# Patient Record
Sex: Male | Born: 1960 | Race: White | Hispanic: No | Marital: Married | Smoking: Never smoker
Health system: Southern US, Community
[De-identification: ages and names within clinical notes are randomized; demographics above are authoritative.]

## PROBLEM LIST (undated history)

## (undated) DIAGNOSIS — I2510 Atherosclerotic heart disease of native coronary artery without angina pectoris: Secondary | ICD-10-CM

## (undated) DIAGNOSIS — Z789 Other specified health status: Secondary | ICD-10-CM

## (undated) DIAGNOSIS — I5032 Chronic diastolic (congestive) heart failure: Secondary | ICD-10-CM

## (undated) DIAGNOSIS — I358 Other nonrheumatic aortic valve disorders: Secondary | ICD-10-CM

## (undated) DIAGNOSIS — I4819 Other persistent atrial fibrillation: Secondary | ICD-10-CM

## (undated) DIAGNOSIS — I429 Cardiomyopathy, unspecified: Secondary | ICD-10-CM

## (undated) DIAGNOSIS — E785 Hyperlipidemia, unspecified: Secondary | ICD-10-CM

## (undated) DIAGNOSIS — I340 Nonrheumatic mitral (valve) insufficiency: Secondary | ICD-10-CM

## (undated) DIAGNOSIS — I10 Essential (primary) hypertension: Secondary | ICD-10-CM

## (undated) DIAGNOSIS — F1010 Alcohol abuse, uncomplicated: Secondary | ICD-10-CM

## (undated) DIAGNOSIS — I77810 Thoracic aortic ectasia: Secondary | ICD-10-CM

## (undated) DIAGNOSIS — I4892 Unspecified atrial flutter: Secondary | ICD-10-CM

## (undated) HISTORY — DX: Atherosclerotic heart disease of native coronary artery without angina pectoris: I25.10

## (undated) HISTORY — DX: Essential (primary) hypertension: I10

## (undated) HISTORY — DX: Nonrheumatic mitral (valve) insufficiency: I34.0

## (undated) HISTORY — DX: Cardiomyopathy, unspecified: I42.9

## (undated) HISTORY — DX: Other specified health status: Z78.9

## (undated) HISTORY — DX: Chronic diastolic (congestive) heart failure: I50.32

## (undated) HISTORY — DX: Other nonrheumatic aortic valve disorders: I35.8

## (undated) HISTORY — DX: Unspecified atrial flutter: I48.92

## (undated) HISTORY — DX: Hyperlipidemia, unspecified: E78.5

## (undated) HISTORY — DX: Alcohol abuse, uncomplicated: F10.10

## (undated) HISTORY — DX: Thoracic aortic ectasia: I77.810

## (undated) HISTORY — DX: Other persistent atrial fibrillation: I48.19

## (undated) HISTORY — PX: TONSILLECTOMY AND ADENOIDECTOMY: SHX28

---

## 2009-12-24 DIAGNOSIS — I10 Essential (primary) hypertension: Secondary | ICD-10-CM | POA: Insufficient documentation

## 2013-08-05 ENCOUNTER — Ambulatory Visit: Payer: Self-pay | Admitting: Family Medicine

## 2013-10-31 ENCOUNTER — Ambulatory Visit: Payer: Self-pay | Admitting: Unknown Physician Specialty

## 2013-10-31 DIAGNOSIS — Z86010 Personal history of colonic polyps: Secondary | ICD-10-CM | POA: Insufficient documentation

## 2013-10-31 HISTORY — PX: COLONOSCOPY: SHX174

## 2013-10-31 LAB — HM COLONOSCOPY

## 2013-11-02 LAB — PATHOLOGY REPORT

## 2014-02-28 IMAGING — CR DG LUMBAR SPINE 2-3V
1 series · 3 of 3 positions shown · non-contrast
Comparison: None.

CLINICAL DATA: Low back pain, worse after several hours of working

EXAM:
LUMBAR SPINE - 2-3 VIEW

[Series 1: ap · 0.17mm/px · 3 of 3 slices shown]
[im 1/3]
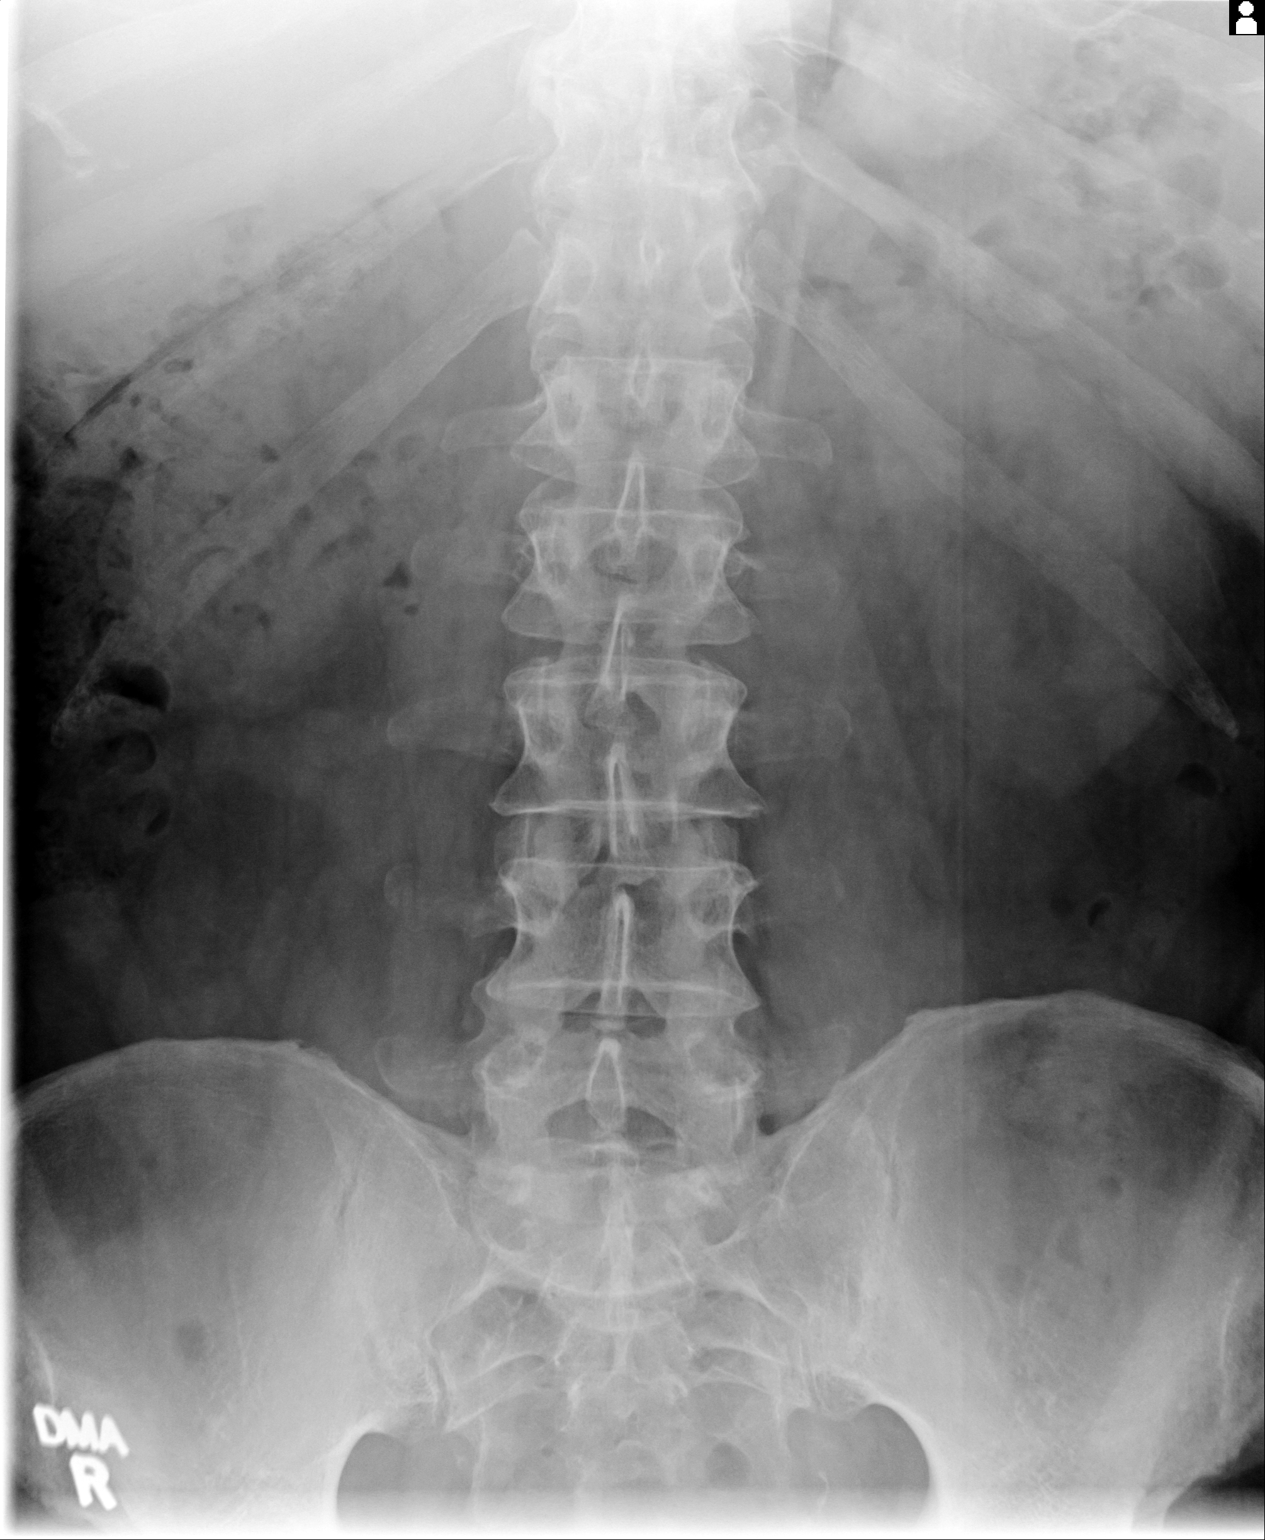
[im 2/3]
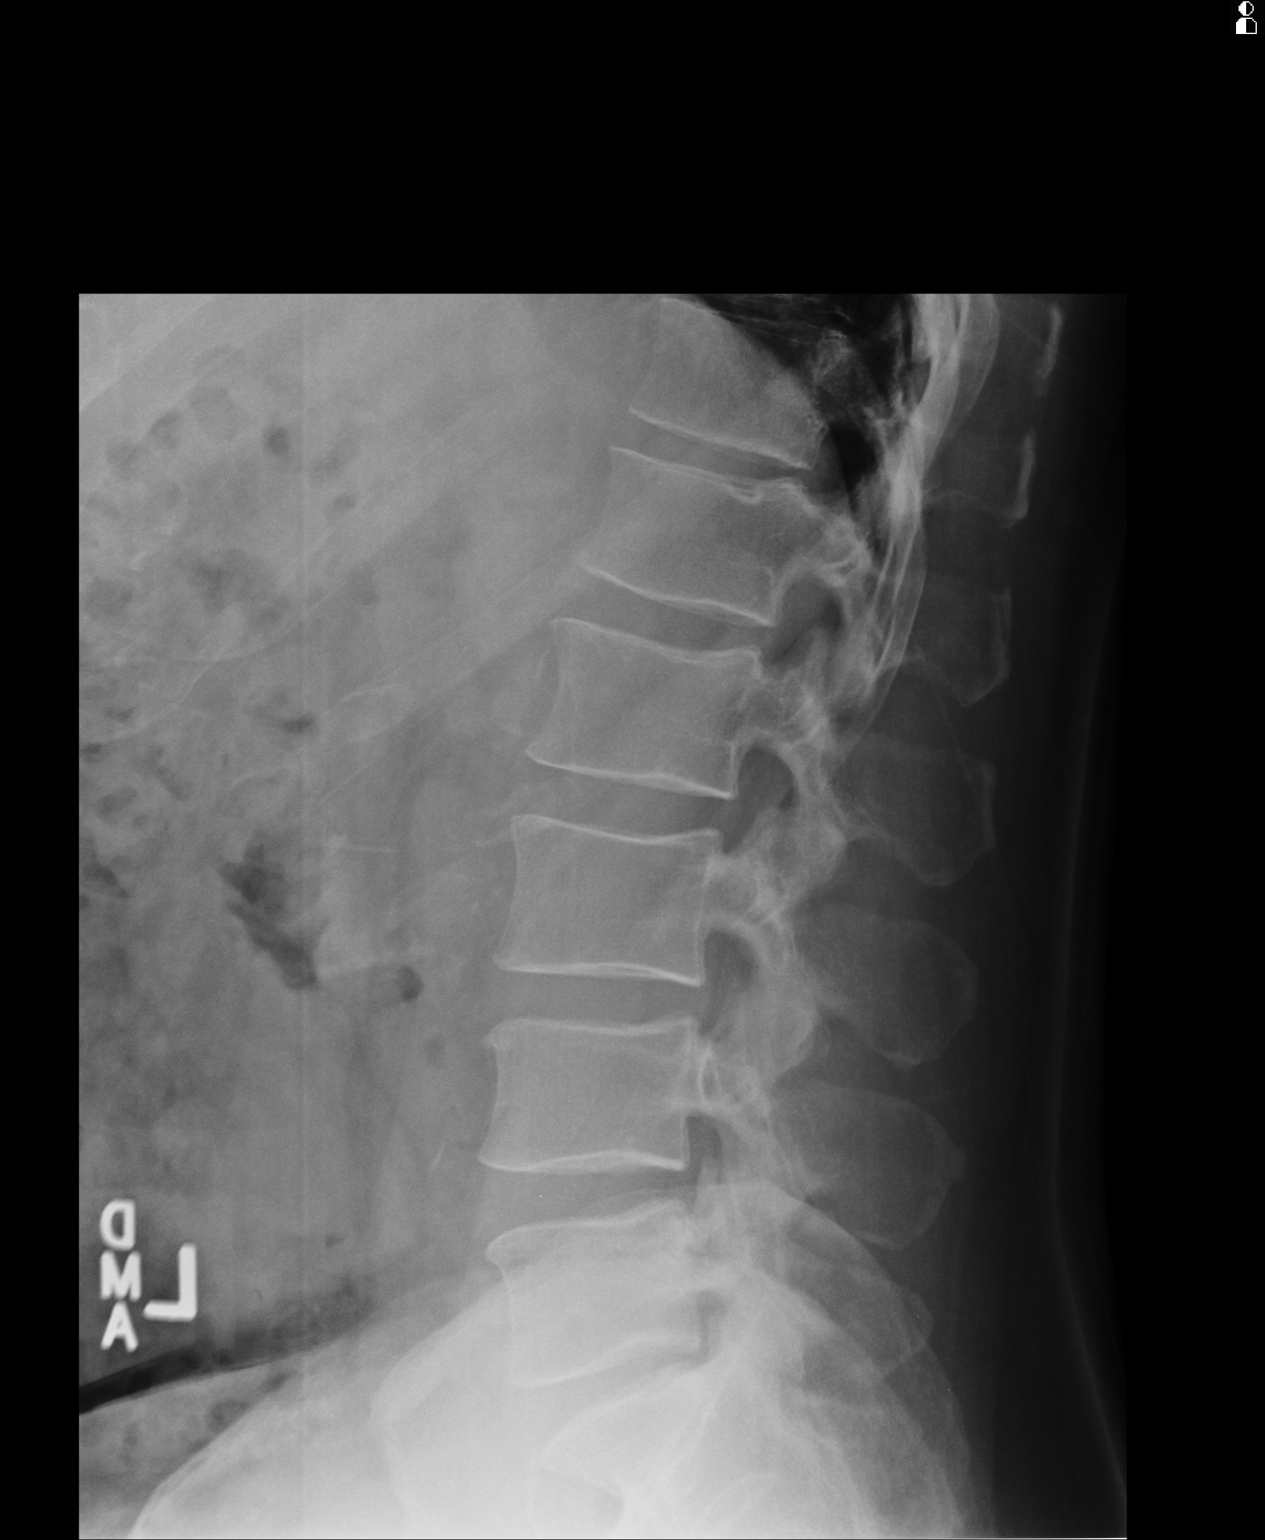
[im 3/3]
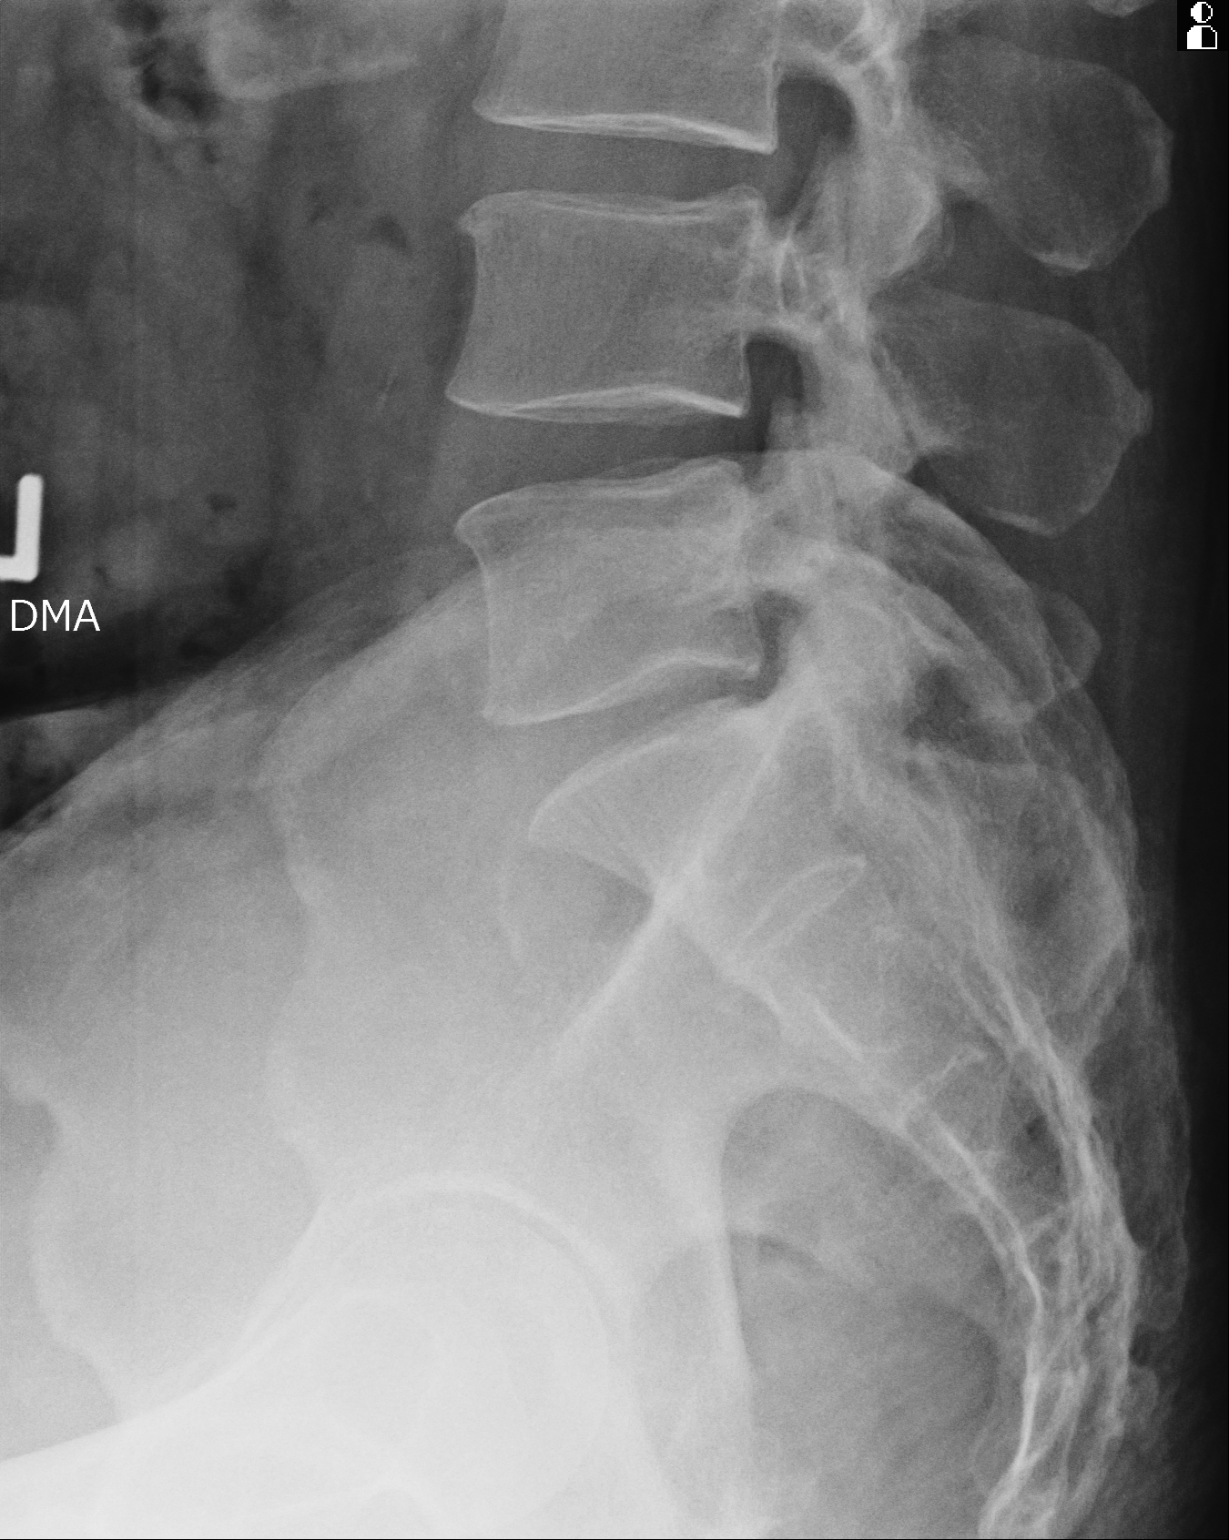

[3 of 3 positions shown; findings below may reference images not displayed]

FINDINGS: There are 5 non rib-bearing lumbar type vertebral bodies.

There is minimal straightening of the expected lumbar lordosis. No
anterolisthesis or retrolisthesis.

Lumbar vertebral body heights are preserved.

There is mild multilevel lumbar spine DDD, worse at L3-L4 with disc
space height loss, endplate irregularity and internally directed
osteophytosis.

Limited visualization of the bilateral SI joints is normal.

Moderate colonic stool burden without evidence of obstruction.
Vascular calcifications. Regional soft tissues are otherwise normal.
IMPRESSION: 1. Mild straightening of expected lumbar lordosis, nonspecific
though could be seen in the setting of muscle spasm.
2. Mild multilevel lumbar spine DDD, worse at L3-L4.

## 2015-02-13 ENCOUNTER — Other Ambulatory Visit: Payer: Self-pay | Admitting: Family Medicine

## 2015-02-13 NOTE — Telephone Encounter (Signed)
Office visit to extract his chart and to f/u hypertension.

## 2015-04-03 NOTE — Telephone Encounter (Signed)
Patient is requesting refills on HCTZ and Bisoprolol-HCTZ. Patient will schedule a follow up visit in November.

## 2015-10-17 ENCOUNTER — Other Ambulatory Visit: Payer: Self-pay | Admitting: Family Medicine

## 2015-10-17 DIAGNOSIS — I10 Essential (primary) hypertension: Secondary | ICD-10-CM

## 2015-10-17 MED ORDER — HYDROCHLOROTHIAZIDE 12.5 MG PO CAPS
12.5000 mg | ORAL_CAPSULE | Freq: Every day | ORAL | Status: DC
Start: 2015-10-17 — End: 2016-02-14

## 2015-10-17 MED ORDER — BISOPROLOL-HYDROCHLOROTHIAZIDE 10-6.25 MG PO TABS: 1.0000 | ORAL_TABLET | Freq: Every day | ORAL | Status: DC

## 2016-02-13 ENCOUNTER — Encounter: Payer: Self-pay | Admitting: Family Medicine

## 2016-02-13 DIAGNOSIS — G8929 Other chronic pain: Secondary | ICD-10-CM

## 2016-02-13 DIAGNOSIS — M545 Low back pain, unspecified: Secondary | ICD-10-CM | POA: Insufficient documentation

## 2016-02-13 DIAGNOSIS — D2239 Melanocytic nevi of other parts of face: Secondary | ICD-10-CM | POA: Insufficient documentation

## 2016-02-13 DIAGNOSIS — L918 Other hypertrophic disorders of the skin: Secondary | ICD-10-CM

## 2016-02-13 DIAGNOSIS — M5440 Lumbago with sciatica, unspecified side: Principal | ICD-10-CM

## 2016-02-13 DIAGNOSIS — M542 Cervicalgia: Secondary | ICD-10-CM | POA: Insufficient documentation

## 2016-02-14 ENCOUNTER — Ambulatory Visit (INDEPENDENT_AMBULATORY_CARE_PROVIDER_SITE_OTHER): Payer: Self-pay | Admitting: Family Medicine

## 2016-02-14 ENCOUNTER — Encounter: Payer: Self-pay | Admitting: Family Medicine

## 2016-02-14 VITALS — BP 172/98 | HR 64 | Temp 97.8°F | Resp 16 | Ht 74.2 in | Wt 250.0 lb

## 2016-02-14 DIAGNOSIS — E785 Hyperlipidemia, unspecified: Secondary | ICD-10-CM

## 2016-02-14 DIAGNOSIS — Z658 Other specified problems related to psychosocial circumstances: Secondary | ICD-10-CM

## 2016-02-14 DIAGNOSIS — I10 Essential (primary) hypertension: Secondary | ICD-10-CM

## 2016-02-14 DIAGNOSIS — F439 Reaction to severe stress, unspecified: Secondary | ICD-10-CM

## 2016-02-14 MED ORDER — AMLODIPINE BESYLATE 10 MG PO TABS: 10.0000 mg | ORAL_TABLET | Freq: Every day | ORAL | Status: DC

## 2016-02-14 MED ORDER — BISOPROLOL-HYDROCHLOROTHIAZIDE 10-6.25 MG PO TABS: 1.0000 | ORAL_TABLET | Freq: Every day | ORAL | Status: DC

## 2016-02-14 MED ORDER — CLONAZEPAM 0.5 MG PO TABS: ORAL_TABLET | ORAL | Status: DC

## 2016-02-14 NOTE — Patient Instructions (Signed)
We will call you with the lab results. Please return for office visit before you run out of the new bp medication.
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
## 2016-03-06 ENCOUNTER — Ambulatory Visit (INDEPENDENT_AMBULATORY_CARE_PROVIDER_SITE_OTHER): Payer: Self-pay | Admitting: Family Medicine

## 2016-03-06 ENCOUNTER — Encounter: Payer: Self-pay | Admitting: Family Medicine

## 2016-03-06 VITALS — BP 128/84 | HR 70 | Temp 98.2°F | Resp 16 | Ht 74.0 in | Wt 245.0 lb

## 2016-03-06 DIAGNOSIS — I10 Essential (primary) hypertension: Secondary | ICD-10-CM

## 2016-03-06 DIAGNOSIS — Z658 Other specified problems related to psychosocial circumstances: Secondary | ICD-10-CM

## 2016-03-06 DIAGNOSIS — F439 Reaction to severe stress, unspecified: Secondary | ICD-10-CM

## 2016-03-06 MED ORDER — AMLODIPINE BESYLATE 10 MG PO TABS: 10.0000 mg | ORAL_TABLET | Freq: Every day | ORAL | Status: DC

## 2016-03-06 NOTE — Patient Instructions (Signed)
Consider Cymbalta for anxiety and chronic pain. Try 1/2 pill of clonazepam for day time use as needed for stress. Call me when you need refill of clonazepam.

## 2016-03-06 NOTE — Progress Notes (Signed)
Subjective:     Patient ID: Stephen [name], male   DOB: 1960-12-07, 55 y.o.   MRN: [id number]  HPI  Chief Complaint  Patient presents with  . Hypertension  States he is tolerating addition of amlodipine well without leg swelling. Also here in f/u of situational stress. States he has used the clonazepam at night and has slept better. Has not tried during the day out of concerns for sleepiness.   Review of Systems     Objective:   Physical Exam  Constitutional: He appears well-developed and well-nourished. No distress.  Cardiovascular: Normal rate and regular rhythm.   Pulmonary/Chest: Breath sounds normal.  Musculoskeletal: He exhibits no edema (of lower extremities).  Psychiatric: He has a normal mood and affect. His behavior is normal.       Assessment:    1. Essential (primary) hypertension: continue bisoprolol - amLODipine (NORVASC) 10 MG tablet; Take 1 tablet (10 mg total) by mouth daily.  Dispense: 30 tablet; Refill: 5  2. Situational stress: Continue clonazepam; to try 1/2 pill during the day as needed.    Plan:    Consider Cymbalta for anxiety and chronic neck/back pain. He wishes to research it and will let me know if he wishes to try.

## 2016-03-07 ENCOUNTER — Other Ambulatory Visit: Payer: Self-pay | Admitting: Family Medicine

## 2016-03-07 DIAGNOSIS — F439 Reaction to severe stress, unspecified: Secondary | ICD-10-CM

## 2016-03-07 MED ORDER — CLONAZEPAM 0.5 MG PO TABS
ORAL_TABLET | ORAL | Status: DC
Start: 2016-03-07 — End: 2016-11-04

## 2016-03-07 NOTE — Telephone Encounter (Signed)
Pt contacted office for refill request on the following medications: clonazePAM (KLONOPIN) 0.5 MG tablet to [facility] on [address] Last RX: 02/14/16 Last OV: 03/06/16 Pt stated that he came in today to advise that he did well taking the medication during the day and wanted to go ahead and request a refill. Please advise. Thanks TNP

## 2016-03-07 NOTE — Telephone Encounter (Signed)
Please call in clonazepam as I have updated in the EMR

## 2016-03-07 NOTE — Telephone Encounter (Signed)
Called into pharmacy and advised pt. Stephen [name], CMA

## 2016-03-08 LAB — COMPREHENSIVE METABOLIC PANEL
ALT: 21 IU/L (ref 0–44)
AST: 17 IU/L (ref 0–40)
Albumin/Globulin Ratio: 1.5 (ref 1.2–2.2)
Albumin: 4.2 g/dL (ref 3.5–5.5)
Alkaline Phosphatase: 59 IU/L (ref 39–117)
BUN/Creatinine Ratio: 18 (ref 9–20)
BUN: 16 mg/dL (ref 6–24)
Bilirubin Total: 0.4 mg/dL (ref 0.0–1.2)
CALCIUM: 9.2 mg/dL (ref 8.7–10.2)
CO2: 24 mmol/L (ref 18–29)
CREATININE: 0.87 mg/dL (ref 0.76–1.27)
Chloride: 100 mmol/L (ref 96–106)
GFR calc Af Amer: 114 mL/min/{1.73_m2} (ref >59)
GFR, EST NON AFRICAN AMERICAN: 99 mL/min/{1.73_m2} (ref >59)
Globulin, Total: 2.8 g/dL (ref 1.5–4.5)
Glucose: 93 mg/dL (ref 65–99)
Potassium: 4.5 mmol/L (ref 3.5–5.2)
Sodium: 140 mmol/L (ref 134–144)
TOTAL PROTEIN: 7 g/dL (ref 6.0–8.5)

## 2016-03-08 LAB — LIPID PANEL
CHOL/HDL RATIO: 4.7 ratio (ref 0.0–5.0)
Cholesterol, Total: 228 mg/dL — ABNORMAL HIGH (ref 100–199)
HDL: 49 mg/dL (ref >39)
LDL CALC: 129 mg/dL — AB (ref 0–99)
TRIGLYCERIDES: 249 mg/dL — AB (ref 0–149)
VLDL CHOLESTEROL CAL: 50 mg/dL — AB (ref 5–40)

## 2016-08-12 ENCOUNTER — Other Ambulatory Visit: Payer: Self-pay | Admitting: Family Medicine

## 2016-08-12 DIAGNOSIS — I10 Essential (primary) hypertension: Secondary | ICD-10-CM

## 2016-08-24 ENCOUNTER — Other Ambulatory Visit: Payer: Self-pay | Admitting: Family Medicine

## 2016-08-24 DIAGNOSIS — I10 Essential (primary) hypertension: Secondary | ICD-10-CM

## 2016-11-04 ENCOUNTER — Other Ambulatory Visit: Payer: Self-pay | Admitting: Family Medicine

## 2016-11-04 DIAGNOSIS — F439 Reaction to severe stress, unspecified: Secondary | ICD-10-CM

## 2016-11-05 NOTE — Telephone Encounter (Signed)
RX called in at [facility] pharmacy  

## 2017-02-21 ENCOUNTER — Other Ambulatory Visit: Payer: Self-pay | Admitting: Family Medicine

## 2017-02-21 DIAGNOSIS — I10 Essential (primary) hypertension: Secondary | ICD-10-CM

## 2017-04-22 ENCOUNTER — Other Ambulatory Visit: Payer: Self-pay | Admitting: Family Medicine

## 2017-04-22 DIAGNOSIS — I10 Essential (primary) hypertension: Secondary | ICD-10-CM

## 2017-05-11 ENCOUNTER — Other Ambulatory Visit: Payer: Self-pay | Admitting: Family Medicine

## 2017-05-11 DIAGNOSIS — F439 Reaction to severe stress, unspecified: Secondary | ICD-10-CM

## 2017-06-22 ENCOUNTER — Encounter: Payer: Self-pay | Admitting: Family Medicine

## 2017-06-22 ENCOUNTER — Ambulatory Visit (INDEPENDENT_AMBULATORY_CARE_PROVIDER_SITE_OTHER): Payer: 59 | Admitting: Family Medicine

## 2017-06-22 VITALS — BP 160/104 | HR 77 | Temp 98.6°F | Resp 16 | Wt 247.8 lb

## 2017-06-22 DIAGNOSIS — Z125 Encounter for screening for malignant neoplasm of prostate: Secondary | ICD-10-CM | POA: Diagnosis not present

## 2017-06-22 DIAGNOSIS — M7062 Trochanteric bursitis, left hip: Secondary | ICD-10-CM

## 2017-06-22 DIAGNOSIS — G8929 Other chronic pain: Secondary | ICD-10-CM | POA: Diagnosis not present

## 2017-06-22 DIAGNOSIS — E782 Mixed hyperlipidemia: Secondary | ICD-10-CM | POA: Diagnosis not present

## 2017-06-22 DIAGNOSIS — M545 Low back pain, unspecified: Secondary | ICD-10-CM | POA: Insufficient documentation

## 2017-06-22 DIAGNOSIS — Z23 Encounter for immunization: Secondary | ICD-10-CM

## 2017-06-22 DIAGNOSIS — I10 Essential (primary) hypertension: Secondary | ICD-10-CM | POA: Diagnosis not present

## 2017-06-22 LAB — LIPID PANEL
Cholesterol: 200 mg/dL — ABNORMAL HIGH (ref <200)
HDL: 48 mg/dL (ref >40)
LDL CHOLESTEROL (CALC): 118 mg/dL — AB
NON-HDL CHOLESTEROL (CALC): 152 mg/dL — AB (ref <130)
TRIGLYCERIDES: 224 mg/dL — AB (ref <150)
Total CHOL/HDL Ratio: 4.2 (calc) (ref <5.0)

## 2017-06-22 LAB — COMPLETE METABOLIC PANEL WITH GFR
AG Ratio: 1.4 (calc) (ref 1.0–2.5)
ALT: 23 U/L (ref 9–46)
AST: 15 U/L (ref 10–35)
Albumin: 4 g/dL (ref 3.6–5.1)
Alkaline phosphatase (APISO): 55 U/L (ref 40–115)
BUN: 12 mg/dL (ref 7–25)
CALCIUM: 9 mg/dL (ref 8.6–10.3)
CO2: 31 mmol/L (ref 20–32)
CREATININE: 0.79 mg/dL (ref 0.70–1.33)
Chloride: 103 mmol/L (ref 98–110)
GFR, EST AFRICAN AMERICAN: 117 mL/min/{1.73_m2} (ref >=60)
GFR, EST NON AFRICAN AMERICAN: 101 mL/min/{1.73_m2} (ref >=60)
GLUCOSE: 97 mg/dL (ref 65–99)
Globulin: 2.9 g/dL (calc) (ref 1.9–3.7)
Potassium: 4 mmol/L (ref 3.5–5.3)
Sodium: 138 mmol/L (ref 135–146)
TOTAL PROTEIN: 6.9 g/dL (ref 6.1–8.1)
Total Bilirubin: 0.6 mg/dL (ref 0.2–1.2)

## 2017-06-22 LAB — PSA: PSA: 0.5 ng/mL (ref <=4.0)

## 2017-06-22 MED ORDER — DULOXETINE HCL 30 MG PO CPEP: 30.0000 mg | ORAL_CAPSULE | Freq: Every day | ORAL | 1 refills | Status: DC

## 2017-06-22 MED ORDER — BISOPROLOL-HYDROCHLOROTHIAZIDE 10-6.25 MG PO TABS: ORAL_TABLET | ORAL | 3 refills | Status: DC

## 2017-06-22 NOTE — Patient Instructions (Signed)
We will call you with the lab results. 

## 2017-06-22 NOTE — Progress Notes (Signed)
Subjective:     Patient ID: Stephen [name], male   DOB: Feb 03, 1961, 56 y.o.   MRN: [id number]  HPI  Chief Complaint  Patient presents with  . Hypertension    Patient returns to [facility] today for follow up visit, patient was last seen on 03/06/16 b/p at visit was 128/84. Patient report systolic readings outside  has been between 295-284 and diastolic readins in the 13K. He reports good compliance on mediation but would like changing medication to help his blood pressure improve.   . Stress    Pateint returns for follow up he was last seen 03/06/16, patient reports good compliance on Clonazepam and states that he takes medication at night but it causes him to be drowsy. Patient admits that he is under a lot of stress from work and would like to discuss medication he can take during the daytime.  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    Review of Systems  Respiratory: Negative for shortness of breath.   Cardiovascular: Negative for chest pain and palpitations.  Musculoskeletal: Positive for back pain (chronic midline low back pain. Uses clonazepam at night to help sleep due to pain in neck and back. Prior x-rays of neck and back c/w DDD.).       Also reports chronic right sided hip pain; "I can't sleep on my left side." Will take an occasional ibuprofen for his chronic pains.  Neurological: Positive for headaches (chronic mild headaches; rare migraines).       Objective:   Physical Exam  Constitutional: He appears well-developed and well-nourished. No distress.  Cardiovascular: Normal rate and regular rhythm.   Pulmonary/Chest: Breath sounds normal.  Musculoskeletal: He exhibits no edema.  Tender over his left trochanteric area       Assessment:    1. Need for influenza vaccination - Flu Vaccine QUAD 36+ mos IM  2. Mixed hyperlipidemia - Lipid panel  3. Chronic midline low back pain without sciatica - DULoxetine (CYMBALTA) 30 MG capsule; Take 1 capsule (30 mg  total) by mouth daily.  Dispense: 30 capsule; Refill: 1  4. Trochanteric bursitis of left hip - Ambulatory referral to Orthopedic Surgery  5. Essential hypertension - bisoprolol-hydrochlorothiazide (ZIAC) 10-6.25 MG tablet; Two tablets daily  Dispense: 180 tablet; Refill: 3 - COMPLETE METABOLIC PANEL WITH GFR             6. Screening for prostate cancer - PSA    Plan:    Further f/u pending lab work and in 3 weeks.

## 2017-06-24 ENCOUNTER — Telehealth: Payer: Self-pay

## 2017-06-24 NOTE — Telephone Encounter (Signed)
-----   Message from Carmon Ginsberg, [place] sent at 06/23/2017  7:34 AM EDT ----- Labs are ok-mildly elevated cholesterol. Your cardiovascular risk will go down when we get control of your blood pressure.

## 2017-06-24 NOTE — Telephone Encounter (Signed)
[id number] 

## 2017-06-30 NOTE — Telephone Encounter (Signed)
[id number] 

## 2017-07-02 NOTE — Telephone Encounter (Signed)
[id number] 

## 2017-07-06 NOTE — Telephone Encounter (Signed)
[id number] 

## 2017-07-08 NOTE — Telephone Encounter (Signed)
Letter mailed to patients home. KW 

## 2017-07-13 ENCOUNTER — Encounter: Payer: Self-pay | Admitting: Family Medicine

## 2017-07-13 ENCOUNTER — Ambulatory Visit (INDEPENDENT_AMBULATORY_CARE_PROVIDER_SITE_OTHER): Payer: 59 | Admitting: Family Medicine

## 2017-07-13 VITALS — BP 134/86 | HR 59 | Temp 98.0°F | Resp 16 | Wt 244.0 lb

## 2017-07-13 DIAGNOSIS — I10 Essential (primary) hypertension: Secondary | ICD-10-CM | POA: Diagnosis not present

## 2017-07-13 MED ORDER — AMLODIPINE BESYLATE 10 MG PO TABS: 10.0000 mg | ORAL_TABLET | Freq: Every day | ORAL | 3 refills | Status: DC

## 2017-07-13 NOTE — Progress Notes (Signed)
Subjective:     Patient ID: Stephen [name], male   DOB: March 14, 1961, 56 y.o.   MRN: [id number]  HPI  Chief Complaint  Patient presents with  . Hypertension    Patient returns to office today for 3 week follow up, last office visit was 06/22/17 blood pressure in house was 160/104. Patient was encouraged to continue Bisoprolol-HCTZ 10-6.25mg , patient reports good compliance and tolerance on medication.   Wishes one year supply of medication. 10 year c.v.risk recalculated as 7.3%.   Review of Systems     Objective:   Physical Exam  Constitutional: He appears well-developed and well-nourished. No distress.  Cardiovascular: Normal rate and regular rhythm.  Pulmonary/Chest: Breath sounds normal.  Musculoskeletal: He exhibits no edema (of lower extremities).       Assessment:    1. Essential (primary) hypertension: continue increased dose of bisoprolol - amLODipine (NORVASC) 10 MG tablet; Take 1 tablet (10 mg total) daily by mouth.  Dispense: 90 tablet; Refill: 3    Plan:    follow up in one year or as needed.

## 2017-08-18 ENCOUNTER — Other Ambulatory Visit: Payer: Self-pay | Admitting: Family Medicine

## 2017-08-18 DIAGNOSIS — M545 Low back pain, unspecified: Secondary | ICD-10-CM

## 2017-08-18 DIAGNOSIS — G8929 Other chronic pain: Secondary | ICD-10-CM

## 2017-09-07 ENCOUNTER — Other Ambulatory Visit: Payer: Self-pay | Admitting: Family Medicine

## 2017-09-07 DIAGNOSIS — F439 Reaction to severe stress, unspecified: Secondary | ICD-10-CM

## 2017-09-13 ENCOUNTER — Other Ambulatory Visit: Payer: Self-pay | Admitting: Family Medicine

## 2017-09-13 DIAGNOSIS — F439 Reaction to severe stress, unspecified: Secondary | ICD-10-CM

## 2017-09-14 NOTE — Telephone Encounter (Signed)
Please check on this, it should have been called in on 1/7.

## 2017-09-14 NOTE — Telephone Encounter (Signed)
Called pharmacy, and verbally gave order on phone for prescription. KW

## 2017-10-17 ENCOUNTER — Other Ambulatory Visit: Payer: Self-pay | Admitting: Family Medicine

## 2017-10-17 DIAGNOSIS — F439 Reaction to severe stress, unspecified: Secondary | ICD-10-CM

## 2017-10-17 DIAGNOSIS — I10 Essential (primary) hypertension: Secondary | ICD-10-CM

## 2017-10-18 ENCOUNTER — Other Ambulatory Visit: Payer: Self-pay | Admitting: Family Medicine

## 2017-10-18 DIAGNOSIS — F439 Reaction to severe stress, unspecified: Secondary | ICD-10-CM

## 2017-10-18 MED ORDER — CLONAZEPAM 0.5 MG PO TABS: ORAL_TABLET | ORAL | 0 refills | Status: DC

## 2017-10-18 NOTE — Telephone Encounter (Signed)
This medication refilled with a year's refills on 06/22/17. Please confirm with the pharmacy.

## 2017-10-19 NOTE — Telephone Encounter (Signed)
Disregard refill request I contact [facility] they state that prescription was denied at there location because it is filled at the other [place] on [address] that use to be [address] [facility]. I called patient and left voicemail to notify him that there are now two [place] on [address], and gave him phone number to correct pharmacy where prescription is waiting for pick up. KW

## 2018-01-20 ENCOUNTER — Telehealth: Payer: Self-pay | Admitting: Family Medicine

## 2018-01-20 NOTE — Telephone Encounter (Signed)
Pt is requesting refill on DULoxetine (CYMBALTA) 30 MG capsule and clonazePAM (KLONOPIN) 0.5 MG tablet he would like this sent ot [facility]

## 2018-01-20 NOTE — Telephone Encounter (Signed)
Last OV 07/13/2017 Next OV 05/04/18 Last RF: Duloxetine-08/18/17 and Clonazepam- 10/18/17  Please refer to note on 10/17/17 regarding patient medications

## 2018-01-21 ENCOUNTER — Other Ambulatory Visit: Payer: Self-pay | Admitting: Family Medicine

## 2018-01-21 DIAGNOSIS — M545 Low back pain: Principal | ICD-10-CM

## 2018-01-21 DIAGNOSIS — F439 Reaction to severe stress, unspecified: Secondary | ICD-10-CM

## 2018-01-21 DIAGNOSIS — G8929 Other chronic pain: Secondary | ICD-10-CM

## 2018-01-21 MED ORDER — CLONAZEPAM 0.5 MG PO TABS: ORAL_TABLET | ORAL | 0 refills | Status: DC

## 2018-01-21 MED ORDER — DULOXETINE HCL 30 MG PO CPEP: 30.0000 mg | ORAL_CAPSULE | Freq: Every day | ORAL | 1 refills | Status: DC

## 2018-01-21 NOTE — Telephone Encounter (Signed)
done

## 2018-02-23 ENCOUNTER — Other Ambulatory Visit: Payer: Self-pay | Admitting: Family Medicine

## 2018-02-23 DIAGNOSIS — F439 Reaction to severe stress, unspecified: Secondary | ICD-10-CM

## 2018-02-24 ENCOUNTER — Other Ambulatory Visit: Payer: Self-pay | Admitting: Family Medicine

## 2018-02-24 DIAGNOSIS — F439 Reaction to severe stress, unspecified: Secondary | ICD-10-CM

## 2018-02-24 NOTE — Telephone Encounter (Signed)
Pt needs refill Klonopin  0.5 mg  [facility]  [address]

## 2018-05-04 ENCOUNTER — Ambulatory Visit
Admission: RE | Admit: 2018-05-04 | Discharge: 2018-05-04 | Disposition: A | Discharge status: Home / Self Care | Payer: 59 | Source: Ambulatory Visit | Attending: Family Medicine | Admitting: Family Medicine

## 2018-05-04 ENCOUNTER — Encounter: Payer: Self-pay | Admitting: Family Medicine

## 2018-05-04 ENCOUNTER — Other Ambulatory Visit: Payer: Self-pay | Admitting: Family Medicine

## 2018-05-04 ENCOUNTER — Ambulatory Visit: Payer: 59 | Admitting: Family Medicine

## 2018-05-04 VITALS — BP 130/86 | HR 85 | Temp 98.4°F | Resp 16 | Wt 226.2 lb

## 2018-05-04 DIAGNOSIS — M542 Cervicalgia: Secondary | ICD-10-CM

## 2018-05-04 DIAGNOSIS — F439 Reaction to severe stress, unspecified: Secondary | ICD-10-CM | POA: Diagnosis not present

## 2018-05-04 DIAGNOSIS — M1288 Other specific arthropathies, not elsewhere classified, other specified site: Secondary | ICD-10-CM | POA: Insufficient documentation

## 2018-05-04 DIAGNOSIS — M545 Low back pain: Secondary | ICD-10-CM

## 2018-05-04 DIAGNOSIS — G8929 Other chronic pain: Secondary | ICD-10-CM | POA: Diagnosis not present

## 2018-05-04 DIAGNOSIS — M50322 Other cervical disc degeneration at C5-C6 level: Secondary | ICD-10-CM | POA: Insufficient documentation

## 2018-05-04 DIAGNOSIS — M4802 Spinal stenosis, cervical region: Secondary | ICD-10-CM | POA: Diagnosis not present

## 2018-05-04 MED ORDER — PREDNISONE 20 MG PO TABS: ORAL_TABLET | ORAL | 0 refills | Status: DC

## 2018-05-04 MED ORDER — DULOXETINE HCL 30 MG PO CPEP: ORAL_CAPSULE | ORAL | 0 refills | Status: DC

## 2018-05-04 NOTE — Patient Instructions (Addendum)
We will call you with the x-ray results. Don't take ibuprofen or Aleve while on Prednisone. May take Tylenol up to 3000 mg/day.

## 2018-05-04 NOTE — Progress Notes (Signed)
  Subjective:     Patient ID: Stephen [name], male   DOB: 01-31-1961, 57 y.o.   MRN: [id number] Chief Complaint  Patient presents with  . Hypertension    Patient returns to office today for follow up from 07/13/17, patients blood pressure at last visit was 134/86. Patient reports that he does not feel like his medication has been helping control his blood pressure. Patient reports increased headaches and back pain.  . Stress    Patient returns to office for follow up from 03/06/16 he reports good compliance on Cymbalta and Clonazepam but states that he feels that his body has adjusted to medication and it is not helping with symptom control. Patient reports emotional outburst and crying episodes due to increased stress from back pain causing patient to change jobs.    HPI Reports neck pain radiating down his back to his shoulder blade and numbness and tingling at times in his left arm affecting all of his fingers. 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 Reports compliance with duloxetine. Accompanied by his wife today  Review of Systems     Objective:   Physical Exam  Constitutional: He appears well-developed and well-nourished. No distress.  Musculoskeletal:  Muscle strength in lower extremities 5/5. Cervical ROM limited in extension and lateral movement with increased pain in the left posterior cervical area. Grip strength 5/5.       Assessment:    1. Situational stress: increased Cymbalta to 60 mg/day  2. Neck pain: add prednisone - DG Cervical Spine Complete; Future     Plan:    Further f/u pending x-ray results with probable orthopedic referral. Stop nsaid's while on prednisone. May use Tylenol up to 3000 mg/day.

## 2018-06-01 ENCOUNTER — Other Ambulatory Visit: Payer: Self-pay | Admitting: Family Medicine

## 2018-06-01 ENCOUNTER — Telehealth: Payer: Self-pay | Admitting: Family Medicine

## 2018-06-01 DIAGNOSIS — I10 Essential (primary) hypertension: Secondary | ICD-10-CM

## 2018-06-01 DIAGNOSIS — F439 Reaction to severe stress, unspecified: Secondary | ICD-10-CM

## 2018-06-01 MED ORDER — GABAPENTIN 300 MG PO CAPS: 300.0000 mg | ORAL_CAPSULE | Freq: Three times a day (TID) | ORAL | 0 refills | Status: DC

## 2018-06-01 NOTE — Telephone Encounter (Signed)
Have sent in gabapentin. Awaiting call back on efficacy of prednisone rx.

## 2018-06-01 NOTE — Telephone Encounter (Signed)
Pt calling stating he was seen for neck pain the first of September and was referred to a [facility] and they can't see him until October 30 and would like to know if something can be called in for his pain until he can get to [facility] to be seen. Pt states he has been out of work for 2 days because of his pain. Please advise pt. Thanks CC

## 2018-06-01 NOTE — Telephone Encounter (Signed)
Please advise. KW 

## 2018-06-01 NOTE — Telephone Encounter (Signed)
Pt is checking back on the status of some pain medication he is needing to go back to work. Pt has been out now for 2 days.  Pt is afraid of loosing job due to pain and not being able to be seen by referred doctor until the end of the month.  Please call pt back to let him know asap.  Thanks, [employer]

## 2018-06-01 NOTE — Telephone Encounter (Signed)
[id number] 

## 2018-06-02 NOTE — Telephone Encounter (Signed)
See below

## 2018-06-02 NOTE — Telephone Encounter (Signed)
Patient advised he states that he took the prednisone but it did not help, patient advised to start Gabapentin. KW

## 2018-06-02 NOTE — Telephone Encounter (Signed)
[id number] 

## 2018-06-29 ENCOUNTER — Other Ambulatory Visit: Payer: Self-pay | Admitting: Family Medicine

## 2018-06-29 DIAGNOSIS — I10 Essential (primary) hypertension: Secondary | ICD-10-CM

## 2018-07-02 ENCOUNTER — Other Ambulatory Visit: Payer: Self-pay | Admitting: Family Medicine

## 2018-08-02 ENCOUNTER — Other Ambulatory Visit: Payer: Self-pay | Admitting: Family Medicine

## 2018-08-02 DIAGNOSIS — M545 Low back pain, unspecified: Secondary | ICD-10-CM

## 2018-08-02 DIAGNOSIS — G8929 Other chronic pain: Secondary | ICD-10-CM

## 2018-11-01 ENCOUNTER — Other Ambulatory Visit: Payer: Self-pay | Admitting: Family Medicine

## 2018-11-01 DIAGNOSIS — F439 Reaction to severe stress, unspecified: Secondary | ICD-10-CM

## 2018-11-01 NOTE — Telephone Encounter (Signed)
[facility] faxed refill request for the following medications:  clonazePAM (KLONOPIN) 0.5 MG tablet   Date written: 06/01/2018  Last dispensed: 10/04/2018  Please advise.

## 2018-11-02 MED ORDER — CLONAZEPAM 0.5 MG PO TABS: 0.2500 mg | ORAL_TABLET | Freq: Two times a day (BID) | ORAL | 5 refills | Status: DC | PRN

## 2018-11-27 IMAGING — CR DG CERVICAL SPINE COMPLETE 4+V
1 series · 5 of 5 positions shown · non-contrast
Comparison: 08/05/2013 cervical spine radiograph

CLINICAL DATA: Radicular left neck pain radiating to the left
shoulder for 1 year. No reported recent injury.

EXAM:
CERVICAL SPINE - COMPLETE 4+ VIEW

[Series 1: dg cervical spine complete · 0.14mm/px · 5 of 5 slices shown]
[im 1/5]
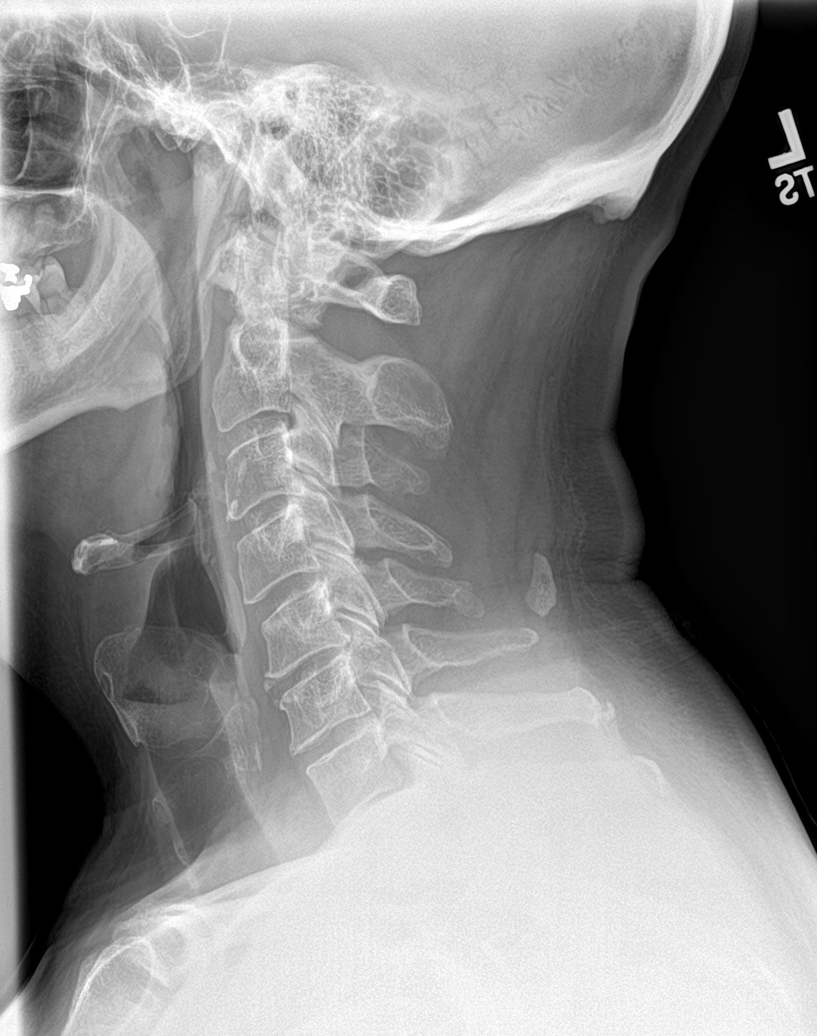
[im 2/5]
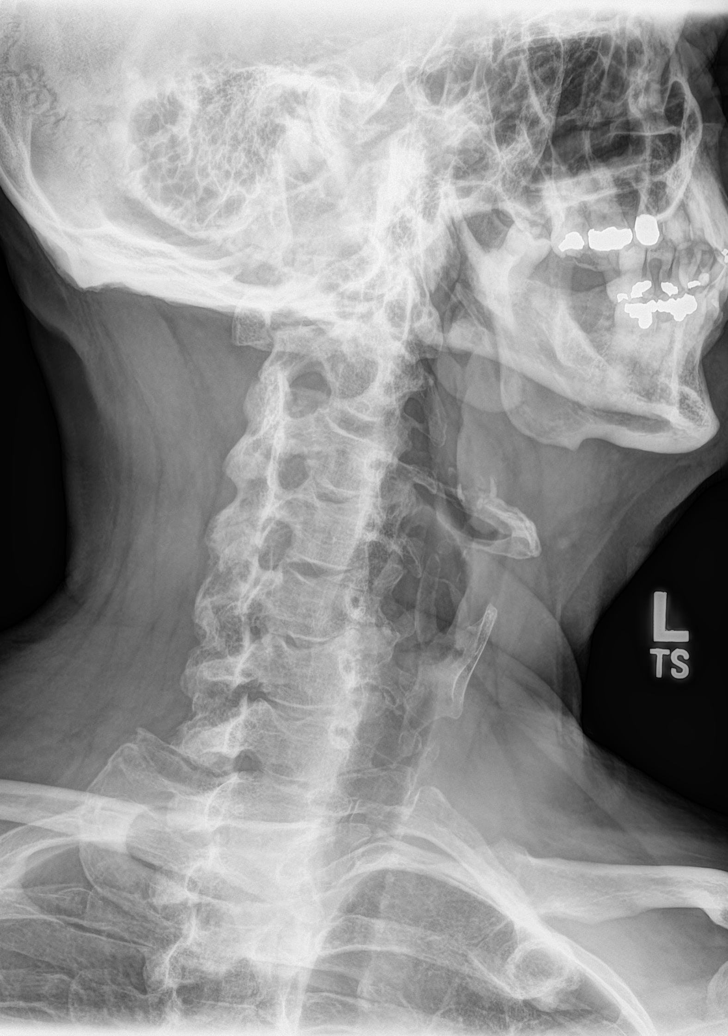
[im 3/5]
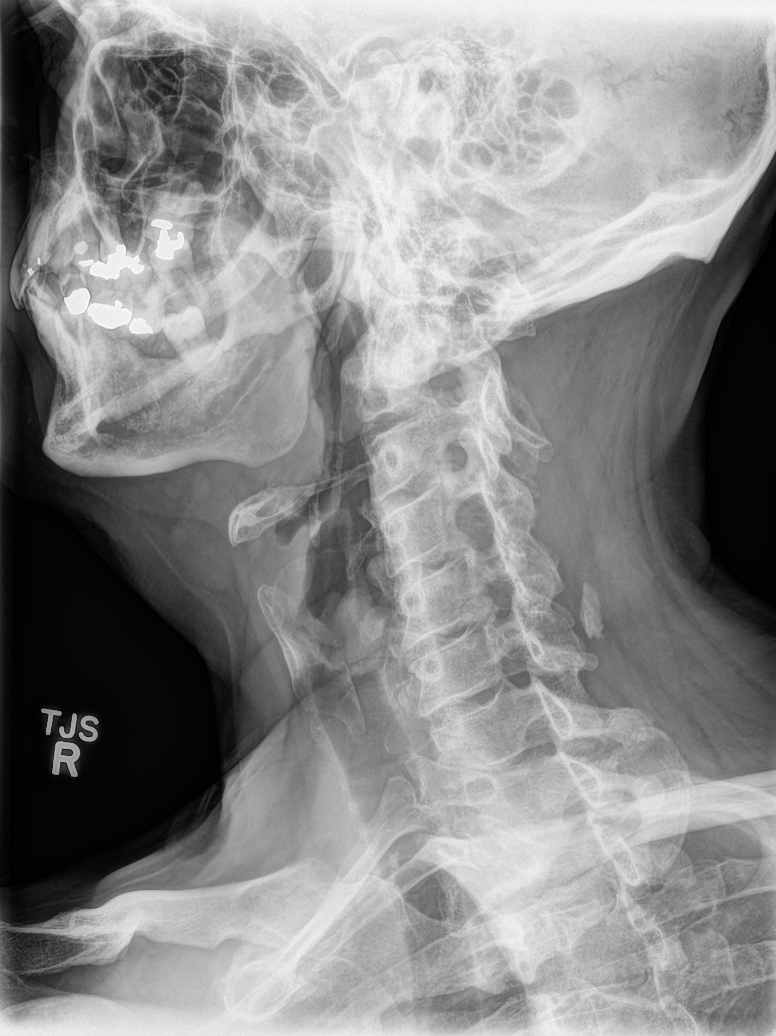
[im 4/5]
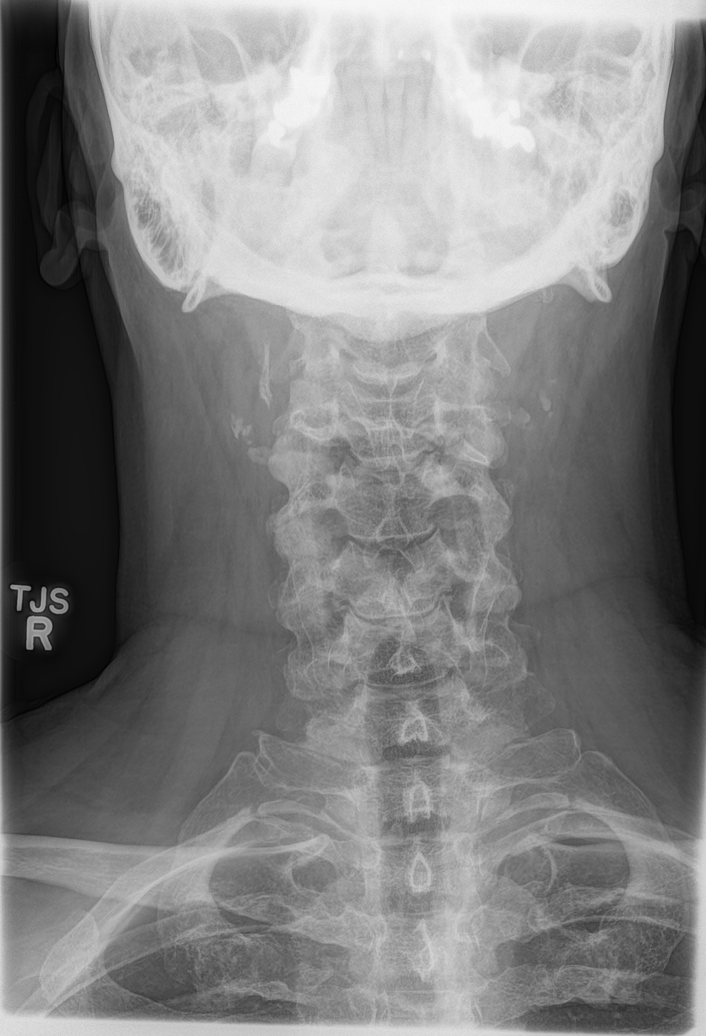
[im 5/5]
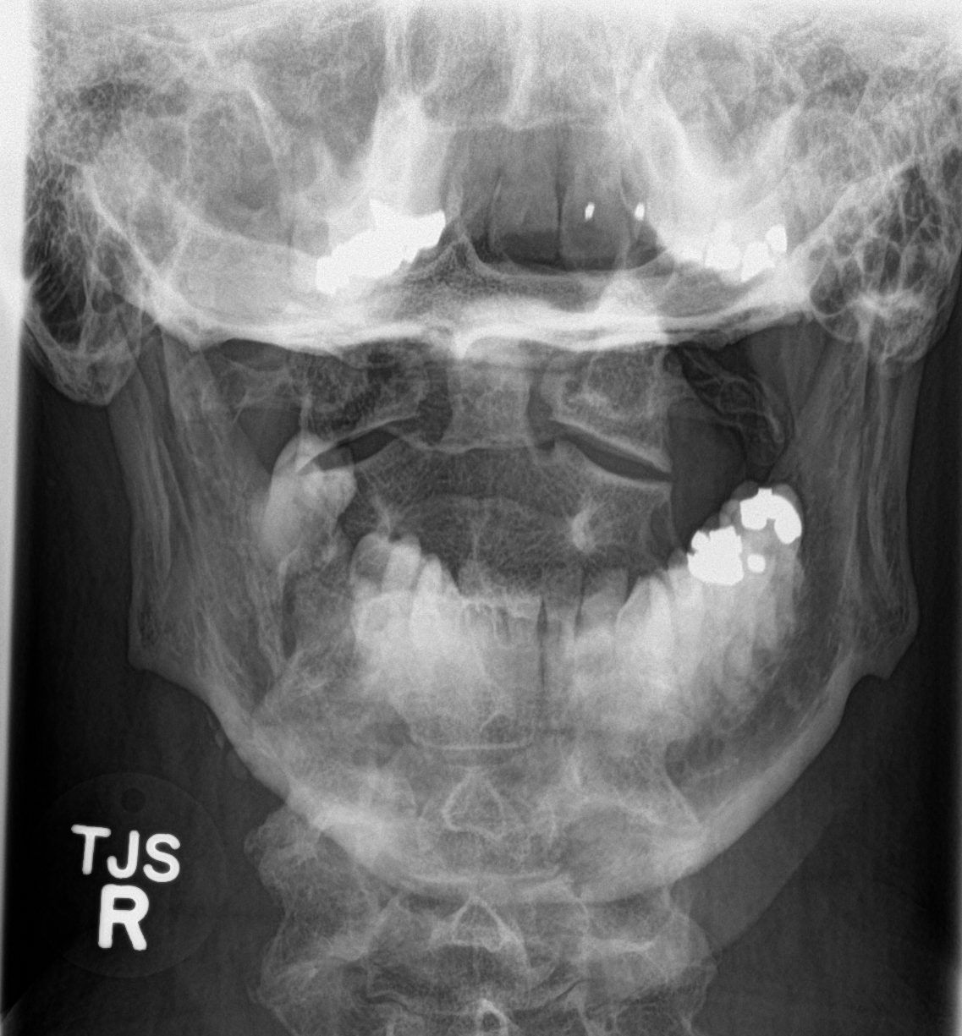

[5 of 5 positions shown; findings below may reference images not displayed]

FINDINGS: On the lateral view the cervical spine is visualized to the level of
lower C7 endplate, with nonvisualization of the C7-T1 level. Mild
straightening of the cervical spine. Pre-vertebral soft tissues are
within normal limits. No fracture is detected in the cervical spine.
Dens is well positioned between the lateral masses of C1. Mild
degenerative disc disease at C5-6 and C6-7. Stable minimal 2 mm
retrolisthesis at C5-6. No acute subluxation. Mild bilateral facet
arthropathy. Mild degenerative foraminal stenosis on the right at
C6-7. No significant degenerate foraminal stenosis on the left. No
aggressive-appearing focal osseous lesions. Focal ossification of
the inferior nuchal ligament.
IMPRESSION: 1. Mild degenerative disc disease in the lower cervical spine.
2. Stable minimal 2 mm retrolisthesis at C5-6.
3. Mild bilateral facet arthropathy with mild degenerative foraminal
stenosis on the right at C6-7.

## 2019-03-02 ENCOUNTER — Other Ambulatory Visit: Payer: Self-pay | Admitting: Family Medicine

## 2019-03-02 DIAGNOSIS — G8929 Other chronic pain: Secondary | ICD-10-CM

## 2019-03-02 DIAGNOSIS — M545 Low back pain, unspecified: Secondary | ICD-10-CM

## 2019-03-02 MED ORDER — DULOXETINE HCL 30 MG PO CPEP: ORAL_CAPSULE | ORAL | 0 refills | Status: DC

## 2019-03-02 NOTE — Telephone Encounter (Signed)
One month supply sent to pharmacy.  Recommend he sets up f/u appt with Dr Caryn Section

## 2019-03-02 NOTE — Telephone Encounter (Signed)
Please review. Dr. Maralyn Sago patient now, previously Bob's patient. LOV was on 05/04/2018

## 2019-03-02 NOTE — Telephone Encounter (Signed)
[facility] faxed refill request for the following medications:  DULoxetine (CYMBALTA) 30 MG capsule   Please advise.

## 2019-03-28 ENCOUNTER — Other Ambulatory Visit: Payer: Self-pay | Admitting: Family Medicine

## 2019-03-28 DIAGNOSIS — G8929 Other chronic pain: Secondary | ICD-10-CM

## 2019-04-26 ENCOUNTER — Other Ambulatory Visit: Payer: Self-pay | Admitting: Family Medicine

## 2019-04-26 DIAGNOSIS — G8929 Other chronic pain: Secondary | ICD-10-CM

## 2019-05-26 ENCOUNTER — Other Ambulatory Visit: Payer: Self-pay | Admitting: Family Medicine

## 2019-05-26 DIAGNOSIS — I10 Essential (primary) hypertension: Secondary | ICD-10-CM

## 2019-05-26 DIAGNOSIS — F439 Reaction to severe stress, unspecified: Secondary | ICD-10-CM

## 2019-05-26 MED ORDER — AMLODIPINE BESYLATE 10 MG PO TABS: 10.0000 mg | ORAL_TABLET | Freq: Every day | ORAL | 3 refills | Status: DC

## 2019-05-26 NOTE — Telephone Encounter (Signed)
[facility] faxed refill request for the following medications:  amLODipine (NORVASC) 10 MG tablet   Please advise.

## 2019-08-01 ENCOUNTER — Other Ambulatory Visit: Payer: Self-pay | Admitting: Family Medicine

## 2019-08-01 DIAGNOSIS — I10 Essential (primary) hypertension: Secondary | ICD-10-CM

## 2019-08-01 MED ORDER — BISOPROLOL-HYDROCHLOROTHIAZIDE 10-6.25 MG PO TABS: ORAL_TABLET | ORAL | 0 refills | Status: DC

## 2019-08-01 NOTE — Telephone Encounter (Signed)
Contacted patient to schedule a follow up appointment. Patient states he is unemployed. He does not have any income or insurance to come in for a OV. Please advise.

## 2019-08-01 NOTE — Telephone Encounter (Signed)
[facility] faxed refill request for the following medications:  bisoprolol-hydrochlorothiazide [facility]) 10-6.25 MG tablet  Last Rx: 06/30/2018 LOV: 05/04/2018 with Mikki Santee Please advise. Thanks TNP

## 2019-08-24 ENCOUNTER — Telehealth: Payer: Self-pay | Admitting: Family Medicine

## 2019-08-24 ENCOUNTER — Other Ambulatory Visit: Payer: Self-pay | Admitting: Family Medicine

## 2019-08-24 DIAGNOSIS — F439 Reaction to severe stress, unspecified: Secondary | ICD-10-CM

## 2019-08-24 DIAGNOSIS — G8929 Other chronic pain: Secondary | ICD-10-CM

## 2019-08-24 NOTE — Telephone Encounter (Signed)
[facility] faxed refill request for the following medications:  gabapentin (NEURONTIN) 300 MG capsule   Please advise.  Thanks, [employer]

## 2019-08-25 MED ORDER — GABAPENTIN 300 MG PO CAPS: 300.0000 mg | ORAL_CAPSULE | Freq: Three times a day (TID) | ORAL | 1 refills | Status: DC

## 2019-09-23 ENCOUNTER — Other Ambulatory Visit: Payer: Self-pay | Admitting: Family Medicine

## 2019-09-23 DIAGNOSIS — M545 Low back pain, unspecified: Secondary | ICD-10-CM

## 2019-09-23 DIAGNOSIS — G8929 Other chronic pain: Secondary | ICD-10-CM

## 2019-09-23 DIAGNOSIS — I10 Essential (primary) hypertension: Secondary | ICD-10-CM

## 2019-09-23 DIAGNOSIS — F439 Reaction to severe stress, unspecified: Secondary | ICD-10-CM

## 2019-10-22 ENCOUNTER — Other Ambulatory Visit: Payer: Self-pay | Admitting: Family Medicine

## 2019-10-22 DIAGNOSIS — F439 Reaction to severe stress, unspecified: Secondary | ICD-10-CM

## 2019-10-22 DIAGNOSIS — G8929 Other chronic pain: Secondary | ICD-10-CM

## 2019-10-22 DIAGNOSIS — M545 Low back pain, unspecified: Secondary | ICD-10-CM

## 2019-10-22 NOTE — Telephone Encounter (Signed)
Requested medication (s) are due for refill today: gabapentin, yes  Requested medication (s) are on the active medication list: yes  Last refill:  09/23/19  Future visit scheduled: no valid encounter within last 6 months  Notes to clinic:  no valid encounter within last 6 months  Requested medication (s) are due for refill today: duloxetine, yes  Requested medication (s) are on the active medication list: yes  Last refill:  08/24/2019  Future visit scheduled: no valid encounter within last 6 months  Notes to clinic:  no valid encounter within last 6 months  Requested medication (s) are due for refill today: clonazepam, yes  Requested medication (s) are on the active medication list: yes  Last refill:    Future visit scheduled: no  Notes to clinic:  not delegated; no valid encounter within last 6 months  Requested Prescriptions  Pending Prescriptions Disp Refills   gabapentin (NEURONTIN) 300 MG capsule [Pharmacy Med Name: GABAPENTIN 300 MG CAP] 90 capsule 1    Sig: TAKE 1 CAPSULE BY MOUTH 3 TIMES A DAY      Neurology: Anticonvulsants - gabapentin Failed - 10/22/2019  9:15 AM      Failed - Valid encounter within last 12 months    Recent Outpatient Visits           1 year ago [place], [place], [place]   2 years ago Essential (primary) hypertension   [place], [place], [place]   2 years ago Need for influenza vaccination   [place], [place], [place]   3 years ago Essential (primary) hypertension   [place], [place], [place]   3 years ago Essential (primary) hypertension   [place], [place]                DULoxetine (CYMBALTA) 30 MG capsule [Pharmacy Med Name: DULOXETINE HCL 30 MG CAP] 60 capsule 2    Sig: TAKE 2 CAPSULES BY MOUTH ONCE DAILY  - PER MD PLEASE SCHEDULE OFFICE VISIT FOR FOLLOW UP      Psychiatry: Antidepressants - SNRI  Failed - 10/22/2019  9:15 AM      Failed - Valid encounter within last 6 months    Recent Outpatient Visits           1 year ago [place], [place], [place]   2 years ago Essential (primary) hypertension   [place], [place], [place]   2 years ago Need for influenza vaccination   [place], [place], [place]   3 years ago Essential (primary) hypertension   [place], [place], [place]   3 years ago Essential (primary) hypertension   [place], [place]              Passed - Last BP in normal range    BP Readings from Last 1 Encounters:  05/04/18 130/86            clonazePAM (KLONOPIN) 0.5 MG tablet [Pharmacy Med Name: CLONAZEPAM 0.5 MG TAB] 60 tablet     Sig: TAKE 1/2 TO 1 TABLET (0.25-0.5MG  TOTAL) BY MOUTH TWICE DAILY AS NEEDED FOR ANXIETY      Not Delegated - Psychiatry:  Anxiolytics/Hypnotics Failed - 10/22/2019  9:15 AM      Failed - This refill cannot be delegated      Failed - Urine Drug Screen completed in last  360 days.      Failed - Valid encounter within last 6 months    Recent Outpatient Visits           1 year ago [place], [place]   2 years ago Essential (primary) hypertension   [place], [place]   2 years ago Need for influenza vaccination   [place], [place]   3 years ago Essential (primary) hypertension   [place], [place], [place]   3 years ago Essential (primary) hypertension   [place], [place], [place]

## 2019-11-22 ENCOUNTER — Other Ambulatory Visit: Payer: Self-pay | Admitting: Family Medicine

## 2019-11-22 DIAGNOSIS — F439 Reaction to severe stress, unspecified: Secondary | ICD-10-CM

## 2019-11-22 DIAGNOSIS — G8929 Other chronic pain: Secondary | ICD-10-CM

## 2019-11-22 DIAGNOSIS — M545 Low back pain, unspecified: Secondary | ICD-10-CM

## 2019-12-22 ENCOUNTER — Other Ambulatory Visit: Payer: Self-pay | Admitting: Family Medicine

## 2019-12-22 DIAGNOSIS — F439 Reaction to severe stress, unspecified: Secondary | ICD-10-CM

## 2019-12-22 DIAGNOSIS — I10 Essential (primary) hypertension: Secondary | ICD-10-CM

## 2019-12-22 DIAGNOSIS — G8929 Other chronic pain: Secondary | ICD-10-CM

## 2019-12-22 NOTE — Telephone Encounter (Signed)
Requested medication (s) are due for refill today: yes  Requested medication (s) are on the active medication list: yes  Last refill:  11/23/2019  Future visit scheduled: no  Notes to clinic:  patient has not followed up for appointment    Requested Prescriptions  Pending Prescriptions Disp Refills   gabapentin (NEURONTIN) 300 MG capsule [Pharmacy Med Name: GABAPENTIN 300 MG CAP] 90 capsule 1    Sig: TAKE 1 CAPSULE BY MOUTH 3 TIMES A DAY      Neurology: Anticonvulsants - gabapentin Failed - 12/22/2019  1:30 PM      Failed - Valid encounter within last 12 months    Recent Outpatient Visits           1 year ago [place], [place]   2 years ago Essential (primary) hypertension   [place], [place]   2 years ago Need for influenza vaccination   [place], [place]   3 years ago Essential (primary) hypertension   [place], [place], [place]   3 years ago Essential (primary) hypertension   [facility], [place], [place]                bisoprolol-hydrochlorothiazide (ZIAC) 10-6.25 MG tablet [Pharmacy Med Name: BISOPROLOL-HCTZ 10-6.25 MG TAB] 180 tablet 0    Sig: TAKE 2 TABLETS BY MOUTH ONCE A DAY      Cardiovascular: Beta Blocker + Diuretic Combos Failed - 12/22/2019  1:30 PM      Failed - K in normal range and within 180 days    Potassium  Date Value Ref Range Status  06/22/2017 4.0 3.5 - 5.3 mmol/L Final          Failed - Na in normal range and within 180 days    Sodium  Date Value Ref Range Status  06/22/2017 138 135 - 146 mmol/L Final  03/07/2016 140 134 - 144 mmol/L Final          Failed - Cr in normal range and within 180 days    Creat  Date Value Ref Range Status  06/22/2017 0.79 0.70 - 1.33 mg/dL Final    Comment:    For patients >13 years of age, the reference limit for Creatinine is approximately 13% higher for  people identified as African-American. .           Failed - Ca in normal range and within 180 days    Calcium  Date Value Ref Range Status  06/22/2017 9.0 8.6 - 10.3 mg/dL Final          Failed - Valid encounter within last 6 months    Recent Outpatient Visits           1 year ago [place], [place], [place]   2 years ago Essential (primary) hypertension   [place], [place], [place]   2 years ago Need for influenza vaccination   [facility] [place], [place], [place]   3 years ago Essential (primary) hypertension   [place], [place], [place]   3 years ago Essential (primary) hypertension   [place], [place], [place] - Patient is not pregnant      Passed - Last BP in normal range    BP Readings from Last 1 Encounters:  05/04/18 130/86  Passed - Last Heart Rate in normal range    Pulse Readings from Last 1 Encounters:  05/04/18 85            clonazePAM (KLONOPIN) 0.5 MG tablet [Pharmacy Med Name: CLONAZEPAM 0.5 MG TAB] 30 tablet     Sig: TAKE 1/2 TO 1 TABLET (0.25-0.5MG  TOTAL) BY MOUTH TWICE DAILY AS NEEDED FOR ANXIETY      Not Delegated - Psychiatry:  Anxiolytics/Hypnotics Failed - 12/22/2019  1:30 PM      Failed - This refill cannot be delegated      Failed - Urine Drug Screen completed in last 360 days.      Failed - Valid encounter within last 6 months    Recent Outpatient Visits           1 year ago [place], [place]   2 years ago Essential (primary) hypertension   [place], [place]   2 years ago Need for influenza vaccination   [place], [place]   3 years ago Essential (primary) hypertension   [place], [place], [place]   3 years ago Essential (primary) hypertension   [place], [place], [place]

## 2019-12-22 NOTE — Telephone Encounter (Signed)
Requested medication (s) are due for refill today: yes  Requested medication (s) are on the active medication list: yes  Last refill:  11/23/2019  Future visit scheduled: no  Notes to clinic:  Patient has not schedule appointment as advised  Review for refill   Requested Prescriptions  Pending Prescriptions Disp Refills   DULoxetine (CYMBALTA) 30 MG capsule [Pharmacy Med Name: DULOXETINE HCL 30 MG CAP] 60 capsule 1    Sig: TAKE 2 CAPSULES BY MOUTH ONCE DAILY  - PER MD [place]      Psychiatry: Antidepressants - SNRI Failed - 12/22/2019  1:27 PM      Failed - Valid encounter within last 6 months    Recent Outpatient Visits           1 year ago [facility] [place], [place], [place]   2 years ago Essential (primary) hypertension   [facility] [place], [place], [place]   2 years ago Need for influenza vaccination   [facility] [place], [place], [place]   3 years ago Essential (primary) hypertension   [place], [place], [place]   3 years ago Essential (primary) hypertension   [place], [place]              Passed - Last BP in normal range    BP Readings from Last 1 Encounters:  05/04/18 130/86

## 2019-12-27 ENCOUNTER — Other Ambulatory Visit: Payer: Self-pay | Admitting: Family Medicine

## 2019-12-27 DIAGNOSIS — I10 Essential (primary) hypertension: Secondary | ICD-10-CM

## 2019-12-27 DIAGNOSIS — F439 Reaction to severe stress, unspecified: Secondary | ICD-10-CM

## 2019-12-27 NOTE — Telephone Encounter (Signed)
Requested medication (s) are due for refill today: yes  Requested medication (s) are on the active medication list: yes  Last refill:  10/31/2019  Future visit scheduled: no   Notes to clinic:  Attempted to contact patient no answer and vm is full Patient is due for follow up   Requested Prescriptions  Pending Prescriptions Disp Refills   bisoprolol-hydrochlorothiazide (ZIAC) 10-6.25 MG tablet [Pharmacy Med Name: BISOPROLOL-HCTZ 10-6.25 MG TAB] 180 tablet 0    Sig: TAKE 2 TABLETS BY MOUTH ONCE A DAY      Cardiovascular: Beta Blocker + Diuretic Combos Failed - 12/27/2019  9:00 AM      Failed - K in normal range and within 180 days    Potassium  Date Value Ref Range Status  06/22/2017 4.0 3.5 - 5.3 mmol/L Final          Failed - Na in normal range and within 180 days    Sodium  Date Value Ref Range Status  06/22/2017 138 135 - 146 mmol/L Final  03/07/2016 140 134 - 144 mmol/L Final          Failed - Cr in normal range and within 180 days    Creat  Date Value Ref Range Status  06/22/2017 0.79 0.70 - 1.33 mg/dL Final    Comment:    For patients >36 years of age, the reference limit for Creatinine is approximately 13% higher for people identified as African-American. .           Failed - Ca in normal range and within 180 days    Calcium  Date Value Ref Range Status  06/22/2017 9.0 8.6 - 10.3 mg/dL Final          Failed - Valid encounter within last 6 months    Recent Outpatient Visits           1 year ago [place], [place]   2 years ago Essential (primary) hypertension   [place], [place], [place]   2 years ago Need for influenza vaccination   [place], [place]   3 years ago Essential (primary) hypertension   [place], [place], [place]   3 years ago Essential (primary) hypertension   [place], [place], [place] - Patient is not pregnant      Passed - Last BP in normal range    BP Readings from Last 1 Encounters:  05/04/18 130/86          Passed - Last Heart Rate in normal range    Pulse Readings from Last 1 Encounters:  05/04/18 85            clonazePAM (KLONOPIN) 0.5 MG tablet [Pharmacy Med Name: CLONAZEPAM 0.5 MG TAB] 30 tablet     Sig: TAKE 1/2 TO 1 TABLET (0.25-0.5MG  TOTAL) BY MOUTH TWICE DAILY AS NEEDED FOR ANXIETY      Not Delegated - Psychiatry:  Anxiolytics/Hypnotics Failed - 12/27/2019  9:00 AM      Failed - This refill cannot be delegated      Failed - Urine Drug Screen completed in last 360 days.      Failed - Valid encounter within last 6 months    Recent Outpatient Visits           1 year ago [place]   2 years ago Essential (primary) hypertension   [place], [place]   2 years ago Need for influenza vaccination   [place], [place]   3 years ago Essential (primary) hypertension   [place], [place], [place]   3 years ago Essential (primary) hypertension   [place], [place], [place]

## 2020-01-16 NOTE — Progress Notes (Signed)
Established patient visit   Patient: Stephen [name]   DOB: 08-21-1961   59 y.o. Male  MRN: [id number] Visit Date: 01/17/2020  Today's healthcare provider: Lelon Huh, MD   Chief Complaint  Patient presents with  . Hypertension  . Hyperlipidemia  . Situational stress   Mertie Moores as a scribe for Lelon Huh, MD.,have documented all relevant documentation on the behalf of Lelon Huh, MD,as directed by  Lelon Huh, MD while in the presence of Lelon Huh, MD.  Subjective    HPI Hypertension, follow-up  BP Readings from Last 3 Encounters:  01/17/20 117/67  05/04/18 130/86  07/13/17 134/86   Wt Readings from Last 3 Encounters:  01/17/20 218 lb 9.6 oz (99.2 kg)  05/04/18 226 lb 3.4 oz (102.6 kg)  07/13/17 244 lb (110.7 kg)     He was last seen for hypertension 07/13/2017  BP at that visit was 134/86. Management since that visit includes increased Amlodipine to 10 mg.  He reports good compliance with treatment. He is not having side effects.  He is following a Regular diet. He is exercising. He does not smoke.  Use of agents associated with hypertension: NSAIDS.   Outside blood pressures are being checked at home. Symptoms: No chest pain No chest pressure  No palpitations No syncope  No dyspnea No orthopnea  No paroxysmal nocturnal dyspnea No lower extremity edema   Pertinent labs: Lab Results  Component Value Date   CHOL 200 (H) 06/22/2017   HDL 48 06/22/2017   LDLCALC 118 (H) 06/22/2017   TRIG 224 (H) 06/22/2017   CHOLHDL 4.2 06/22/2017   Lab Results  Component Value Date   NA 138 06/22/2017   K 4.0 06/22/2017   CREATININE 0.79 06/22/2017   GFRNONAA 101 06/22/2017   GFRAA 117 06/22/2017   GLUCOSE 97 06/22/2017     The 10-year ASCVD risk score Mikey Bussing DC Jr., et al., 2013) is: 6.9%   --------------------------------------------------------------------------------------------------- Lipid/Cholesterol, Follow-up  Last  lipid panel Other pertinent labs  Lab Results  Component Value Date   CHOL 200 (H) 06/22/2017   HDL 48 06/22/2017   LDLCALC 118 (H) 06/22/2017   TRIG 224 (H) 06/22/2017   CHOLHDL 4.2 06/22/2017   Lab Results  Component Value Date   ALT 23 06/22/2017   AST 15 06/22/2017     He was last seen for this 07/13/2017  Management since that visit includes no change.  He reports good compliance with treatment. He is not having side effects.   Symptoms: No chest pain No chest pressure/discomfort  No dyspnea No lower extremity edema  No numbness or tingling of extremity No orthopnea  No palpitations No paroxysmal nocturnal dyspnea  No speech difficulty No syncope   Current diet: in general, a "healthy" diet   Current exercise: walking  The 10-year ASCVD risk score Mikey Bussing DC Jr., et al., 2013) is: 6.9%  --------------------------------------------------------------------------------------------------- Follow up for Situational Stress:   The patient was last seen for this 05/04/2018  Changes made at last visit include increased Cymbalta to 60 mg.  He reports good compliance with treatment. He feels that condition is stable. He is not having side effects.   He also reports long history of long back pain since accident in childhood, but has been much worse the last 4 months after [place]. He has tried several OTC pain relievers and every tried muscle relaxers, percocets and vicodin off the street and nothing has  helps. Pain is in lower spine and left of the spine. Is severe at night and making it difficult to sleep.  He has been drinking much more alcohol to control the pain, usually several beers and a few shots of liqueur every night. He also has spasm type pain in his neck that wakes him at night.  -----------------------------------------------------------------------------------------    Medications: Outpatient Medications Prior to Visit  Medication Sig    . amLODipine (NORVASC) 10 MG tablet Take 1 tablet (10 mg total) by mouth daily.  Marland Kitchen aspirin 81 MG tablet Take 1 tablet by mouth daily.  . bisoprolol-hydrochlorothiazide (ZIAC) 10-6.25 MG tablet TAKE 2 TABLETS BY MOUTH ONCE A DAY  . clonazePAM (KLONOPIN) 0.5 MG tablet TAKE 1/2 TO 1 TABLET (0.25-0.5MG  TOTAL) BY MOUTH TWICE DAILY AS NEEDED FOR ANXIETY  . DULoxetine (CYMBALTA) 30 MG capsule TAKE 2 CAPSULES BY MOUTH ONCE DAILY  - PER MD PLEASE SCHEDULE OFFICE VISIT FOR FOLLOW UP  . gabapentin (NEURONTIN) 300 MG capsule TAKE 1 CAPSULE BY MOUTH 3 TIMES A DAY  . niacin 500 MG tablet Take 1 tablet by mouth daily.  . OMEGA-3 FATTY ACIDS PO Take 1 capsule by mouth daily.   No facility-administered medications prior to visit.    Review of Systems  Constitutional: Negative.   Respiratory: Negative.   Cardiovascular: Negative.   Musculoskeletal: Positive for back pain.  Psychiatric/Behavioral: Negative.       Objective    BP 117/67 (BP Location: Right Arm, Patient Position: Sitting, Cuff Size: Normal)   Pulse (!) 57   Temp (!) 96.8 F (36 C) (Temporal)   Ht 6\' 2"  (1.88 m)   Wt 218 lb 9.6 oz (99.2 kg)   BMI 28.07 kg/m    Physical Exam   General: Appearance:    Well developed, well nourished male in no acute distress  Eyes:    PERRL, conjunctiva/corneas clear, EOM's intact       Lungs:     Clear to auscultation bilaterally, respirations unlabored  Heart:    Bradycardic. Normal rhythm. No murmurs, rubs, or gallops.   MS:   All extremities are intact. Moderate tenderness of lumbar spine and left para-spinous muscles.   Neurologic:   Awake, alert, oriented x 3. No apparent focal neurological           defect.       No results found for any visits on 01/17/20.  Assessment & Plan     1. Chronic bilateral low back pain without sciatica Worse since fall about 4 months ago.  - DG Lumbar Spine Complete; Future -start  predniSONE (DELTASONE) 10 MG tablet; 6 tablets for 2 days, then 5 for 2  days, then 4 for 2 days, then 3 for 2 days, then 2 for 2 days, then 1 for 2 days.  Dispense: 42 tablet; Refill: 0 Start - methocarbamol (ROBAXIN) 500 MG tablet; Take one tablet up to three times a day and two at bedtime  Dispense: 60 tablet; Refill: 0 -double  gabapentin (NEURONTIN) to 600 MG tablet; Take 1 tablet (600 mg total) by mouth 3 (three) times daily.  Dispense: 90 tablet; Refill: 1  2. Benign essential HTN Well controlled.  Continue current medications.   - CBC with Differential - Comprehensive Metabolic Panel (CMET) - TSH  3. Mixed hyperlipidemia On niacin. Statin may be safer for his stomach considering excessive alcohol ingestion, although may be some concerns for liver toxicity. See how labs look before making any changes.  - Lipid  Profile  4. Situational stress Stable on clonazepam and duloxetine.   5. Encounter for screening for HIV  - HIV Antibody (routine testing w rflx)  6. Need for hepatitis C screening test  - Hepatitis C Antibody   No follow-ups on file.      The entirety of the information documented in the History of Present Illness, Review of Systems and Physical Exam were personally obtained by me. Portions of this information were initially documented by the CMA and reviewed by me for thoroughness and accuracy.      Lelon Huh, MD  [facility] [phone] (phone) [phone] (fax)  [place]

## 2020-01-17 ENCOUNTER — Encounter: Payer: Self-pay | Admitting: Family Medicine

## 2020-01-17 ENCOUNTER — Ambulatory Visit (INDEPENDENT_AMBULATORY_CARE_PROVIDER_SITE_OTHER): Payer: Self-pay | Admitting: Family Medicine

## 2020-01-17 ENCOUNTER — Other Ambulatory Visit: Payer: Self-pay

## 2020-01-17 VITALS — BP 117/67 | HR 57 | Temp 96.8°F | Ht 74.0 in | Wt 218.6 lb

## 2020-01-17 DIAGNOSIS — Z1159 Encounter for screening for other viral diseases: Secondary | ICD-10-CM

## 2020-01-17 DIAGNOSIS — F439 Reaction to severe stress, unspecified: Secondary | ICD-10-CM

## 2020-01-17 DIAGNOSIS — M545 Low back pain: Secondary | ICD-10-CM

## 2020-01-17 DIAGNOSIS — Z114 Encounter for screening for human immunodeficiency virus [HIV]: Secondary | ICD-10-CM

## 2020-01-17 DIAGNOSIS — I10 Essential (primary) hypertension: Secondary | ICD-10-CM

## 2020-01-17 DIAGNOSIS — E782 Mixed hyperlipidemia: Secondary | ICD-10-CM

## 2020-01-17 DIAGNOSIS — G8929 Other chronic pain: Secondary | ICD-10-CM

## 2020-01-17 MED ORDER — PREDNISONE 10 MG PO TABS: ORAL_TABLET | ORAL | 0 refills | Status: AC

## 2020-01-17 MED ORDER — GABAPENTIN 600 MG PO TABS: 600.0000 mg | ORAL_TABLET | Freq: Three times a day (TID) | ORAL | 1 refills | Status: DC

## 2020-01-17 MED ORDER — METHOCARBAMOL 500 MG PO TABS: ORAL_TABLET | ORAL | 0 refills | Status: DC

## 2020-01-17 NOTE — Patient Instructions (Addendum)
.   Please review the attached list of medications and notify my office if there are any errors.   . Please bring all of your medications to every appointment so we can make sure that our medication list is the same as yours.   Stephen [name] to the [facility] on [facility] for lumbar spine Xray  . Please go to the lab draw station in [address] on the second floor of [facility]  when you are fasting for 8 hours. Normal hours are 8:00am to 12:30pm and 1:30pm to 4:00pm Monday through Friday

## 2020-01-18 ENCOUNTER — Other Ambulatory Visit: Payer: Self-pay

## 2020-01-18 ENCOUNTER — Ambulatory Visit
Admission: RE | Admit: 2020-01-18 | Discharge: 2020-01-18 | Disposition: A | Discharge status: Home / Self Care | Payer: BC Managed Care – PPO | Attending: Family Medicine | Admitting: Family Medicine

## 2020-01-18 ENCOUNTER — Ambulatory Visit
Admission: RE | Admit: 2020-01-18 | Discharge: 2020-01-18 | Disposition: A | Discharge status: Home / Self Care | Payer: BC Managed Care – PPO | Source: Ambulatory Visit | Attending: Family Medicine | Admitting: Family Medicine

## 2020-01-18 DIAGNOSIS — G8929 Other chronic pain: Secondary | ICD-10-CM | POA: Diagnosis present

## 2020-01-18 DIAGNOSIS — M545 Low back pain, unspecified: Secondary | ICD-10-CM

## 2020-01-19 LAB — COMPREHENSIVE METABOLIC PANEL
ALT: 15 IU/L (ref 0–44)
AST: 20 IU/L (ref 0–40)
Albumin/Globulin Ratio: 1.3 (ref 1.2–2.2)
Albumin: 3.7 g/dL — ABNORMAL LOW (ref 3.8–4.9)
Alkaline Phosphatase: 57 IU/L (ref 48–121)
BUN/Creatinine Ratio: 15 (ref 9–20)
BUN: 11 mg/dL (ref 6–24)
Bilirubin Total: 0.3 mg/dL (ref 0.0–1.2)
CO2: 24 mmol/L (ref 20–29)
Calcium: 9.2 mg/dL (ref 8.7–10.2)
Chloride: 100 mmol/L (ref 96–106)
Creatinine, Ser: 0.74 mg/dL — ABNORMAL LOW (ref 0.76–1.27)
GFR calc Af Amer: 118 mL/min/{1.73_m2} (ref >59)
GFR calc non Af Amer: 102 mL/min/{1.73_m2} (ref >59)
Globulin, Total: 2.8 g/dL (ref 1.5–4.5)
Glucose: 91 mg/dL (ref 65–99)
Potassium: 4 mmol/L (ref 3.5–5.2)
Sodium: 139 mmol/L (ref 134–144)
Total Protein: 6.5 g/dL (ref 6.0–8.5)

## 2020-01-19 LAB — CBC WITH DIFFERENTIAL/PLATELET
Basophils Absolute: 0 10*3/uL (ref 0.0–0.2)
Basos: 1 %
EOS (ABSOLUTE): 0.1 10*3/uL (ref 0.0–0.4)
Eos: 2 %
Hematocrit: 42.5 % (ref 37.5–51.0)
Hemoglobin: 14.8 g/dL (ref 13.0–17.7)
Immature Grans (Abs): 0 10*3/uL (ref 0.0–0.1)
Immature Granulocytes: 0 %
Lymphocytes Absolute: 1.8 10*3/uL (ref 0.7–3.1)
Lymphs: 35 %
MCH: 29.8 pg (ref 26.6–33.0)
MCHC: 34.8 g/dL (ref 31.5–35.7)
MCV: 86 fL (ref 79–97)
Monocytes Absolute: 0.5 10*3/uL (ref 0.1–0.9)
Monocytes: 9 %
Neutrophils Absolute: 2.8 10*3/uL (ref 1.4–7.0)
Neutrophils: 53 %
Platelets: 197 10*3/uL (ref 150–450)
RBC: 4.96 x10E6/uL (ref 4.14–5.80)
RDW: 12.3 % (ref 11.6–15.4)
WBC: 5.2 10*3/uL (ref 3.4–10.8)

## 2020-01-19 LAB — HEPATITIS C ANTIBODY: Hep C Virus Ab: <0.1 s/co ratio (ref 0.0–0.9)

## 2020-01-19 LAB — LIPID PANEL
Chol/HDL Ratio: 2.8 ratio (ref 0.0–5.0)
Cholesterol, Total: 187 mg/dL (ref 100–199)
HDL: 66 mg/dL (ref >39)
LDL Chol Calc (NIH): 99 mg/dL (ref 0–99)
Triglycerides: 125 mg/dL (ref 0–149)
VLDL Cholesterol Cal: 22 mg/dL (ref 5–40)

## 2020-01-19 LAB — HIV ANTIBODY (ROUTINE TESTING W REFLEX): HIV Screen 4th Generation wRfx: NONREACTIVE

## 2020-01-19 LAB — TSH: TSH: 1.36 u[IU]/mL (ref 0.450–4.500)

## 2020-01-21 ENCOUNTER — Other Ambulatory Visit: Payer: Self-pay | Admitting: Family Medicine

## 2020-01-21 DIAGNOSIS — G8929 Other chronic pain: Secondary | ICD-10-CM

## 2020-01-21 DIAGNOSIS — F439 Reaction to severe stress, unspecified: Secondary | ICD-10-CM

## 2020-01-21 NOTE — Telephone Encounter (Signed)
Requested medication (s) are due for refill today: yes  Requested medication (s) are on the active medication list: yes  Last refill:  12/27/19  Future visit scheduled: yes  Notes to clinic:  med not delegated to NT to RF   Requested Prescriptions  Pending Prescriptions Disp Refills   clonazePAM (KLONOPIN) 0.5 MG tablet [Pharmacy Med Name: CLONAZEPAM 0.5 MG TAB] 30 tablet     Sig: TAKE 1/2 TO 1 TABLET (0.25-0.5MG  TOTAL) BY MOUTH TWICE DAILY AS NEEDED FOR ANXIETY      Not Delegated - Psychiatry:  Anxiolytics/Hypnotics Failed - 01/21/2020 11:27 AM      Failed - This refill cannot be delegated      Failed - Urine Drug Screen completed in last 360 days.      Passed - Valid encounter within last 6 months    Recent Outpatient Visits           4 days ago Chronic bilateral low back pain without sciatica   [facility] Birdie Sons, [place]   1 year ago [place], [place]   2 years ago Essential (primary) hypertension   [place], [place]   2 years ago Need for influenza vaccination   [place], [place]   3 years ago Essential (primary) hypertension   [place], [place], [place]       Future Appointments             In 1 month Fisher, Kirstie Peri, MD [facility], [facility]             Signed Prescriptions Disp Refills   DULoxetine (CYMBALTA) 30 MG capsule 180 capsule 0    Sig: TAKE 2 CAPSULES BY MOUTH ONCE DAILY  - PER MD PLEASE SCHEDULE OFFICE VISIT FOR FOLLOW UP      Psychiatry: Antidepressants - SNRI Passed - 01/21/2020 11:27 AM      Passed - Last BP in normal range    BP Readings from Last 1 Encounters:  01/17/20 117/67          Passed - Valid encounter within last 6 months    Recent Outpatient Visits           4 days ago Chronic bilateral low back pain without sciatica   [facility] Birdie Sons, [place]    1 year ago [place], [place]   2 years ago Essential (primary) hypertension   [place], [place], [place]   2 years ago Need for influenza vaccination   [facility] [place], [place], [place]   3 years ago Essential (primary) hypertension   [place], [place], [place]       Future Appointments             In 1 month Fisher, Kirstie Peri, MD [facility]             Refused Prescriptions Disp Refills   gabapentin (NEURONTIN) 300 MG capsule [Pharmacy Med Name: GABAPENTIN 300 MG CAP] 90 capsule     Sig: TAKE 1 CAPSULE BY MOUTH 3 TIMES A DAY      Neurology: Anticonvulsants - gabapentin Passed - 01/21/2020 11:27 AM      Passed - Valid encounter within last 12 months    Recent Outpatient Visits           4 days ago Chronic  bilateral low back pain without sciatica   [facility] Birdie Sons, [place]   1 year ago [place], [place]   2 years ago Essential (primary) hypertension   [place], [place]   2 years ago Need for influenza vaccination   [place], [place]   3 years ago Essential (primary) hypertension   [place], [place]       Future Appointments             In 1 month Fisher, Kirstie Peri, MD [facility], [place]

## 2020-01-21 NOTE — Telephone Encounter (Signed)
Requested Prescriptions  Pending Prescriptions Disp Refills  . DULoxetine (CYMBALTA) 30 MG capsule [Pharmacy Med Name: DULOXETINE HCL 30 MG CAP] 180 capsule 0    Sig: TAKE 2 CAPSULES BY MOUTH ONCE DAILY  - PER MD PLEASE SCHEDULE OFFICE VISIT FOR FOLLOW UP     Psychiatry: Antidepressants - SNRI Passed - 01/21/2020 11:27 AM      Passed - Last BP in normal range    BP Readings from Last 1 Encounters:  01/17/20 117/67         Passed - Valid encounter within last 6 months    Recent Outpatient Visits          4 days ago Chronic bilateral low back pain without sciatica   [facility] Birdie Sons, [place]   1 year ago [place], [place]   2 years ago Essential (primary) hypertension   [place], [place], [place]   2 years ago Need for influenza vaccination   [place], [place]   3 years ago Essential (primary) hypertension   [place], [place]      Future Appointments            In 1 month Fisher, Kirstie Peri, MD [facility], [place]           . gabapentin (NEURONTIN) 300 MG capsule [Pharmacy Med Name: GABAPENTIN 300 MG CAP] 90 capsule     Sig: TAKE 1 CAPSULE BY MOUTH 3 TIMES A DAY     Neurology: Anticonvulsants - gabapentin Passed - 01/21/2020 11:27 AM      Passed - Valid encounter within last 12 months    Recent Outpatient Visits          4 days ago Chronic bilateral low back pain without sciatica   [facility] Birdie Sons, MD   1 year ago [place], [place]   2 years ago Essential (primary) hypertension   [place], [place], [place]   2 years ago Need for influenza vaccination   [facility] [place], [place], [place]   3 years ago Essential (primary) hypertension   [facility] [place], [place], [place]      Future  Appointments            In 1 month Fisher, Kirstie Peri, MD [facility], [place]           . clonazePAM (KLONOPIN) 0.5 MG tablet [Pharmacy Med Name: CLONAZEPAM 0.5 MG TAB] 30 tablet     Sig: TAKE 1/2 TO 1 TABLET (0.25-0.5MG  TOTAL) BY MOUTH TWICE DAILY AS NEEDED FOR ANXIETY     Not Delegated - Psychiatry:  Anxiolytics/Hypnotics Failed - 01/21/2020 11:27 AM      Failed - This refill cannot be delegated      Failed - Urine Drug Screen completed in last 360 days.      Passed - Valid encounter within last 6 months    Recent Outpatient Visits          4 days ago Chronic bilateral low back pain without sciatica   [facility] Birdie Sons, [place]   1 year ago [place], [place]   2 years ago Essential (primary) hypertension   [place], [place]   2 years ago Need for influenza vaccination   [place],  [place], [place]   3 years ago Essential (primary) hypertension   [place], [place], [place]      Future Appointments            In 1 month Fisher, Kirstie Peri, MD [facility], [place]

## 2020-01-21 NOTE — Telephone Encounter (Signed)
Filled 01/17/20 for 2 month fill

## 2020-01-27 ENCOUNTER — Telehealth: Payer: Self-pay | Admitting: Family Medicine

## 2020-01-27 DIAGNOSIS — G8929 Other chronic pain: Secondary | ICD-10-CM

## 2020-01-27 MED ORDER — TRAMADOL HCL 50 MG PO TABS: 50.0000 mg | ORAL_TABLET | Freq: Three times a day (TID) | ORAL | 1 refills | Status: DC | PRN

## 2020-01-27 NOTE — Telephone Encounter (Signed)
Copied from [place] [id number]. Topic: General - Other >> Jan 27, 2020  3:14 PM Keene Breath wrote: Reason for CRM: Called to inform the doctor that the muscle relaxant and pain meds. He prescribed are not working.  He stated that he is in more pain than before.  Please advise and let patient know what he recommends.  CB# [phone]

## 2020-01-27 NOTE — Telephone Encounter (Signed)
He needs referral to orthopedist. If he likes I can send in prescription for a different pain medication to take until seen by ortho.

## 2020-01-27 NOTE — Telephone Encounter (Signed)
Patient advised and agrees to referral. Patient would like a different pain medication called in for him to take until he see's Orthopedics.   Pharmacy: [facility]

## 2020-02-18 ENCOUNTER — Encounter: Payer: Self-pay | Admitting: Family Medicine

## 2020-02-27 NOTE — Progress Notes (Signed)
Established patient visit   Patient: Stephen [name]   DOB: 05-29-1961   59 y.o. Male  MRN: [id number] Visit Date: 02/28/2020  Today's healthcare provider: Lelon Huh, MD   Chief Complaint  Patient presents with  . Back Pain   Subjective    HPI Follow up for Chronic bilateral low back pain  The patient was last seen for this 1 months ago. Changes made at last visit include started Prednisone taper, Robaxin 500 mg, advised to increase Gabapentin to 600 mg TID and ordered X-Ray. Patient requested Ortho referral on 5/28 due to medication not helping with pain. Prescription for Tramadol sent into pharmacy for patient to take until seen by Ortho. Patient was seen at [facility] by Dr. Sharyne Richters on 02/09/2020. Patient states Dr. Joselyn Arrow found additional problems and wanted to do an MRI.  Patient recently lost his insurance and is unable to do the MRI at this time.   He reports fair compliance with treatment. He feels that condition is Worse. He is not having side effects.   -----------------------------------------------------------------------------------------    Medications: Outpatient Medications Prior to Visit  Medication Sig  . aspirin 81 MG tablet Take 1 tablet by mouth daily.  . bisoprolol-hydrochlorothiazide (ZIAC) 10-6.25 MG tablet TAKE 2 TABLETS BY MOUTH ONCE A DAY  . gabapentin (NEURONTIN) 600 MG tablet Take 1 tablet (600 mg total) by mouth 3 (three) times daily.  . niacin 500 MG tablet Take 1 tablet by mouth daily.  . OMEGA-3 FATTY ACIDS PO Take 1 capsule by mouth daily.  Marland Kitchen amLODipine (NORVASC) 10 MG tablet Take 1 tablet (10 mg total) by mouth daily. (Patient not taking: Reported on 02/28/2020)  . clonazePAM (KLONOPIN) 0.5 MG tablet TAKE 1/2 TO 1 TABLET (0.25-0.5MG  TOTAL) BY MOUTH TWICE DAILY AS NEEDED FOR ANXIETY (Patient not taking: Reported on 02/28/2020)  . DULoxetine (CYMBALTA) 30 MG capsule TAKE 2 CAPSULES BY MOUTH ONCE DAILY  - PER MD PLEASE SCHEDULE OFFICE  VISIT FOR FOLLOW UP (Patient not taking: Reported on 02/28/2020)  . methocarbamol (ROBAXIN) 500 MG tablet Take one tablet up to three times a day and two at bedtime (Patient not taking: Reported on 02/28/2020)   No facility-administered medications prior to visit.    Review of Systems  Constitutional: Negative.  Negative for appetite change, chills and fever.  Respiratory: Negative.  Negative for chest tightness, shortness of breath and wheezing.   Cardiovascular: Negative.  Negative for chest pain and palpitations.  Gastrointestinal: Negative for abdominal pain, nausea and vomiting.  Musculoskeletal: Positive for back pain.     Objective    BP 110/70 (BP Location: Left Arm, Patient Position: Sitting, Cuff Size: Large)   Pulse 62   Temp (!) 97.1 F (36.2 C) (Temporal)   Resp 16   Wt 211 lb (95.7 kg)   SpO2 98% Comment: room air  BMI 27.09 kg/m   Physical Exam  General appearance:  Well developed, well nourished male, cooperative and in no acute distress Head: Normocephalic, without obvious abnormality, atraumatic Respiratory: Respirations even and unlabored, normal respiratory rate   No results found for any visits on 02/28/20.  Assessment & Plan     1. Chronic midline low back pain without sciatica Unable to proceed with MRI recommended by Dr. Sharyne Richters due to loss of insurance. Will start scheduled daily NSAID - meloxicam (MOBIC) 15 MG tablet; Take 1 tablet (15 mg total) by mouth daily.  Dispense: 30 tablet; Refill: 0 He has some improvement with tramadol and  will refill- traMADol (ULTRAM) 50 MG tablet; Take 1 tablet (50 mg total) by mouth every 8 (eight) hours as needed for up to 5 days.  Dispense: 30 tablet; Refill: 3  2. Essential (primary) hypertension refill- amLODipine (NORVASC) 10 MG tablet; Take 1 tablet (10 mg total) by mouth daily.  Dispense: 90 tablet; Refill: 3  3. Situational stress was previously doing well on - DULoxetine (CYMBALTA) 60 MG capsule; Take 1  capsule (60 mg total) by mouth daily.  Dispense: 90 capsule; Refill: 3 and - clonazePAM (KLONOPIN) 0.5 MG tablet; TAKE 1/2 TO 1 TABLET (0.25-0.5MG  TOTAL) BY MOUTH TWICE DAILY AS NEEDED FOR ANXIETY  Dispense: 30 tablet; Refill: 3        The entirety of the information documented in the History of Present Illness, Review of Systems and Physical Exam were personally obtained by me. Portions of this information were initially documented by the CMA and reviewed by me for thoroughness and accuracy.      Lelon Huh, MD  [facility] [phone] (phone) [phone] (fax)  [place]

## 2020-02-28 ENCOUNTER — Other Ambulatory Visit: Payer: Self-pay

## 2020-02-28 ENCOUNTER — Ambulatory Visit (INDEPENDENT_AMBULATORY_CARE_PROVIDER_SITE_OTHER): Payer: Self-pay | Admitting: Family Medicine

## 2020-02-28 ENCOUNTER — Encounter: Payer: Self-pay | Admitting: Family Medicine

## 2020-02-28 DIAGNOSIS — M545 Low back pain: Secondary | ICD-10-CM

## 2020-02-28 DIAGNOSIS — I10 Essential (primary) hypertension: Secondary | ICD-10-CM

## 2020-02-28 DIAGNOSIS — F439 Reaction to severe stress, unspecified: Secondary | ICD-10-CM

## 2020-02-28 DIAGNOSIS — G8929 Other chronic pain: Secondary | ICD-10-CM

## 2020-02-28 MED ORDER — DULOXETINE HCL 60 MG PO CPEP: 60.0000 mg | ORAL_CAPSULE | Freq: Every day | ORAL | 3 refills | Status: DC

## 2020-02-28 MED ORDER — CLONAZEPAM 0.5 MG PO TABS: ORAL_TABLET | ORAL | 3 refills | Status: DC

## 2020-02-28 MED ORDER — MELOXICAM 15 MG PO TABS: 15.0000 mg | ORAL_TABLET | Freq: Every day | ORAL | 0 refills | Status: DC

## 2020-02-28 MED ORDER — AMLODIPINE BESYLATE 10 MG PO TABS: 10.0000 mg | ORAL_TABLET | Freq: Every day | ORAL | 3 refills | Status: DC

## 2020-02-28 MED ORDER — TRAMADOL HCL 50 MG PO TABS: 50.0000 mg | ORAL_TABLET | Freq: Three times a day (TID) | ORAL | 3 refills | Status: AC | PRN

## 2020-03-21 ENCOUNTER — Other Ambulatory Visit: Payer: Self-pay | Admitting: Family Medicine

## 2020-03-21 DIAGNOSIS — G8929 Other chronic pain: Secondary | ICD-10-CM

## 2020-03-21 DIAGNOSIS — I10 Essential (primary) hypertension: Secondary | ICD-10-CM

## 2020-04-18 ENCOUNTER — Other Ambulatory Visit: Payer: Self-pay | Admitting: Family Medicine

## 2020-08-12 IMAGING — CR DG LUMBAR SPINE COMPLETE 4+V
1 series · 5 of 5 positions shown · non-contrast
Comparison: 08/05/2013

CLINICAL DATA: Low back pain, no known injury, initial encounter

EXAM:
LUMBAR SPINE - COMPLETE 4+ VIEW

[Series 1: dg lumbar spine complete 4 +v · 0.14mm/px · 5 of 5 slices shown]
[im 1/5]
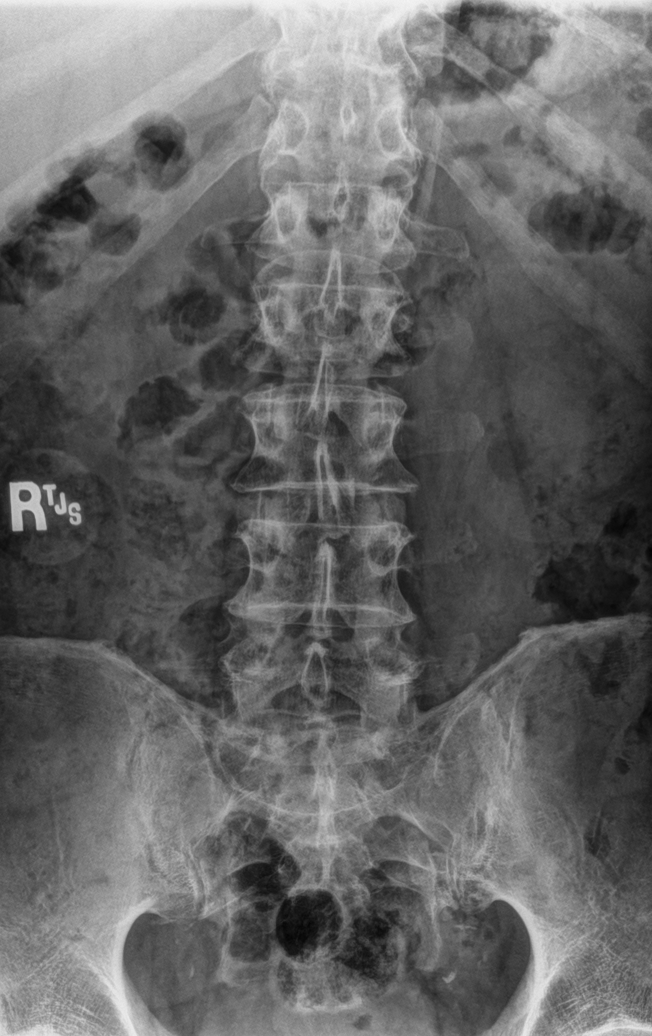
[im 2/5]
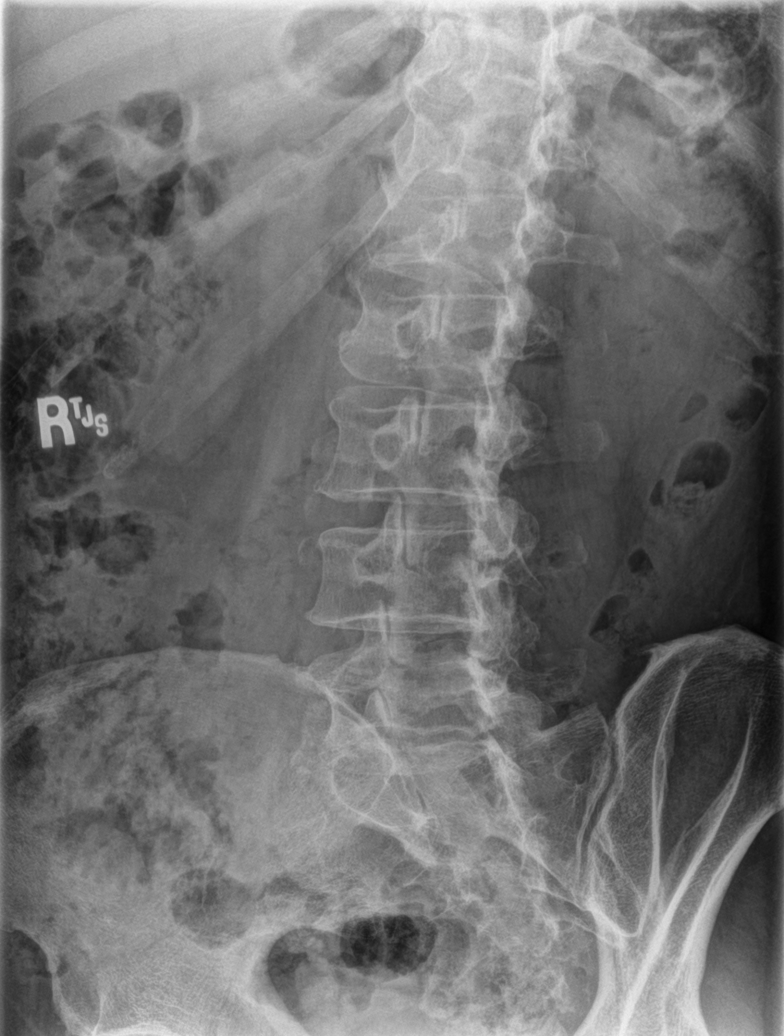
[im 3/5]
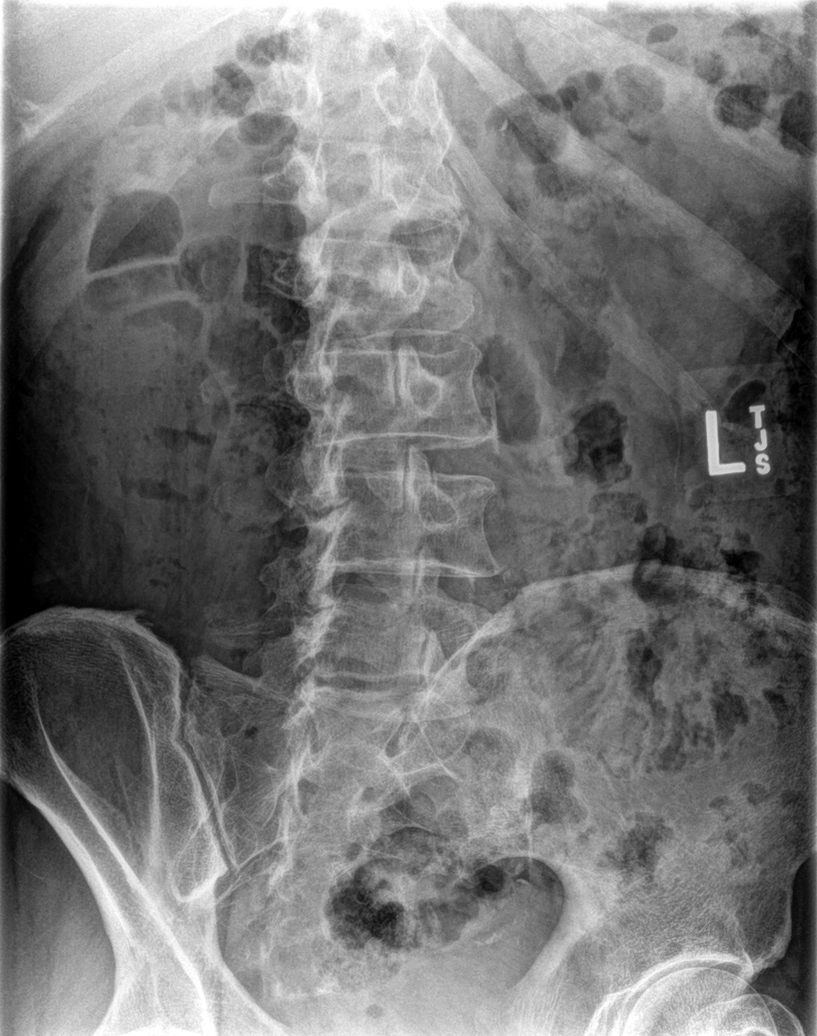
[im 4/5]
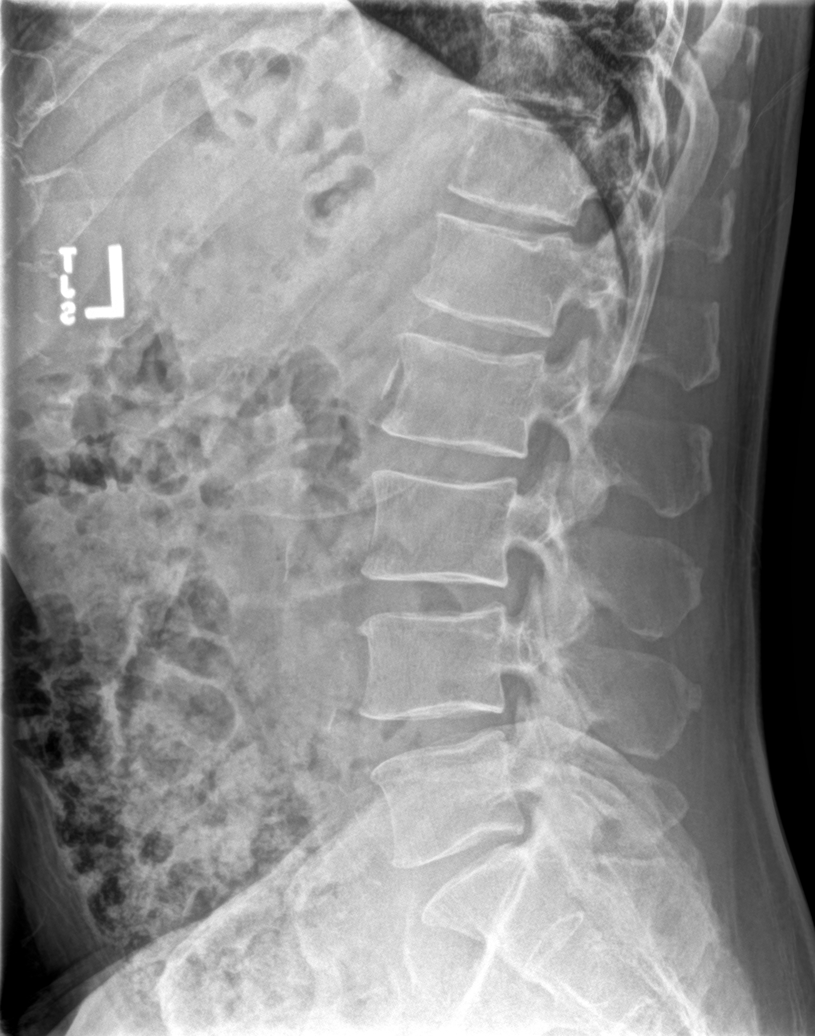
[im 5/5]
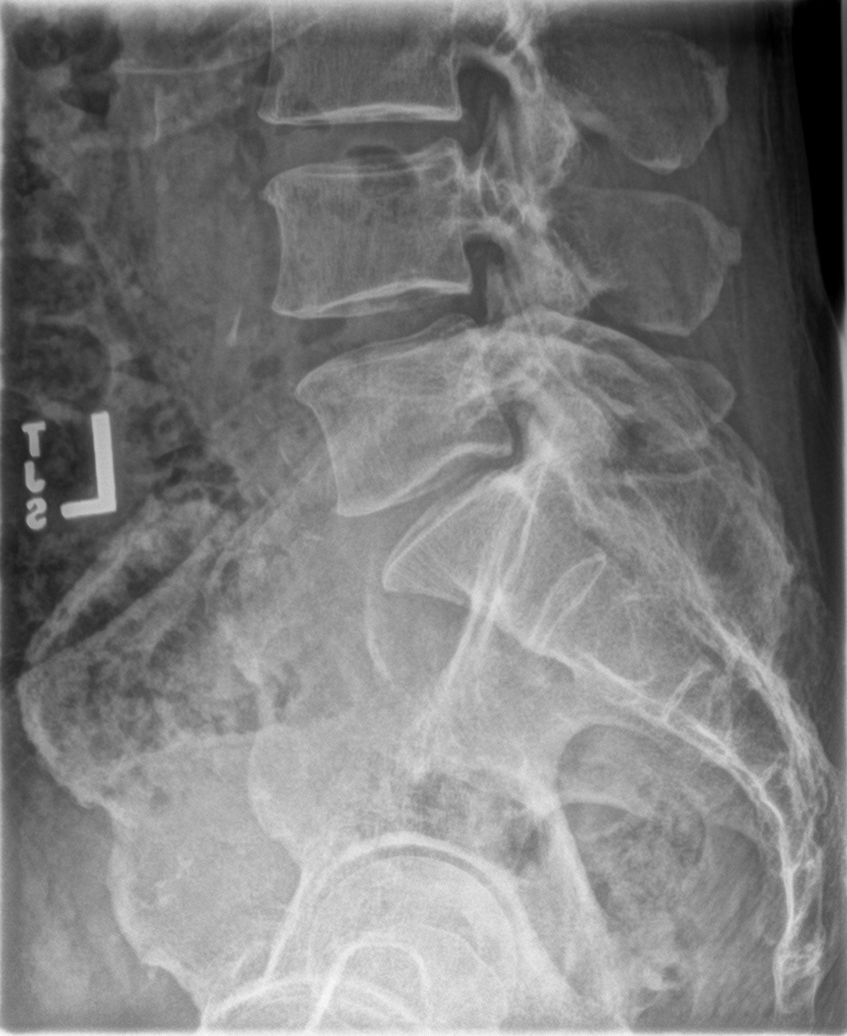

[5 of 5 positions shown; findings below may reference images not displayed]

FINDINGS: Five lumbar type vertebral bodies are well visualized. Mild
osteophytic changes are noted. No pars defects are seen. No
anterolisthesis is noted. No soft tissue abnormality is seen.
IMPRESSION: Mild degenerative change without acute abnormality.

## 2020-08-15 ENCOUNTER — Other Ambulatory Visit: Payer: Self-pay | Admitting: Family Medicine

## 2020-08-15 NOTE — Telephone Encounter (Signed)
Last ordered 04/18/20 with note from physician to schedule a follow-up Left VM to return call for appointment. 30 day courtesy supply approved.

## 2020-09-11 ENCOUNTER — Other Ambulatory Visit: Payer: Self-pay | Admitting: Family Medicine

## 2020-09-11 DIAGNOSIS — F439 Reaction to severe stress, unspecified: Secondary | ICD-10-CM

## 2020-09-11 NOTE — Telephone Encounter (Signed)
Requested medication (s) are due for refill today: no  Requested medication (s) are on the active medication list:  yes  Last refill:  07/12/2020  Future visit scheduled: no  Notes to clinic:  this refill cannot be delegated   Requested Prescriptions  Pending Prescriptions Disp Refills   clonazePAM (KLONOPIN) 0.5 MG tablet [Pharmacy Med Name: CLONAZEPAM 0.5 MG TAB] 30 tablet     Sig: TAKE 1/2 TO 1 TABLET (0.25 TO 0.5 MG) BYMOUTH TWICE DAILY AS NEEDED FOR ANXIETY      Not Delegated - Psychiatry:  Anxiolytics/Hypnotics Failed - 09/11/2020 12:18 PM      Failed - This refill cannot be delegated      Failed - Urine Drug Screen completed in last 360 days      Failed - Valid encounter within last 6 months    Recent Outpatient Visits           6 months ago Chronic midline low back pain without sciatica   [place], [place]   7 months ago Chronic bilateral low back pain without sciatica   [facility] Birdie Sons, MD   2 years ago [place], [place], [place]   3 years ago Essential (primary) hypertension   [place], [place], [place]   3 years ago Need for influenza vaccination   [facility] [place], [place]

## 2020-11-08 ENCOUNTER — Other Ambulatory Visit: Payer: Self-pay | Admitting: Family Medicine

## 2020-11-08 DIAGNOSIS — M545 Low back pain, unspecified: Secondary | ICD-10-CM

## 2020-11-08 DIAGNOSIS — G8929 Other chronic pain: Secondary | ICD-10-CM

## 2020-11-19 ENCOUNTER — Other Ambulatory Visit: Payer: Self-pay | Admitting: Family Medicine

## 2020-11-19 NOTE — Telephone Encounter (Signed)
Requested medication (s) are due for refill today: Yes  Requested medication (s) are on the active medication list: Yes  Last refill:  09/11/20  Future visit scheduled: No  Notes to clinic:  Unable to refill per protocol, appointment needed, courtesy refill already given     Requested Prescriptions  Pending Prescriptions Disp Refills   DULoxetine (CYMBALTA) 30 MG capsule [Pharmacy Med Name: DULOXETINE HCL 30 MG CAP] 60 capsule 0    Sig: TAKE 2 CAPSULES BY MOUTH ONCE DAILY  - PER MD PLEASE SCHEDULE OFFICE VISIT FOR FOLLOW UP      Psychiatry: Antidepressants - SNRI Failed - 11/19/2020  5:15 PM      Failed - Valid encounter within last 6 months    Recent Outpatient Visits           8 months ago Chronic midline low back pain without sciatica   [facility] Birdie Sons, MD   10 months ago Chronic bilateral low back pain without sciatica   [facility] Birdie Sons, MD   2 years ago [place], [place], [place]   3 years ago Essential (primary) hypertension   [place], [place], [place]   3 years ago Need for influenza vaccination   [place], [place]                Passed - Last BP in normal range    BP Readings from Last 1 Encounters:  02/28/20 110/70

## 2020-11-19 NOTE — Telephone Encounter (Signed)
Patient called, left VM to return the call to the office to schedule an OV for follow up. Courtesy refill already given, routing to the office for provider approval.

## 2020-11-20 NOTE — Telephone Encounter (Signed)
Attempted to contact patient again no answer. VM left

## 2020-11-20 NOTE — Telephone Encounter (Signed)
Please clarify dose. Is he taking 2 30mg  capsules every day, if so then we can change to 1 60mg  capsule.  Also, he is due for follow up and needs to schedule in the next 1-2 months.

## 2020-12-07 ENCOUNTER — Other Ambulatory Visit: Payer: Self-pay | Admitting: Family Medicine

## 2020-12-07 DIAGNOSIS — I10 Essential (primary) hypertension: Secondary | ICD-10-CM

## 2020-12-07 NOTE — Telephone Encounter (Signed)
Requested medication (s) are due for refill today: yes  Requested medication (s) are on the active medication list:  yes  Last refill:  09/11/2020  Future visit scheduled: no  Notes to clinic:  overdue for follow up appointment Several attempts have been made to contact patient    Requested Prescriptions  Pending Prescriptions Disp Refills   bisoprolol-hydrochlorothiazide (ZIAC) 10-6.25 MG tablet [Pharmacy Med Name: BISOPROLOL-HCTZ 10-6.25 MG TAB] 180 tablet 1    Sig: TAKE 2 TABLETS BY MOUTH ONCE A DAY      Cardiovascular: Beta Blocker + Diuretic Combos Failed - 12/07/2020 11:25 AM      Failed - K in normal range and within 180 days    Potassium  Date Value Ref Range Status  01/18/2020 4.0 3.5 - 5.2 mmol/L Final          Failed - Na in normal range and within 180 days    Sodium  Date Value Ref Range Status  01/18/2020 139 134 - 144 mmol/L Final          Failed - Cr in normal range and within 180 days    Creat  Date Value Ref Range Status  06/22/2017 0.79 0.70 - 1.33 mg/dL Final    Comment:    For patients >24 years of age, the reference limit for Creatinine is approximately 13% higher for people identified as African-American. .    Creatinine, Ser  Date Value Ref Range Status  01/18/2020 0.74 (L) 0.76 - 1.27 mg/dL Final          Failed - Ca in normal range and within 180 days    Calcium  Date Value Ref Range Status  01/18/2020 9.2 8.7 - 10.2 mg/dL Final          Failed - Valid encounter within last 6 months    Recent Outpatient Visits           9 months ago Chronic midline low back pain without sciatica   [facility] Birdie Sons, MD   10 months ago Chronic bilateral low back pain without sciatica   [facility] Birdie Sons, MD   2 years ago [place], [place], [place]   3 years ago Essential (primary) hypertension   [place], [place], [place]   3 years  ago Need for influenza vaccination   [facility] [place], [place] - Patient is not pregnant      Passed - Last BP in normal range    BP Readings from Last 1 Encounters:  02/28/20 110/70          Passed - Last Heart Rate in normal range    Pulse Readings from Last 1 Encounters:  02/28/20 62

## 2020-12-18 NOTE — Telephone Encounter (Signed)
NA, left detailed voicemail for patient to call back.

## 2020-12-20 NOTE — Telephone Encounter (Signed)
Multiple attempts made to contact patient.  Rx was never sent in and after 1 month, patient has not returned any calls.  Letter sent to patient.  Medication was refused on this day in order to close out message

## 2021-01-08 ENCOUNTER — Other Ambulatory Visit: Payer: Self-pay | Admitting: Family Medicine

## 2021-01-08 NOTE — Telephone Encounter (Signed)
Requested medication (s) are due for refill today: no  Requested medication (s) are on the active medication list: yes  Last refill: 09/11/2020  Future visit scheduled: no  Notes to clinic:  patient has not contact to schedule appt  Overdue for follow up   Requested Prescriptions  Pending Prescriptions Disp Refills   DULoxetine (CYMBALTA) 30 MG capsule [Pharmacy Med Name: DULOXETINE HCL 30 MG CAP] 60 capsule 0    Sig: TAKE 2 CAPSULES BY MOUTH ONCE DAILY  - PER MD PLEASE SCHEDULE OFFICE VISIT FOR FOLLOW UP      Psychiatry: Antidepressants - SNRI Failed - 01/08/2021 12:49 PM      Failed - Valid encounter within last 6 months    Recent Outpatient Visits           10 months ago Chronic midline low back pain without sciatica   [facility] Birdie Sons, MD   11 months ago Chronic bilateral low back pain without sciatica   [facility] Birdie Sons, MD   2 years ago [place], [place], [place]   3 years ago Essential (primary) hypertension   [place], [place], [place]   3 years ago Need for influenza vaccination   [place], [place]                Passed - Last BP in normal range    BP Readings from Last 1 Encounters:  02/28/20 110/70

## 2021-02-07 ENCOUNTER — Other Ambulatory Visit: Payer: Self-pay | Admitting: Family Medicine

## 2021-02-07 NOTE — Telephone Encounter (Signed)
  Notes to clinic: courtesy refill given but no appt scheduled    Requested Prescriptions  Pending Prescriptions Disp Refills   DULoxetine (CYMBALTA) 30 MG capsule [Pharmacy Med Name: DULOXETINE HCL 30 MG CAP] 60 capsule 0    Sig: TAKE 2 CAPSULES BY MOUTH DAILY. PATIENT NEEDS TO SCHEDULE OFFICE VISIT FOR FOLLOW UP      Psychiatry: Antidepressants - SNRI Failed - 02/07/2021  9:42 AM      Failed - Valid encounter within last 6 months    Recent Outpatient Visits           11 months ago Chronic midline low back pain without sciatica   [place], [place]   1 year ago Chronic bilateral low back pain without sciatica   [facility] Birdie Sons, MD   2 years ago [place], [place], [place]   3 years ago Essential (primary) hypertension   [place], [place], [place]   3 years ago Need for influenza vaccination   [place], [place]                Passed - Last BP in normal range    BP Readings from Last 1 Encounters:  02/28/20 110/70

## 2021-03-08 ENCOUNTER — Other Ambulatory Visit: Payer: Self-pay | Admitting: Family Medicine

## 2021-03-08 DIAGNOSIS — I10 Essential (primary) hypertension: Secondary | ICD-10-CM

## 2021-03-08 DIAGNOSIS — F439 Reaction to severe stress, unspecified: Secondary | ICD-10-CM

## 2021-03-08 NOTE — Telephone Encounter (Signed)
Attempted to call patient to schedule appointment- left message to call office. Courtesy RF given #30

## 2021-03-08 NOTE — Telephone Encounter (Signed)
Requested medication (s) are due for refill today - yes  Requested medication (s) are on the active medication list -yes  Future visit scheduled -no  Last refill: 09/11/20 #30 5RF  Notes to clinic: Attempted to call patient to schedule appointment- left message to call office. Request RF- non delegated Rx  Requested Prescriptions  Pending Prescriptions Disp Refills   clonazePAM (KLONOPIN) 0.5 MG tablet [Pharmacy Med Name: CLONAZEPAM 0.5 MG TAB] 30 tablet     Sig: TAKE 1/2 TO 1 TABLET (0.25 TO 0.5 MG) BYMOUTH TWICE DAILY AS NEEDED FOR ANXIETY      Not Delegated - Psychiatry:  Anxiolytics/Hypnotics Failed - 03/08/2021  4:14 PM      Failed - This refill cannot be delegated      Failed - Urine Drug Screen completed in last 360 days      Failed - Valid encounter within last 6 months    Recent Outpatient Visits           1 year ago Chronic midline low back pain without sciatica   [facility] Birdie Sons, MD   1 year ago Chronic bilateral low back pain without sciatica   [facility] Birdie Sons, MD   2 years ago [place], [place], [place]   3 years ago Essential (primary) hypertension   [place], [place], [place]   3 years ago Need for influenza vaccination   [facility] [place], [place], [place]                 Signed Prescriptions Disp Refills   amLODipine (NORVASC) 10 MG tablet 30 tablet 0    Sig: TAKE 1 TABLET BY MOUTH ONCE A DAY      Cardiovascular:  Calcium Channel Blockers Failed - 03/08/2021  4:14 PM      Failed - Valid encounter within last 6 months    Recent Outpatient Visits           1 year ago Chronic midline low back pain without sciatica   [facility] Birdie Sons, MD   1 year ago Chronic bilateral low back pain without sciatica   [facility] Birdie Sons, MD   2 years ago [place], [place], [place]   3 years ago Essential (primary) hypertension   [place], [place], [place]   3 years ago Need for influenza vaccination   [place], [place]                Passed - Last BP in normal range    BP Readings from Last 1 Encounters:  02/28/20 110/70              Requested Prescriptions  Pending Prescriptions Disp Refills   clonazePAM (KLONOPIN) 0.5 MG tablet [Pharmacy Med Name: CLONAZEPAM 0.5 MG TAB] 30 tablet     Sig: TAKE 1/2 TO 1 TABLET (0.25 TO 0.5 MG) BYMOUTH TWICE DAILY AS NEEDED FOR ANXIETY      Not Delegated - Psychiatry:  Anxiolytics/Hypnotics Failed - 03/08/2021  4:14 PM      Failed - This refill cannot be delegated      Failed - Urine Drug Screen completed in last 360 days      Failed - Valid encounter within last 6 months    Recent Outpatient Visits           1  year ago Chronic midline low back pain without sciatica   [facility] Birdie Sons, MD   1 year ago Chronic bilateral low back pain without sciatica   [facility] Birdie Sons, MD   2 years ago [place], [place], [place]   3 years ago Essential (primary) hypertension   [place], [place], [place]   3 years ago Need for influenza vaccination   [place], [place]                 Signed Prescriptions Disp Refills   amLODipine (NORVASC) 10 MG tablet 30 tablet 0    Sig: TAKE 1 TABLET BY MOUTH ONCE A DAY      Cardiovascular:  Calcium Channel Blockers Failed - 03/08/2021  4:14 PM      Failed - Valid encounter within last 6 months    Recent Outpatient Visits           1 year ago Chronic midline low back pain without sciatica   [facility] Birdie Sons, MD   1 year ago Chronic bilateral low back pain without sciatica   [facility] Birdie Sons, MD   2  years ago [place], [place], [place]   3 years ago Essential (primary) hypertension   [place], [place], [place]   3 years ago Need for influenza vaccination   [place], [place]                Passed - Last BP in normal range    BP Readings from Last 1 Encounters:  02/28/20 110/70

## 2021-05-08 ENCOUNTER — Other Ambulatory Visit: Payer: Self-pay | Admitting: Family Medicine

## 2021-05-08 DIAGNOSIS — F439 Reaction to severe stress, unspecified: Secondary | ICD-10-CM

## 2021-09-05 ENCOUNTER — Other Ambulatory Visit: Payer: Self-pay | Admitting: Family Medicine

## 2021-09-05 DIAGNOSIS — M545 Low back pain, unspecified: Secondary | ICD-10-CM

## 2021-09-05 DIAGNOSIS — I10 Essential (primary) hypertension: Secondary | ICD-10-CM

## 2021-09-05 NOTE — Telephone Encounter (Signed)
Left message advising pt.  PEC please schedule pt when he calls back.   Thanks,   -Mickel Baas

## 2021-09-05 NOTE — Telephone Encounter (Signed)
Not seen in a year and  a half, needs to schedule appt before rf can be approved.

## 2021-10-03 ENCOUNTER — Other Ambulatory Visit: Payer: Self-pay | Admitting: Family Medicine

## 2021-10-03 DIAGNOSIS — I10 Essential (primary) hypertension: Secondary | ICD-10-CM

## 2021-10-03 NOTE — Telephone Encounter (Signed)
Requested medications are due for refill today.  yes  Requested medications are on the active medications list.  yes  Last refill. 03/08/2021 #30  Future visit scheduled.   no  Notes to clinic.  Pt last seen 02/28/2020. Pt is more than 3 months overdue for an OV.    Requested Prescriptions  Pending Prescriptions Disp Refills   amLODipine (NORVASC) 10 MG tablet [Pharmacy Med Name: AMLODIPINE BESYLATE 10 MG TAB] 30 tablet 0    Sig: TAKE 1 TABLET BY MOUTH ONCE A DAY     Cardiovascular: Calcium Channel Blockers 2 Failed - 10/03/2021  2:53 PM      Failed - Valid encounter within last 6 months    Recent Outpatient Visits           1 year ago Chronic midline low back pain without sciatica   [facility] Birdie Sons, MD   1 year ago Chronic bilateral low back pain without sciatica   [facility] Birdie Sons, MD   3 years ago [place], [place], [place]   4 years ago Essential (primary) hypertension   [place], [place], [place]   4 years ago Need for influenza vaccination   [place], [place]              Passed - Last BP in normal range    BP Readings from Last 1 Encounters:  02/28/20 110/70          Passed - Last Heart Rate in normal range    Pulse Readings from Last 1 Encounters:  02/28/20 62

## 2021-12-04 ENCOUNTER — Other Ambulatory Visit: Payer: Self-pay | Admitting: Family Medicine

## 2021-12-04 DIAGNOSIS — I10 Essential (primary) hypertension: Secondary | ICD-10-CM

## 2021-12-04 DIAGNOSIS — M545 Low back pain, unspecified: Secondary | ICD-10-CM

## 2021-12-04 DIAGNOSIS — F439 Reaction to severe stress, unspecified: Secondary | ICD-10-CM

## 2022-01-02 ENCOUNTER — Other Ambulatory Visit: Payer: Self-pay | Admitting: Family Medicine

## 2022-01-02 DIAGNOSIS — I10 Essential (primary) hypertension: Secondary | ICD-10-CM

## 2022-01-02 NOTE — Telephone Encounter (Signed)
Requested medication (s) are due for refill today -expired Rx, due RF ? ?Requested medication (s) are on the active medication list -yes ? ?Future visit scheduled -yes ? ?Last refill: [place] 12/07/20 #180 1RF ?                Cymbalta 05/08/21 #60 ?                 Amlodipine 03/08/21 #30  ? ?Notes to clinic: call to patient- patient scheduled for annual physical- first available, fails lab protocol-2021, fails visit protocol- sent for provider review.  Patient has also changed [place] ? ?Requested Prescriptions  ?Pending Prescriptions Disp Refills  ? bisoprolol-hydrochlorothiazide (ZIAC) 10-6.25 MG tablet [Pharmacy Med Name: BISOPROLOL-HCTZ 10-6.25 MG TAB] 180 tablet 1  ?  Sig: TAKE 2 TABLETS BY MOUTH ONCE A DAY  ?  ? Cardiovascular: Beta Blocker + Diuretic Combos Failed - 01/02/2022  1:51 PM  ?  ?  Failed - K in normal range and within 180 days  ?  Potassium  ?Date Value Ref Range Status  ?01/18/2020 4.0 3.5 - 5.2 mmol/L Final  ?  ?  ?  ?  Failed - Na in normal range and within 180 days  ?  Sodium  ?Date Value Ref Range Status  ?01/18/2020 139 134 - 144 mmol/L Final  ?  ?  ?  ?  Failed - Cr in normal range and within 180 days  ?  Creat  ?Date Value Ref Range Status  ?06/22/2017 0.79 0.70 - 1.33 mg/dL Final  ?  Comment:  ?  For patients >58 years of age, the reference limit ?for Creatinine is approximately 13% higher for people ?identified as African-American. ?. ?  ? ?Creatinine, Ser  ?Date Value Ref Range Status  ?01/18/2020 0.74 (L) 0.76 - 1.27 mg/dL Final  ?  ?  ?  ?  Failed - eGFR in normal range and within 180 days  ?  GFR, Est African American  ?Date Value Ref Range Status  ?06/22/2017 117 > OR = 60 mL/min/1.79m Final  ? ?GFR calc Af Amer  ?Date Value Ref Range Status  ?01/18/2020 118 >59 mL/min/1.73 Final  ?  Comment:  ?  **[facility] currently reports eGFR in compliance with the current** ?  recommendations of the [employer] [facility] will ?  update reporting as new  guidelines are published from the NKF-ASN ?  Task force. ?  ? ?GFR, Est Non African American  ?Date Value Ref Range Status  ?06/22/2017 101 > OR = 60 mL/min/1.771mFinal  ? ?GFR calc non Af Amer  ?Date Value Ref Range Status  ?01/18/2020 102 >59 mL/min/1.73 Final  ?  ?  ?  ?  Failed - Valid encounter within last 6 months  ?  Recent Outpatient Visits   ? ?      ? 1 year ago Chronic midline low back pain without sciatica  ? [facility]  ? 1 year ago Chronic bilateral low back pain without sciatica  ? [facility]  ? 3 years ago Situational stress  ? [place] 4 years ago Essential (primary) hypertension  ? [place] 4 years ago Need for influenza vaccination  ? [place]? ?  ?  ?Future Appointments   ? ?        ?  In 3 months [place], Kirstie Peri, MD [facility], [facility]  ? ?  ? ? ?  ?  ?  Passed - Last BP in normal range  ?  BP Readings from Last 1 Encounters:  ?02/28/20 110/70  ?  ?  ?  ?  Passed - Last Heart Rate in normal range  ?  Pulse Readings from Last 1 Encounters:  ?02/28/20 62  ?  ?  ?  ?  ? DULoxetine (CYMBALTA) 30 MG capsule [Pharmacy Med Name: DULOXETINE HCL 30 MG CAP] 60 capsule 0  ?  Sig: TAKE 2 CAPSULES BY MOUTH DAILY. PATIENT NEEDS TO SCHEDULE OFFICE VISIT FOR FOLLOW UP  ?  ? Psychiatry: Antidepressants - SNRI - duloxetine Failed - 01/02/2022  1:51 PM  ?  ?  Failed - Cr in normal range and within 360 days  ?  Creat  ?Date Value Ref Range Status  ?06/22/2017 0.79 0.70 - 1.33 mg/dL Final  ?  Comment:  ?  For patients >55 years of age, the reference limit ?for Creatinine is approximately 13% higher for people ?identified as African-American. ?. ?  ? ?Creatinine, Ser  ?Date Value Ref Range Status  ?01/18/2020 0.74 (L) 0.76 - 1.27 mg/dL Final  ?  ?  ?  ?  Failed - eGFR is 30 or above and within 360 days  ?  GFR,  Est African American  ?Date Value Ref Range Status  ?06/22/2017 117 > OR = 60 mL/min/1.34m Final  ? ?GFR calc Af Amer  ?Date Value Ref Range Status  ?01/18/2020 118 >59 mL/min/1.73 Final  ?  Comment:  ?  **[facility] currently reports eGFR in compliance with the current** ?  recommendations of the [employer] [facility] will ?  update reporting as new guidelines are published from the NKF-ASN ?  Task force. ?  ? ?GFR, Est Non African American  ?Date Value Ref Range Status  ?06/22/2017 101 > OR = 60 mL/min/1.748mFinal  ? ?GFR calc non Af Amer  ?Date Value Ref Range Status  ?01/18/2020 102 >59 mL/min/1.73 Final  ?  ?  ?  ?  Failed - Completed PHQ-2 or PHQ-9 in the last 360 days  ?  ?  Failed - Valid encounter within last 6 months  ?  Recent Outpatient Visits   ? ?      ? 1 year ago Chronic midline low back pain without sciatica  ? [facility]  ? 1 year ago Chronic bilateral low back pain without sciatica  ? [facility]  ? 3 years ago Situational stress  ? [place] 4 years ago Essential (primary) hypertension  ? [place] 4 years ago Need for influenza vaccination  ? [place]? ?  ?  ?Future Appointments   ? ?        ? In 3 months Fisher, DoKirstie PeriMD [facility]  ? ?  ? ? ?  ?  ?  Passed - Last BP in normal range  ?  BP Readings from Last 1 Encounters:  ?02/28/20 110/70  ?  ?  ?  ?  ? amLODipine (NORVASC) 10 MG tablet [Pharmacy Med Name: AMLODIPINE BESYLATE 10 MG TAB] 30 tablet 0  ?  Sig: TAKE 1 TABLET BY MOUTH ONCE A DAY  ?  ? Cardiovascular:  Calcium Channel Blockers 2 Failed - 01/02/2022  1:51 PM  ?  ?  Failed - Valid encounter within last 6 months  ?  Recent Outpatient Visits   ? ?      ? 1 year ago Chronic midline low back pain without sciatica  ? [facility] Birdie Sons, MD   ? 1 year ago Chronic bilateral low back pain without sciatica  ? [facility] Caryn Section, Kirstie Peri, MD  ? 3 years ago Situational stress  ? [place], [place]  ? 4 years ago Essential (primary) hypertension  ? [place], [place]  ? 4 years ago Need for influenza vaccination  ? [place], [place]  ? ?  ?  ?Future Appointments   ? ?        ? In 3 months Fisher, Kirstie Peri, MD [facility], [facility]  ? ?  ? ? ?  ?  ?  Passed - Last BP in normal range  ?  BP Readings from Last 1 Encounters:  ?02/28/20 110/70  ?  ?  ?  ?  Passed - Last Heart Rate in normal range  ?  Pulse Readings from Last 1 Encounters:  ?02/28/20 62  ?  ?  ?  ?  ? ? ? ?Requested Prescriptions  ?Pending Prescriptions Disp Refills  ? bisoprolol-hydrochlorothiazide (ZIAC) 10-6.25 MG tablet [Pharmacy Med Name: BISOPROLOL-HCTZ 10-6.25 MG TAB] 180 tablet 1  ?  Sig: TAKE 2 TABLETS BY MOUTH ONCE A DAY  ?  ? Cardiovascular: Beta Blocker + Diuretic Combos Failed - 01/02/2022  1:51 PM  ?  ?  Failed - K in normal range and within 180 days  ?  Potassium  ?Date Value Ref Range Status  ?01/18/2020 4.0 3.5 - 5.2 mmol/L Final  ?  ?  ?  ?  Failed - Na in normal range and within 180 days  ?  Sodium  ?Date Value Ref Range Status  ?01/18/2020 139 134 - 144 mmol/L Final  ?  ?  ?  ?  Failed - Cr in normal range and within 180 days  ?  Creat  ?Date Value Ref Range Status  ?06/22/2017 0.79 0.70 - 1.33 mg/dL Final  ?  Comment:  ?  For patients >80 years of age, the reference limit ?for Creatinine is approximately 13% higher for people ?identified as African-American. ?. ?  ? ?Creatinine, Ser  ?Date Value Ref Range Status  ?01/18/2020 0.74 (L) 0.76 - 1.27 mg/dL Final  ?  ?  ?  ?  Failed - eGFR in normal range and within 180 days  ?  GFR, Est African American  ?Date Value Ref Range Status  ?06/22/2017 117 > OR = 60 mL/min/1.9m Final  ? ?GFR calc Af Amer  ?Date Value Ref Range  Status  ?01/18/2020 118 >59 mL/min/1.73 Final  ?  Comment:  ?  **[facility] currently reports eGFR in compliance with the current** ?  recommendations of the [employer] [facility] will ?  update report

## 2022-01-03 NOTE — Telephone Encounter (Signed)
[place] Pt has not been seen in office since 02-28-20 and advised he must come in for a f/u appt before meds are refilled.  Explained he can keep his appt for CPE on 04-09-22 but he at least needs a follow up to refill chronic meds or can change appt to CPE if available.    ?

## 2022-03-05 ENCOUNTER — Other Ambulatory Visit: Payer: Self-pay | Admitting: Family Medicine

## 2022-03-05 DIAGNOSIS — M545 Low back pain, unspecified: Secondary | ICD-10-CM

## 2022-03-05 NOTE — Telephone Encounter (Signed)
Patient hasn't been seen in the office since 02/28/2020. It looks like he is scheduled for a CPE on 04/09/2022. Please advise on refill request.

## 2022-04-01 ENCOUNTER — Other Ambulatory Visit: Payer: Self-pay | Admitting: Family Medicine

## 2022-04-01 DIAGNOSIS — F439 Reaction to severe stress, unspecified: Secondary | ICD-10-CM

## 2022-04-08 NOTE — Progress Notes (Deleted)
Complete physical exam   Patient: Stephen [name]   DOB: 07-30-1961   61 y.o. Male  MRN: [id number] Visit Date: 04/09/2022  Today's healthcare provider: Lelon Huh, MD   No chief complaint on file.  Subjective    Stephen [name] is a 61 y.o. male who presents today for a complete physical exam.  He reports consuming a {diet types:17450} diet. {Exercise:19826} He generally feels {well/fairly well/poorly:18703}. He reports sleeping {well/fairly well/poorly:18703}. He {does/does not:200015} have additional problems to discuss today.  HPI  ***  Past Medical History:  Diagnosis Date   Hyperlipidemia    Hypertension    Past Surgical History:  Procedure Laterality Date   COLONOSCOPY  10/31/2013   TONSILLECTOMY AND ADENOIDECTOMY     Social History   Socioeconomic History   Marital status: Married    Spouse name: Not on file   Number of children: Not on file   Years of education: 12   Highest education level: Not on file  Occupational History   Not on file  Tobacco Use   Smoking status: Never   Smokeless tobacco: Never  Substance and Sexual Activity   Alcohol use: Yes    Alcohol/week: 120.0 standard drinks of alcohol    Types: 84 Cans of beer, 36 Shots of liquor per week    Comment: 12 cans of beer and 3 shots of liquor daily    Drug use: No   Sexual activity: Not on file  Other Topics Concern   Not on file  Social History Narrative   Not on file   Social Determinants of Health   Financial Resource Strain: Not on file  Food Insecurity: Not on file  Transportation Needs: Not on file  Physical Activity: Not on file  Stress: Not on file  Social Connections: Not on file  Intimate Partner Violence: Not on file   Family Status  Relation Name Status   Mother  Deceased       30-Dec-2013   Father  Deceased at age 42S   Brother  Alive   Daughter  Alive   Son  Alive   Family History  Problem Relation Age of Onset   Congestive Heart Failure Mother     Hypertension Mother    COPD Father    Heart failure Father    HIV Brother    No Known Allergies  Patient Care Team: Birdie Sons, MD as PCP - General (Family Medicine)   Medications: Outpatient Medications Prior to Visit  Medication Sig   amLODipine (NORVASC) 10 MG tablet Take 1 tablet (10 mg total) by mouth daily.   aspirin 81 MG tablet Take 1 tablet by mouth daily.   bisoprolol-hydrochlorothiazide (ZIAC) 10-6.25 MG tablet TAKE 2 TABLETS BY MOUTH ONCE A DAY   clonazePAM (KLONOPIN) 0.5 MG tablet TAKE 1/2 TO 1 TABLET (0.25 TO 0.5 MG) BYMOUTH TWICE DAILY AS NEEDED FOR ANXIETY   DULoxetine (CYMBALTA) 30 MG capsule Take 1 capsule (30 mg total) by mouth daily.   gabapentin (NEURONTIN) 600 MG tablet TAKE 1 TABLET BY MOUTH 3 TIMES DAILY   meloxicam (MOBIC) 15 MG tablet Take 1 tablet (15 mg total) by mouth daily.   niacin 500 MG tablet Take 1 tablet by mouth daily.   OMEGA-3 FATTY ACIDS PO Take 1 capsule by mouth daily.   No facility-administered medications prior to visit.    Review of Systems  {Labs  Heme  Chem  Endocrine  Serology  Results Review (optional):23779}  Objective    There were no vitals taken for this visit. {Show previous vital signs (optional):23777}   Physical Exam  ***  Last depression screening scores    01/17/2020    4:14 PM 02/14/2016   10:44 AM 02/14/2016   10:00 AM  PHQ 2/9 Scores  PHQ - 2 Score 0 0 0   Last fall risk screening    01/17/2020    4:13 PM  Fall Risk   Falls in the past year? 1  Number falls in past yr: 0  Injury with Fall? 1   Last Audit-C alcohol use screening     No data to display         A score of 3 or more in women, and 4 or more in men indicates increased risk for alcohol abuse, EXCEPT if all of the points are from question 1   No results found for any visits on 04/09/22.  Assessment & Plan    Routine Health Maintenance and Physical Exam  Exercise Activities and Dietary recommendations  Goals   None      Immunization History  Administered Date(s) Administered   Influenza,inj,Quad PF,6+ Mos 06/22/2017   Tdap 12/11/2008    Health Maintenance  Topic Date Due   COVID-19 Vaccine (1) Never done   Zoster Vaccines- Shingrix (1 of 2) Never done   COLONOSCOPY (Pts 45-37yr Insurance coverage will need to be confirmed)  10/31/2016   TETANUS/TDAP  12/12/2018   INFLUENZA VACCINE  04/01/2022   Hepatitis C Screening  Completed   HIV Screening  Completed   HPV VACCINES  Aged Out    Discussed health benefits of physical activity, and encouraged him to engage in regular exercise appropriate for his age and condition.  ***  No follow-ups on file.     {provider attestation***:1}   DLelon Huh MD  [phone]) [phone](fax)  [place]

## 2022-04-09 ENCOUNTER — Encounter: Payer: Medicaid Other | Admitting: Family Medicine

## 2022-09-29 ENCOUNTER — Other Ambulatory Visit: Payer: Self-pay | Admitting: Family Medicine

## 2022-09-29 DIAGNOSIS — F439 Reaction to severe stress, unspecified: Secondary | ICD-10-CM

## 2022-09-30 NOTE — Telephone Encounter (Signed)
Requested medication (s) are due for refill today: yes  Requested medication (s) are on the active medication list: yes  Last refill:  04/01/22 #30  Future visit scheduled: no  Notes to clinic:  called pt to make appt - LM on VM to call back to schedule CPE   Requested Prescriptions  Pending Prescriptions Disp Refills   clonazePAM (KLONOPIN) 0.5 MG tablet [Pharmacy Med Name: CLONAZEPAM 0.5 MG TAB] 30 tablet     Sig: TAKE 1/2 TO 1 TABLET (0.25 TO 0.5 MG) BYMOUTH TWICE DAILY AS NEEDED FOR ANXIETY     Not Delegated - Psychiatry: Anxiolytics/Hypnotics 2 Failed - 09/29/2022  1:40 PM      Failed - This refill cannot be delegated      Failed - Urine Drug Screen completed in last 360 days      Failed - Valid encounter within last 6 months    Recent Outpatient Visits           2 years ago Chronic midline low back pain without sciatica   [place], Donald E, MD   2 years ago Chronic bilateral low back pain without sciatica   [place], Donald E, MD   4 years ago [place], [place]   5 years ago Essential (primary) hypertension   [place], [place]   5 years ago Need for influenza vaccination   [place], [place] - Patient is not pregnant

## 2022-10-02 DIAGNOSIS — Z419 Encounter for procedure for purposes other than remedying health state, unspecified: Secondary | ICD-10-CM | POA: Diagnosis not present

## 2022-10-31 DIAGNOSIS — Z419 Encounter for procedure for purposes other than remedying health state, unspecified: Secondary | ICD-10-CM | POA: Diagnosis not present

## 2022-11-18 NOTE — Progress Notes (Signed)
I,Sha'taria Tyson,acting as a Neurosurgeon for Mila Merry, MD.,have documented all relevant documentation on the behalf of Mila Merry, MD,as directed by  Mila Merry, MD while in the presence of Mila Merry, MD.        Patient: Stephen [name]   DOB: 11/18/1960   61 y.o. Male  MRN: [id number] Visit Date: 11/19/2022  Today's healthcare provider: Mila Merry, MD   No chief complaint on file.  Subjective    Stephen [name] is a 62 y.o. male who presents today for follow up of hypertension and hyperlipidema He reports consuming a general diet.  The patient reports his form of exercise is his physiacl activity that comes with him currently building a house.  He generally feels well. He reports sleeping well. He does not have additional problems to discuss today.   He was last seen 2021 at which time he was having a lot of issues with back pain, stress, and anxiety having lost job since then due to medical condition, partly from complications of Covid. He had no insurance for some time and has been taking blood pressure medications infrequently. He has recently been able to get on [place] Medicaid and reports he is now doing much better and working on building a home as above. Still looking for work. Has been completely out of his BP medications for several weeks, but was tolerating them well when he was taking them.    Past Medical History:  Diagnosis Date   Hyperlipidemia    Hypertension    Past Surgical History:  Procedure Laterality Date   COLONOSCOPY  10/31/2013   TONSILLECTOMY AND ADENOIDECTOMY     Social History   Socioeconomic History   Marital status: Married    Spouse name: Not on file   Number of children: Not on file   Years of education: 12   Highest education level: Not on file  Occupational History   Not on file  Tobacco Use   Smoking status: Never   Smokeless tobacco: Never  Substance and Sexual Activity   Alcohol use: Yes    Alcohol/week: 120.0 standard  drinks of alcohol    Types: 84 Cans of beer, 36 Shots of liquor per week    Comment: 12 cans of beer and 3 shots of liquor daily    Drug use: No   Sexual activity: Not on file  Other Topics Concern   Not on file  Social History Narrative   Not on file   Social Determinants of Health   Financial Resource Strain: Not on file  Food Insecurity: Not on file  Transportation Needs: Not on file  Physical Activity: Not on file  Stress: Not on file  Social Connections: Not on file  Intimate Partner Violence: Not on file   Family Status  Relation Name Status   Mother  Deceased       12/24/13   Father  Deceased at age 41S   Brother  Alive   Daughter  Alive   Son  Alive   Family History  Problem Relation Age of Onset   Congestive Heart Failure Mother    Hypertension Mother    COPD Father    Heart failure Father    HIV Brother    No Known Allergies  Patient Care Team: Malva Limes, MD as PCP - General (Family Medicine)   Medications: Outpatient Medications Prior to Visit  Medication Sig   aspirin 81 MG tablet Take 1 tablet by mouth daily.  niacin 500 MG tablet Take 1 tablet by mouth daily.   OMEGA-3 FATTY ACIDS PO Take 1 capsule by mouth daily.   amLODipine (NORVASC) 10 MG tablet Take 1 tablet (10 mg total) by mouth daily. (Patient not taking: Reported on 11/19/2022)   bisoprolol-hydrochlorothiazide (ZIAC) 10-6.25 MG tablet TAKE 2 TABLETS BY MOUTH ONCE A DAY (Patient not taking: Reported on 11/19/2022)   clonazePAM (KLONOPIN) 0.5 MG tablet TAKE 1/2 TO 1 TABLET (0.25 TO 0.5 MG) BYMOUTH TWICE DAILY AS NEEDED FOR ANXIETY (Patient not taking: Reported on 11/19/2022)   DULoxetine (CYMBALTA) 30 MG capsule Take 1 capsule (30 mg total) by mouth daily. (Patient not taking: Reported on 11/19/2022)   gabapentin (NEURONTIN) 600 MG tablet TAKE 1 TABLET BY MOUTH 3 TIMES DAILY (Patient not taking: Reported on 11/19/2022)   meloxicam (MOBIC) 15 MG tablet Take 1 tablet (15 mg total) by mouth  daily. (Patient not taking: Reported on 11/19/2022)   No facility-administered medications prior to visit.    Review of Systems  Constitutional: Negative.   HENT:  Positive for dental problem.   Eyes: Negative.   Respiratory: Negative.    Cardiovascular: Negative.   Gastrointestinal: Negative.   Endocrine: Negative.   Genitourinary: Negative.   Musculoskeletal:  Positive for arthralgias, back pain, neck pain and neck stiffness.  Skin: Negative.   Allergic/Immunologic: Negative.   Neurological: Negative.   Hematological: Negative.   Psychiatric/Behavioral: Negative.        Objective    BP (!) 199/89 (BP Location: Left Arm, Patient Position: Sitting, Cuff Size: Large)   Pulse 62   Wt 249 lb 14.4 oz (113.4 kg)   SpO2 100%   BMI 32.09 kg/m     Physical Exam   General: Appearance:    Mildly obese male in no acute distress  Eyes:    PERRL, conjunctiva/corneas clear, EOM's intact       Lungs:     Clear to auscultation bilaterally, respirations unlabored  Heart:    Normal heart rate. Normal rhythm. No murmurs, rubs, or gallops.    MS:   All extremities are intact.    Neurologic:   Awake, alert, oriented x 3. No apparent focal neurological defect.         Last depression screening scores    01/17/2020    4:14 PM 02/14/2016   10:44 AM 02/14/2016   10:00 AM  PHQ 2/9 Scores  PHQ - 2 Score 0 0 0   Last fall risk screening    01/17/2020    4:13 PM  Fall Risk   Falls in the past year? 1  Number falls in past yr: 0  Injury with Fall? 1   Last Audit-C alcohol use screening     No data to display         A score of 3 or more in women, and 4 or more in men indicates increased risk for alcohol abuse, EXCEPT if all of the points are from question 1     Assessment & Plan     1. Mixed hyperlipidemia Managed with diet and OTC supplements.  - CBC with Differential/Platelet - Comprehensive metabolic panel - Lipid Panel With LDL/HDL Ratio - TSH  2. Primary  hypertension Currently out of medications.   Restart at lower dose of  amLODipine (NORVASC) 5 MG tablet; Take 1 tablet (5 mg total) by mouth daily.  Dispense: 30 tablet; Refill: 2 and  bisoprolol-hydrochlorothiazide (ZIAC) 10-6.25 MG tablet; Take 1 tablet by mouth daily.  Dispense:  30 tablet; Refill: 1  Future Appointments  Date Time Provider Department Center  12/26/2022  9:40 AM Sherrie Mustache, Demetrios Isaacs, MD [facility]    3. Chronic midline low back pain without sciatica Much better since last visit. Is currently working on building house and managing fairly well. Off of meloxicam and gabapentin.   4. Prostate cancer screening  - PSA  5. Colon cancer screening  - Ambulatory referral to gastroenterology for colonoscopy  6. Personal history of colonic polyps  - Ambulatory referral to gastroenterology for colonoscopy  7. Prescription for Shingrix. Vaccine not administered in office.   - Zoster Vaccine Adjuvanted [facility]) injection; Inject 0.5 mLs into the muscle once for 1 dose. Repeat after 2 months  Dispense: 0.5 mL; Refill: 0  8. Prescription for Tdap. Vaccine not administered in office.   - Tdap (BOOSTRIX) 5-2.5-18.5 LF-MCG/0.5 injection; Inject 0.5 mLs into the muscle once for 1 dose.  Dispense: 0.5 mL; Refill: 0 - amLODipine (NORVASC) 5 MG tablet; Take 1 tablet (5 mg total) by mouth daily.  Dispense: 30 tablet; Refill: 2     The entirety of the information documented in the History of Present Illness, Review of Systems and Physical Exam were personally obtained by me. Portions of this information were initially documented by the CMA and reviewed by me for thoroughness and accuracy.     Mila Merry, MD  [facility] [phone] (phone) [phone] (fax)  [facility]

## 2022-11-19 ENCOUNTER — Encounter: Payer: Self-pay | Admitting: Family Medicine

## 2022-11-19 ENCOUNTER — Ambulatory Visit (INDEPENDENT_AMBULATORY_CARE_PROVIDER_SITE_OTHER): Payer: Medicaid Other | Admitting: Family Medicine

## 2022-11-19 VITALS — BP 199/89 | HR 62 | Wt 249.9 lb

## 2022-11-19 DIAGNOSIS — Z860100 Personal history of colon polyps, unspecified: Secondary | ICD-10-CM

## 2022-11-19 DIAGNOSIS — I10 Essential (primary) hypertension: Secondary | ICD-10-CM | POA: Diagnosis not present

## 2022-11-19 DIAGNOSIS — G8929 Other chronic pain: Secondary | ICD-10-CM | POA: Diagnosis not present

## 2022-11-19 DIAGNOSIS — Z125 Encounter for screening for malignant neoplasm of prostate: Secondary | ICD-10-CM

## 2022-11-19 DIAGNOSIS — Z289 Immunization not carried out for unspecified reason: Secondary | ICD-10-CM

## 2022-11-19 DIAGNOSIS — E782 Mixed hyperlipidemia: Secondary | ICD-10-CM | POA: Diagnosis not present

## 2022-11-19 DIAGNOSIS — Z23 Encounter for immunization: Secondary | ICD-10-CM

## 2022-11-19 DIAGNOSIS — M545 Low back pain, unspecified: Secondary | ICD-10-CM | POA: Diagnosis not present

## 2022-11-19 DIAGNOSIS — Z1211 Encounter for screening for malignant neoplasm of colon: Secondary | ICD-10-CM | POA: Diagnosis not present

## 2022-11-19 MED ORDER — SHINGRIX 50 MCG/0.5ML IM SUSR: 0.5000 mL | Freq: Once | INTRAMUSCULAR | 0 refills | Status: AC

## 2022-11-19 MED ORDER — AMLODIPINE BESYLATE 5 MG PO TABS: 5.0000 mg | ORAL_TABLET | Freq: Every day | ORAL | 2 refills | Status: DC

## 2022-11-19 MED ORDER — TETANUS-DIPHTH-ACELL PERTUSSIS 5-2.5-18.5 LF-MCG/0.5 IM SUSY: 0.5000 mL | PREFILLED_SYRINGE | Freq: Once | INTRAMUSCULAR | 0 refills | Status: AC

## 2022-11-19 MED ORDER — BISOPROLOL-HYDROCHLOROTHIAZIDE 10-6.25 MG PO TABS: 1.0000 | ORAL_TABLET | Freq: Every day | ORAL | 1 refills | Status: DC

## 2022-11-19 NOTE — Patient Instructions (Addendum)
Please review the attached list of medications and notify my office if there are any errors.  ° °The CDC recommends two doses of Shingrix (the shingles vaccine) separated by 2 to 6 months for adults age 61 years and older. I recommend checking with your insurance plan regarding coverage for this vaccine.   ° °You are due for a Tdap (tetanus-diptheria-pertussis vaccine) which protects you from tetanus and whooping cough. Please check with your insurance plan or pharmacy regarding coverage for this vaccine.  ° °

## 2022-11-20 LAB — COMPREHENSIVE METABOLIC PANEL
ALT: 25 IU/L (ref 0–44)
AST: 19 IU/L (ref 0–40)
Albumin/Globulin Ratio: 1.5 (ref 1.2–2.2)
Albumin: 4.3 g/dL (ref 3.8–4.9)
Alkaline Phosphatase: 84 IU/L (ref 44–121)
BUN/Creatinine Ratio: 11 (ref 10–24)
BUN: 9 mg/dL (ref 8–27)
Bilirubin Total: 0.5 mg/dL (ref 0.0–1.2)
CO2: 23 mmol/L (ref 20–29)
Calcium: 9.3 mg/dL (ref 8.6–10.2)
Chloride: 102 mmol/L (ref 96–106)
Creatinine, Ser: 0.84 mg/dL (ref 0.76–1.27)
Globulin, Total: 2.9 g/dL (ref 1.5–4.5)
Glucose: 97 mg/dL (ref 70–99)
Potassium: 4.1 mmol/L (ref 3.5–5.2)
Sodium: 144 mmol/L (ref 134–144)
Total Protein: 7.2 g/dL (ref 6.0–8.5)
eGFR: 100 mL/min/{1.73_m2} (ref >59)

## 2022-11-20 LAB — LIPID PANEL WITH LDL/HDL RATIO
Cholesterol, Total: 236 mg/dL — ABNORMAL HIGH (ref 100–199)
HDL: 49 mg/dL (ref >39)
LDL Chol Calc (NIH): 147 mg/dL — ABNORMAL HIGH (ref 0–99)
LDL/HDL Ratio: 3 ratio (ref 0.0–3.6)
Triglycerides: 222 mg/dL — ABNORMAL HIGH (ref 0–149)
VLDL Cholesterol Cal: 40 mg/dL (ref 5–40)

## 2022-11-20 LAB — CBC WITH DIFFERENTIAL/PLATELET
Basophils Absolute: 0 10*3/uL (ref 0.0–0.2)
Basos: 1 %
EOS (ABSOLUTE): 0.1 10*3/uL (ref 0.0–0.4)
Eos: 2 %
Hematocrit: 48 % (ref 37.5–51.0)
Hemoglobin: 16.4 g/dL (ref 13.0–17.7)
Immature Grans (Abs): 0 10*3/uL (ref 0.0–0.1)
Immature Granulocytes: 0 %
Lymphocytes Absolute: 1.6 10*3/uL (ref 0.7–3.1)
Lymphs: 29 %
MCH: 28.4 pg (ref 26.6–33.0)
MCHC: 34.2 g/dL (ref 31.5–35.7)
MCV: 83 fL (ref 79–97)
Monocytes Absolute: 0.4 10*3/uL (ref 0.1–0.9)
Monocytes: 8 %
Neutrophils Absolute: 3.2 10*3/uL (ref 1.4–7.0)
Neutrophils: 60 %
Platelets: 176 10*3/uL (ref 150–450)
RBC: 5.77 x10E6/uL (ref 4.14–5.80)
RDW: 12.2 % (ref 11.6–15.4)
WBC: 5.3 10*3/uL (ref 3.4–10.8)

## 2022-11-20 LAB — TSH: TSH: 1.92 u[IU]/mL (ref 0.450–4.500)

## 2022-11-20 LAB — PSA: Prostate Specific Ag, Serum: 0.7 ng/mL (ref 0.0–4.0)

## 2022-11-21 ENCOUNTER — Telehealth: Payer: Self-pay

## 2022-11-21 DIAGNOSIS — G8929 Other chronic pain: Secondary | ICD-10-CM

## 2022-11-21 DIAGNOSIS — F439 Reaction to severe stress, unspecified: Secondary | ICD-10-CM

## 2022-11-21 NOTE — Telephone Encounter (Signed)
Copied from [place] [id number]. Topic: General - Other >> Nov 21, 2022  9:21 AM Stephen [name] wrote: Reason for CRM: The patient would like to speak with a member of staff when possible about the status of their gabapentin (NEURONTIN) 600 MG tablet [id number]  DISCONTINUED and clonazePAM (KLONOPIN) 0.5 MG tablet [id number]  DISCONTINUED prescriptions  Please contact further when available

## 2022-11-26 MED ORDER — GABAPENTIN 600 MG PO TABS: 600.0000 mg | ORAL_TABLET | Freq: Three times a day (TID) | ORAL | 0 refills | Status: DC | PRN

## 2022-11-26 MED ORDER — CLONAZEPAM 0.5 MG PO TABS: 0.2500 mg | ORAL_TABLET | Freq: Two times a day (BID) | ORAL | 0 refills | Status: DC | PRN

## 2022-11-26 NOTE — Addendum Note (Signed)
Addended by: Birdie Sons on: 11/26/2022 04:10 PM   Modules accepted: Orders

## 2022-12-01 DIAGNOSIS — Z419 Encounter for procedure for purposes other than remedying health state, unspecified: Secondary | ICD-10-CM | POA: Diagnosis not present

## 2022-12-26 ENCOUNTER — Ambulatory Visit (INDEPENDENT_AMBULATORY_CARE_PROVIDER_SITE_OTHER): Payer: Medicaid Other | Admitting: Family Medicine

## 2022-12-26 ENCOUNTER — Encounter: Payer: Self-pay | Admitting: Family Medicine

## 2022-12-26 VITALS — BP 163/81 | HR 54 | Ht 74.0 in | Wt 247.0 lb

## 2022-12-26 DIAGNOSIS — I10 Essential (primary) hypertension: Secondary | ICD-10-CM | POA: Diagnosis not present

## 2022-12-26 DIAGNOSIS — F439 Reaction to severe stress, unspecified: Secondary | ICD-10-CM | POA: Diagnosis not present

## 2022-12-26 DIAGNOSIS — M545 Low back pain, unspecified: Secondary | ICD-10-CM | POA: Diagnosis not present

## 2022-12-26 DIAGNOSIS — G8929 Other chronic pain: Secondary | ICD-10-CM | POA: Diagnosis not present

## 2022-12-26 MED ORDER — CLONAZEPAM 0.5 MG PO TABS: 0.2500 mg | ORAL_TABLET | Freq: Two times a day (BID) | ORAL | 1 refills | Status: DC | PRN

## 2022-12-26 MED ORDER — GABAPENTIN 600 MG PO TABS: 600.0000 mg | ORAL_TABLET | Freq: Three times a day (TID) | ORAL | 3 refills | Status: DC | PRN

## 2022-12-26 MED ORDER — AMLODIPINE BESYLATE 10 MG PO TABS: 10.0000 mg | ORAL_TABLET | Freq: Every day | ORAL | 1 refills | Status: DC

## 2022-12-26 NOTE — Patient Instructions (Signed)
.   Please review the attached list of medications and notify my office if there are any errors.   . Please bring all of your medications to every appointment so we can make sure that our medication list is the same as yours.   

## 2022-12-26 NOTE — Progress Notes (Signed)
      Established patient visit   Patient: Stephen [name]   DOB: [birth year]   62 y.o. Male  MRN: [id number] Visit Date: 12/26/2022  Today's healthcare provider: Mila Merry, MD    Subjective     Follow up since 11/19/22 visit at which time he was started back on amlodipine and bisoprolol-hctz at half dose he had taken years ago. BP at that visit was 199/89. States he feels very good with no adverse effects of medications. Is taking consisently.   Medications: Outpatient Medications Prior to Visit  Medication Sig   amLODipine (NORVASC) 5 MG tablet Take 1 tablet (5 mg total) by mouth daily.   aspirin 81 MG tablet Take 1 tablet by mouth daily.   bisoprolol-hydrochlorothiazide (ZIAC) 10-6.25 MG tablet Take 1 tablet by mouth daily.   clonazePAM (KLONOPIN) 0.5 MG tablet Take 0.5-1 tablets (0.25-0.5 mg total) by mouth 2 (two) times daily as needed for anxiety.   gabapentin (NEURONTIN) 600 MG tablet Take 1 tablet (600 mg total) by mouth 3 (three) times daily as needed.   niacin 500 MG tablet Take 1 tablet by mouth daily.   OMEGA-3 FATTY ACIDS PO Take 1 capsule by mouth daily.   No facility-administered medications prior to visit.    Review of Systems  Constitutional:  Negative for appetite change, chills and fever.  Respiratory:  Negative for chest tightness, shortness of breath and wheezing.   Cardiovascular:  Negative for chest pain and palpitations.  Gastrointestinal:  Negative for abdominal pain, nausea and vomiting.       Objective    BP (!) 163/81 (BP Location: Right Arm, Patient Position: Sitting, Cuff Size: Large)   Pulse (!) 54   Ht 6\' 2"  (1.88 m)   Wt 247 lb (112 kg)   SpO2 100%   BMI 31.71 kg/m    Physical Exam   General appearance: Mildly obese male, cooperative and in no acute distress Head: Normocephalic, without obvious abnormality, atraumatic Respiratory: Respirations even and unlabored, normal respiratory rate Extremities: All extremities are intact.   Skin: Skin color, texture, turgor normal. No rashes seen  Psych: Appropriate mood and affect. Neurologic: Mental status: Alert, oriented to person, place, and time, thought content appropriate.    Assessment & Plan     1. Primary hypertension Much better since restarting amlodipine and bisoprolol-hctz. In past he had been on twice dose he is currently taking.   Will increase amLODipine (NORVASC) 10 MG tablet; to 1 tablet (10 mg total) by mouth daily.  Dispense: 90 tablet; Refill: 1  Recheck in 2 months. Consider doubling bisoprolol-hctz at next visit if BP is not down to goal.   2. Chronic midline low back pain without sciatica   3. Chronic bilateral low back pain without sciatica refill gabapentin (NEURONTIN) 600 MG tablet; Take 1 tablet (600 mg total) by mouth 3 (three) times daily as needed.  Dispense: 90 tablet; Refill: 3  4. Situational stress refill clonazePAM (KLONOPIN) 0.5 MG tablet; Take 0.5-1 tablets (0.25-0.5 mg total) by mouth 2 (two) times daily as needed for anxiety.  Dispense: 30 tablet; Refill: 1         Mila Merry, MD  [facility] [phone] (phone) [phone] (fax)  [facility]

## 2022-12-31 DIAGNOSIS — Z419 Encounter for procedure for purposes other than remedying health state, unspecified: Secondary | ICD-10-CM | POA: Diagnosis not present

## 2023-01-07 ENCOUNTER — Other Ambulatory Visit: Payer: Self-pay | Admitting: Family Medicine

## 2023-01-07 DIAGNOSIS — F439 Reaction to severe stress, unspecified: Secondary | ICD-10-CM

## 2023-01-07 NOTE — Telephone Encounter (Signed)
Requested medication (s) are due for refill today: Due 01/25/23  Requested medication (s) are on the active medication list: yes    Last refill: 12/26/22  #30  1 refill  Future visit scheduled yes 03/27/23  Notes to clinic: Not delegated, please review. Thank you.  Requested Prescriptions  Pending Prescriptions Disp Refills   clonazePAM (KLONOPIN) 0.5 MG tablet [Pharmacy Med Name: CLONAZEPAM 0.5MG  TABLET] 30 tablet 1    Sig: TAKE 0.5-1 TABLETS (0.25-0.5 MG TOTAL) BY MOUTH TWO (TWO) TIMES DAILY AS NEEDED FOR ANXIETY.     Not Delegated - Psychiatry: Anxiolytics/Hypnotics 2 Failed - 01/07/2023  8:40 AM      Failed - This refill cannot be delegated      Failed - Urine Drug Screen completed in last 360 days      Passed - Patient is not pregnant      Passed - Valid encounter within last 6 months    Recent Outpatient Visits           1 week ago Primary hypertension   [place] [facility] Malva Limes, MD   1 month ago Mixed hyperlipidemia   [place] [facility] Malva Limes, MD   2 years ago Chronic midline low back pain without sciatica   [place] [facility] Malva Limes, MD   2 years ago Chronic bilateral low back pain without sciatica   [facility] Malva Limes, MD   4 years ago Situational stress   [facility], [place], [place]       Future Appointments             In 2 months Fisher, Demetrios Isaacs, MD [facility]

## 2023-01-19 ENCOUNTER — Other Ambulatory Visit: Payer: Self-pay | Admitting: Family Medicine

## 2023-01-19 DIAGNOSIS — F439 Reaction to severe stress, unspecified: Secondary | ICD-10-CM

## 2023-01-31 ENCOUNTER — Other Ambulatory Visit: Payer: Self-pay | Admitting: Family Medicine

## 2023-01-31 DIAGNOSIS — Z419 Encounter for procedure for purposes other than remedying health state, unspecified: Secondary | ICD-10-CM | POA: Diagnosis not present

## 2023-01-31 DIAGNOSIS — I10 Essential (primary) hypertension: Secondary | ICD-10-CM

## 2023-02-02 NOTE — Telephone Encounter (Signed)
Requested Prescriptions  Pending Prescriptions Disp Refills   bisoprolol-hydrochlorothiazide (ZIAC) 10-6.25 MG tablet [Pharmacy Med Name: BISOPROLOL FUMARATE/HYDROCHLOROTHIAZIDE 10/6.25 TABLET] 90 tablet 0    Sig: TAKE ONE TABLET BY MOUTH DAILY.     Cardiovascular: Beta Blocker + Diuretic Combos Failed - 01/31/2023  9:59 AM      Failed - Last BP in normal range    BP Readings from Last 1 Encounters:  12/26/22 (!) 163/81         Passed - K in normal range and within 180 days    Potassium  Date Value Ref Range Status  11/19/2022 4.1 3.5 - 5.2 mmol/L Final         Passed - Na in normal range and within 180 days    Sodium  Date Value Ref Range Status  11/19/2022 144 134 - 144 mmol/L Final         Passed - Cr in normal range and within 180 days    Creat  Date Value Ref Range Status  06/22/2017 0.79 0.70 - 1.33 mg/dL Final    Comment:    For patients >36 years of age, the reference limit for Creatinine is approximately 13% higher for people identified as African-American. .    Creatinine, Ser  Date Value Ref Range Status  11/19/2022 0.84 0.76 - 1.27 mg/dL Final         Passed - eGFR in normal range and within 180 days    GFR, Est African American  Date Value Ref Range Status  06/22/2017 117 > OR = 60 mL/min/1.47m2 Final   GFR calc Af Amer  Date Value Ref Range Status  01/18/2020 118 >59 mL/min/1.73 Final    Comment:    **[facility] currently reports eGFR in compliance with the current**   recommendations of the [employer]. [facility] will   update reporting as new guidelines are published from the NKF-ASN   Task force.    GFR, Est Non African American  Date Value Ref Range Status  06/22/2017 101 > OR = 60 mL/min/1.23m2 Final   GFR calc non Af Amer  Date Value Ref Range Status  01/18/2020 102 >59 mL/min/1.73 Final   eGFR  Date Value Ref Range Status  11/19/2022 100 >59 mL/min/1.73 Final         Passed - Last Heart Rate in normal range    Pulse  Readings from Last 1 Encounters:  12/26/22 (!) 54         Passed - Valid encounter within last 6 months    Recent Outpatient Visits           1 month ago Primary hypertension   [place] [facility] Malva Limes, MD   2 months ago Mixed hyperlipidemia   [place] [facility] Malva Limes, MD   2 years ago Chronic midline low back pain without sciatica    [facility] Malva Limes, MD   3 years ago Chronic bilateral low back pain without sciatica   [facility] Malva Limes, MD   4 years ago Situational stress   [facility] [place], [place]       Future Appointments             In 1 month Fisher, Demetrios Isaacs, MD [facility]

## 2023-03-02 ENCOUNTER — Inpatient Hospital Stay: Payer: Medicaid Other

## 2023-03-02 ENCOUNTER — Other Ambulatory Visit (HOSPITAL_COMMUNITY): Payer: Self-pay

## 2023-03-02 ENCOUNTER — Ambulatory Visit: Payer: Self-pay

## 2023-03-02 ENCOUNTER — Other Ambulatory Visit: Payer: Self-pay

## 2023-03-02 ENCOUNTER — Inpatient Hospital Stay
Admission: EM | Admit: 2023-03-02 | Discharge: 2023-03-07 | DRG: 308 | Disposition: A | Discharge status: Home / Self Care | Payer: Medicaid Other | Attending: Internal Medicine | Admitting: Internal Medicine

## 2023-03-02 ENCOUNTER — Emergency Department: Payer: Medicaid Other

## 2023-03-02 DIAGNOSIS — I5021 Acute systolic (congestive) heart failure: Secondary | ICD-10-CM | POA: Diagnosis present

## 2023-03-02 DIAGNOSIS — F1010 Alcohol abuse, uncomplicated: Secondary | ICD-10-CM | POA: Diagnosis not present

## 2023-03-02 DIAGNOSIS — I361 Nonrheumatic tricuspid (valve) insufficiency: Secondary | ICD-10-CM | POA: Diagnosis not present

## 2023-03-02 DIAGNOSIS — Z825 Family history of asthma and other chronic lower respiratory diseases: Secondary | ICD-10-CM | POA: Diagnosis not present

## 2023-03-02 DIAGNOSIS — I959 Hypotension, unspecified: Secondary | ICD-10-CM | POA: Diagnosis not present

## 2023-03-02 DIAGNOSIS — I509 Heart failure, unspecified: Secondary | ICD-10-CM

## 2023-03-02 DIAGNOSIS — E876 Hypokalemia: Secondary | ICD-10-CM | POA: Diagnosis not present

## 2023-03-02 DIAGNOSIS — I4891 Unspecified atrial fibrillation: Secondary | ICD-10-CM

## 2023-03-02 DIAGNOSIS — F419 Anxiety disorder, unspecified: Secondary | ICD-10-CM | POA: Diagnosis not present

## 2023-03-02 DIAGNOSIS — I5020 Unspecified systolic (congestive) heart failure: Secondary | ICD-10-CM | POA: Diagnosis not present

## 2023-03-02 DIAGNOSIS — E782 Mixed hyperlipidemia: Secondary | ICD-10-CM | POA: Diagnosis not present

## 2023-03-02 DIAGNOSIS — Z7982 Long term (current) use of aspirin: Secondary | ICD-10-CM

## 2023-03-02 DIAGNOSIS — I10 Essential (primary) hypertension: Secondary | ICD-10-CM | POA: Diagnosis not present

## 2023-03-02 DIAGNOSIS — K828 Other specified diseases of gallbladder: Secondary | ICD-10-CM | POA: Diagnosis not present

## 2023-03-02 DIAGNOSIS — I5023 Acute on chronic systolic (congestive) heart failure: Secondary | ICD-10-CM | POA: Diagnosis not present

## 2023-03-02 DIAGNOSIS — E785 Hyperlipidemia, unspecified: Secondary | ICD-10-CM | POA: Diagnosis present

## 2023-03-02 DIAGNOSIS — R000 Tachycardia, unspecified: Secondary | ICD-10-CM | POA: Diagnosis present

## 2023-03-02 DIAGNOSIS — I420 Dilated cardiomyopathy: Secondary | ICD-10-CM | POA: Diagnosis not present

## 2023-03-02 DIAGNOSIS — I4819 Other persistent atrial fibrillation: Secondary | ICD-10-CM | POA: Diagnosis not present

## 2023-03-02 DIAGNOSIS — Z79899 Other long term (current) drug therapy: Secondary | ICD-10-CM | POA: Diagnosis not present

## 2023-03-02 DIAGNOSIS — R7989 Other specified abnormal findings of blood chemistry: Secondary | ICD-10-CM

## 2023-03-02 DIAGNOSIS — Z419 Encounter for procedure for purposes other than remedying health state, unspecified: Secondary | ICD-10-CM | POA: Diagnosis not present

## 2023-03-02 DIAGNOSIS — R918 Other nonspecific abnormal finding of lung field: Secondary | ICD-10-CM | POA: Diagnosis not present

## 2023-03-02 DIAGNOSIS — I340 Nonrheumatic mitral (valve) insufficiency: Secondary | ICD-10-CM | POA: Diagnosis not present

## 2023-03-02 DIAGNOSIS — I429 Cardiomyopathy, unspecified: Secondary | ICD-10-CM | POA: Diagnosis not present

## 2023-03-02 DIAGNOSIS — R6881 Early satiety: Secondary | ICD-10-CM | POA: Diagnosis present

## 2023-03-02 DIAGNOSIS — G8929 Other chronic pain: Secondary | ICD-10-CM | POA: Diagnosis not present

## 2023-03-02 DIAGNOSIS — I110 Hypertensive heart disease with heart failure: Secondary | ICD-10-CM | POA: Diagnosis not present

## 2023-03-02 DIAGNOSIS — I351 Nonrheumatic aortic (valve) insufficiency: Secondary | ICD-10-CM | POA: Diagnosis not present

## 2023-03-02 DIAGNOSIS — Z7901 Long term (current) use of anticoagulants: Secondary | ICD-10-CM

## 2023-03-02 DIAGNOSIS — R0602 Shortness of breath: Secondary | ICD-10-CM | POA: Diagnosis not present

## 2023-03-02 DIAGNOSIS — I482 Chronic atrial fibrillation, unspecified: Secondary | ICD-10-CM | POA: Diagnosis not present

## 2023-03-02 DIAGNOSIS — Z8249 Family history of ischemic heart disease and other diseases of the circulatory system: Secondary | ICD-10-CM | POA: Diagnosis not present

## 2023-03-02 DIAGNOSIS — R188 Other ascites: Secondary | ICD-10-CM | POA: Diagnosis not present

## 2023-03-02 DIAGNOSIS — J90 Pleural effusion, not elsewhere classified: Secondary | ICD-10-CM | POA: Diagnosis not present

## 2023-03-02 DIAGNOSIS — R1084 Generalized abdominal pain: Secondary | ICD-10-CM | POA: Diagnosis not present

## 2023-03-02 DIAGNOSIS — Z01810 Encounter for preprocedural cardiovascular examination: Secondary | ICD-10-CM | POA: Diagnosis not present

## 2023-03-02 LAB — URINALYSIS, ROUTINE W REFLEX MICROSCOPIC
Bilirubin Urine: NEGATIVE
Glucose, UA: NEGATIVE mg/dL
Hgb urine dipstick: NEGATIVE
Ketones, ur: NEGATIVE mg/dL
Leukocytes,Ua: NEGATIVE
Nitrite: NEGATIVE
Protein, ur: NEGATIVE mg/dL
Specific Gravity, Urine: 1.009 (ref 1.005–1.030)
pH: 5 (ref 5.0–8.0)

## 2023-03-02 LAB — CBC
HCT: 44.4 % (ref 39.0–52.0)
Hemoglobin: 14.3 g/dL (ref 13.0–17.0)
MCH: 27.4 pg (ref 26.0–34.0)
MCHC: 32.2 g/dL (ref 30.0–36.0)
MCV: 85.2 fL (ref 80.0–100.0)
Platelets: 148 10*3/uL — ABNORMAL LOW (ref 150–400)
RBC: 5.21 MIL/uL (ref 4.22–5.81)
RDW: 13 % (ref 11.5–15.5)
WBC: 6.2 10*3/uL (ref 4.0–10.5)
nRBC: 0 % (ref 0.0–0.2)

## 2023-03-02 LAB — COMPREHENSIVE METABOLIC PANEL
ALT: 46 U/L — ABNORMAL HIGH (ref 0–44)
AST: 24 U/L (ref 15–41)
Albumin: 3.8 g/dL (ref 3.5–5.0)
Alkaline Phosphatase: 79 U/L (ref 38–126)
Anion gap: 8 (ref 5–15)
BUN: 20 mg/dL (ref 6–20)
CO2: 22 mmol/L (ref 22–32)
Calcium: 8.7 mg/dL — ABNORMAL LOW (ref 8.9–10.3)
Chloride: 108 mmol/L (ref 98–111)
Creatinine, Ser: 0.85 mg/dL (ref 0.61–1.24)
GFR, Estimated: >60 mL/min (ref >60)
Glucose, Bld: 107 mg/dL — ABNORMAL HIGH (ref 70–99)
Potassium: 3.8 mmol/L (ref 3.5–5.1)
Sodium: 138 mmol/L (ref 135–145)
Total Bilirubin: 1.1 mg/dL (ref 0.3–1.2)
Total Protein: 6.7 g/dL (ref 6.5–8.1)

## 2023-03-02 LAB — TROPONIN I (HIGH SENSITIVITY): Troponin I (High Sensitivity): 7 ng/L (ref <18)

## 2023-03-02 LAB — BRAIN NATRIURETIC PEPTIDE: B Natriuretic Peptide: 680 pg/mL — ABNORMAL HIGH (ref 0.0–100.0)

## 2023-03-02 LAB — LIPASE, BLOOD: Lipase: 38 U/L (ref 11–51)

## 2023-03-02 LAB — HIV ANTIBODY (ROUTINE TESTING W REFLEX): HIV Screen 4th Generation wRfx: NONREACTIVE

## 2023-03-02 LAB — PROTIME-INR
INR: 1.3 — ABNORMAL HIGH (ref 0.8–1.2)
Prothrombin Time: 16.4 seconds — ABNORMAL HIGH (ref 11.4–15.2)

## 2023-03-02 LAB — TSH: TSH: 2.28 u[IU]/mL (ref 0.350–4.500)

## 2023-03-02 MED ORDER — GABAPENTIN 300 MG PO CAPS
600.0000 mg | ORAL_CAPSULE | Freq: Three times a day (TID) | ORAL | Status: DC | PRN
  Administered 2023-03-03 – 2023-03-04 (×3): 600 mg via ORAL
  Filled 2023-03-02 (×3): qty 2

## 2023-03-02 MED ORDER — THIAMINE MONONITRATE 100 MG PO TABS
100.0000 mg | ORAL_TABLET | Freq: Every day | ORAL | Status: DC
  Administered 2023-03-02 – 2023-03-07 (×6): 100 mg via ORAL
  Filled 2023-03-02 (×6): qty 1

## 2023-03-02 MED ORDER — ONDANSETRON HCL 4 MG/2ML IJ SOLN: 4.0000 mg | Freq: Four times a day (QID) | INTRAMUSCULAR | Status: DC | PRN

## 2023-03-02 MED ORDER — METOPROLOL TARTRATE 5 MG/5ML IV SOLN
2.5000 mg | INTRAVENOUS | Status: DC | PRN
  Administered 2023-03-02: 2.5 mg via INTRAVENOUS
  Filled 2023-03-02: qty 5

## 2023-03-02 MED ORDER — AMIODARONE LOAD VIA INFUSION
150.0000 mg | Freq: Once | INTRAVENOUS | Status: AC
  Administered 2023-03-02: 150 mg via INTRAVENOUS
  Filled 2023-03-02: qty 83.34

## 2023-03-02 MED ORDER — FOLIC ACID 1 MG PO TABS
1.0000 mg | ORAL_TABLET | Freq: Every day | ORAL | Status: DC
  Administered 2023-03-02 – 2023-03-07 (×6): 1 mg via ORAL
  Filled 2023-03-02 (×6): qty 1

## 2023-03-02 MED ORDER — METOPROLOL TARTRATE 5 MG/5ML IV SOLN
2.5000 mg | INTRAVENOUS | Status: AC | PRN
  Administered 2023-03-02 (×2): 2.5 mg via INTRAVENOUS
  Filled 2023-03-02: qty 5

## 2023-03-02 MED ORDER — ASPIRIN 81 MG PO TBEC
81.0000 mg | DELAYED_RELEASE_TABLET | Freq: Every day | ORAL | Status: DC
  Administered 2023-03-02 – 2023-03-07 (×6): 81 mg via ORAL
  Filled 2023-03-02 (×6): qty 1

## 2023-03-02 MED ORDER — FUROSEMIDE 10 MG/ML IJ SOLN
40.0000 mg | Freq: Every day | INTRAMUSCULAR | Status: DC
  Administered 2023-03-03: 40 mg via INTRAVENOUS
  Filled 2023-03-02: qty 4

## 2023-03-02 MED ORDER — AMIODARONE HCL IN DEXTROSE 360-4.14 MG/200ML-% IV SOLN
30.0000 mg/h | INTRAVENOUS | Status: DC
  Administered 2023-03-03 – 2023-03-07 (×9): 30 mg/h via INTRAVENOUS
  Filled 2023-03-02 (×9): qty 200

## 2023-03-02 MED ORDER — CLONAZEPAM 0.5 MG PO TABS
0.5000 mg | ORAL_TABLET | Freq: Two times a day (BID) | ORAL | Status: DC | PRN
  Administered 2023-03-03 – 2023-03-05 (×4): 0.5 mg via ORAL
  Filled 2023-03-02 (×4): qty 1

## 2023-03-02 MED ORDER — ONDANSETRON HCL 4 MG PO TABS: 4.0000 mg | ORAL_TABLET | Freq: Four times a day (QID) | ORAL | Status: DC | PRN

## 2023-03-02 MED ORDER — ACETAMINOPHEN 650 MG RE SUPP: 650.0000 mg | Freq: Four times a day (QID) | RECTAL | Status: DC | PRN

## 2023-03-02 MED ORDER — APIXABAN 5 MG PO TABS
5.0000 mg | ORAL_TABLET | Freq: Two times a day (BID) | ORAL | Status: DC
  Administered 2023-03-02 – 2023-03-07 (×10): 5 mg via ORAL
  Filled 2023-03-02 (×10): qty 1

## 2023-03-02 MED ORDER — ACETAMINOPHEN 325 MG PO TABS: 650.0000 mg | ORAL_TABLET | Freq: Four times a day (QID) | ORAL | Status: DC | PRN

## 2023-03-02 MED ORDER — SODIUM CHLORIDE 0.9% FLUSH
3.0000 mL | Freq: Two times a day (BID) | INTRAVENOUS | Status: DC
  Administered 2023-03-02 – 2023-03-07 (×8): 3 mL via INTRAVENOUS

## 2023-03-02 MED ORDER — POLYETHYLENE GLYCOL 3350 17 G PO PACK: 17.0000 g | PACK | Freq: Every day | ORAL | Status: DC | PRN

## 2023-03-02 MED ORDER — FUROSEMIDE 10 MG/ML IJ SOLN
40.0000 mg | Freq: Once | INTRAMUSCULAR | Status: AC
  Administered 2023-03-02: 40 mg via INTRAVENOUS
  Filled 2023-03-02: qty 4

## 2023-03-02 MED ORDER — DILTIAZEM HCL 25 MG/5ML IV SOLN
10.0000 mg | Freq: Once | INTRAVENOUS | Status: AC
  Administered 2023-03-02: 10 mg via INTRAVENOUS
  Filled 2023-03-02: qty 5

## 2023-03-02 MED ORDER — AMIODARONE HCL IN DEXTROSE 360-4.14 MG/200ML-% IV SOLN
60.0000 mg/h | INTRAVENOUS | Status: AC
  Administered 2023-03-02 (×2): 60 mg/h via INTRAVENOUS
  Filled 2023-03-02 (×2): qty 200

## 2023-03-02 NOTE — Assessment & Plan Note (Signed)
Blood pressure within goal at this time.  - Hold home antihypertensives, restart if needed

## 2023-03-02 NOTE — H&P (Signed)
History and Physical    Patient: Stephen [name] ZOX:[id number] DOB: [birth year] DOA: 03/02/2023 DOS: the patient was seen and examined on 03/02/2023 PCP: Malva Limes, MD  Patient coming from: Home  Chief Complaint:  Chief Complaint  Patient presents with   Shortness of Breath   Abdominal Pain   HPI: Stephen [name] is a 62 y.o. male with medical history significant of hypertension, hyperlipidemia, chronic back pain, anxiety, who presents to the ED due to shortness of breath and abdominal pain.  Mr. Dechristopher states that for the last 6 weeks, he has been experiencing generalized abdominal pain that feels like he "just ate at a buffet" with early satiety.  Then he noticed that his abdomen was getting more distended and he was having to go further out on his belt buckle.  Over the last several weeks, he has been experiencing significant shortness of breath at night when trying to lay flat that he describes as suffocating.  When he would wake up during these moments, his wife, who is an Char[employer] fundraiser, will check his oxygen and heart rate.  He states that his heart rate would be very high and his oxygen would be in the mid 80s but would recover.  Over the last couple days, he has noticed quickly progressing lower extremity swelling.  He called his PCP today who told him to go to the ED.  He denies any chest pain, palpitations, nausea, vomiting, fever, chills, recent illness.  Mr. Lare states that he was a heavy drinker with at least a case of beer per day for approximately 15 years.  He now only has a maximum of 2 beers per day and has been doing so for 2 years.  He denies any history of withdrawals.  ED course: On arrival to the ED, patient was normotensive at 120/96 with heart rate of 134.  He was saturating at 98% on room air.  He was afebrile at 97.6.  Initial workup demonstrated CBC with platelets of 148, and CMP with potassium 3.8, bicarb 22, glucose 107, creatinine 0.85, ALT 46 and GFR above 60.  BNP  elevated 680.EKG demonstrated atrial fibrillation with RVR.  Patient given one-time dose of diltiazem with improvement in rates.  Chest x-ray obtained demonstrating bilateral small pleural effusions.  Patient started on Lasix and [facility] contacted for admission.  Review of Systems: As mentioned in the history of present illness. All other systems reviewed and are negative.  Past Medical History:  Diagnosis Date   Hyperlipidemia    Hypertension    Past Surgical History:  Procedure Laterality Date   COLONOSCOPY  10/31/2013   TONSILLECTOMY AND ADENOIDECTOMY     Social History:  reports that he has never smoked. He has never used smokeless tobacco. He reports current alcohol use of about 120.0 standard drinks of alcohol per week. He reports that he does not use drugs.  No Known Allergies  Family History  Problem Relation Age of Onset   Congestive Heart Failure Mother    Hypertension Mother    COPD Father    Heart failure Father    HIV Brother     Prior to Admission medications   Medication Sig Start Date End Date Taking? Authorizing Provider  amLODipine (NORVASC) 5 MG tablet Take 5 mg by mouth daily. 01/22/23  Yes [provider]  amLODipine (NORVASC) 10 MG tablet Take 1 tablet (10 mg total) by mouth daily. 12/26/22   Malva Limes, MD  aspirin 81 MG tablet Take  1 tablet by mouth daily.    [provider]  bisoprolol-hydrochlorothiazide (ZIAC) 10-6.25 MG tablet TAKE ONE TABLET BY MOUTH DAILY. 02/02/23   Malva Limes, MD  clonazePAM (KLONOPIN) 0.5 MG tablet TAKE 0.5-1 TABLETS (0.25-0.5 MG TOTAL) BY MOUTH TWO (TWO) TIMES DAILY AS NEEDED FOR ANXIETY. 01/22/23   Malva Limes, MD  gabapentin (NEURONTIN) 600 MG tablet Take 1 tablet (600 mg total) by mouth 3 (three) times daily as needed. 12/26/22   Malva Limes, MD  niacin 500 MG tablet Take 1 tablet by mouth daily. 04/09/11   [provider]  OMEGA-3 FATTY ACIDS PO Take 1 capsule by mouth daily. 04/09/11    [provider]    Physical Exam: Vitals:   03/02/23 1000 03/02/23 1001 03/02/23 1003  BP: (!) 120/96    Pulse:   (!) 127  Resp: (!) 22    Temp: 97.6 F (36.4 C)    SpO2:   98%  Weight:  113.4 kg   Height:  6\' 2"  (1.88 m)    Physical Exam Vitals and nursing note reviewed.  Constitutional:      General: He is not in acute distress. HENT:     Head: Normocephalic and atraumatic.     Mouth/Throat:     Mouth: Mucous membranes are moist.     Pharynx: Oropharynx is clear.  Cardiovascular:     Rate and Rhythm: Tachycardia present. Rhythm irregularly irregular.     Pulses: Normal pulses.     Heart sounds: No murmur heard.    No gallop.  Pulmonary:     Effort: Pulmonary effort is normal. No tachypnea or respiratory distress.     Breath sounds: Rales (Bilateral fine rales in bases) present.  Abdominal:     General: Bowel sounds are normal. There is distension.     Tenderness: There is abdominal tenderness (Diffuse tenderness throughout, patient says due to distention). There is no guarding.     Hernia: No hernia is present.  Musculoskeletal:     Right lower leg: 2+ Pitting Edema present.     Left lower leg: 2+ Pitting Edema present.  Skin:    General: Skin is warm and dry.     Findings: No ecchymosis or rash.  Neurological:     General: No focal deficit present.     Mental Status: He is alert and oriented to person, place, and time.  Psychiatric:        Mood and Affect: Mood normal.        Behavior: Behavior normal.    Data Reviewed: CBC with WBC of 6.2, hemoglobin 14.3, MCV of 85, platelets of 148 CMP with sodium of 138, potassium 3.8, bicarb 22, glucose 107, BUN 20, creatinine 0.85 with GFR of over 60, AST 24, ALT 46 BNP elevated 680  EKG personally reviewed.  Atrial fibrillation with rate of 127.  No ST or T wave changes consistent with acute ischemia.  DG Chest 2 View  Result Date: 03/02/2023 CLINICAL DATA:  Shortness of breath EXAM: CHEST - 2 VIEW  COMPARISON:  None Available. FINDINGS: Tiny pleural effusions. Mild linear opacity in the midlung zone on the left and at both lung bases likely scar or atelectasis. No pneumothorax or edema. Enlarged cardiopericardial silhouette. Degenerative changes of the spine IMPRESSION: Tiny pleural effusions. Enlarged heart with some mild scar or atelectasis. Electronically Signed   By: Karen Kays M.D.   On: 03/02/2023 10:32    Results are pending, will review when available.  Assessment and Plan:  * Acute heart failure [facility]) Patient presenting with several week history of dyspnea on exertion, lower extremity swelling, orthopnea and abdominal bloating.  BNP elevated at 680 consistent with new diagnosis of acute heart failure.  Likely due to long-term uncontrolled hypertension.  - Telemetry monitoring - Echocardiogram ordered - Continue Lasix 40 mg IV daily with goal diuresis of 2-3 L per 24-hour. - Daily weights - Strict in and out - Daily monitoring of potassium magnesium  Atrial fibrillation with RVR (HCC) Patient presenting with new onset atrial fibrillation with RVR.  Rates have improved with one-time dose of diltiazem.  Will avoid further rate controlling agents given new onset heart failure with unknown EF.  I suspect rates will improve with diuresis.  CHA2DS2-VASc of 2 for hypertension and heart failure.  - Telemetry monitoring - Metoprolol IV push for sustained rates above 115 - Start Eliquis 5 mg twice daily  Elevated LFTs Patient reports abdominal distention was one of his first symptoms, and given mildly elevated ALT and mildly low platelets, will evaluate additional liver functions with PT/INR and and obtain a right upper quadrant ultrasound with evaluation for ascites as well.   - Right upper quadrant ultrasound - PT/INR  Primary hypertension Blood pressure within goal at this time.  - Hold home antihypertensives, restart if needed  HLD (hyperlipidemia) Last lipid panel with  LDL of 147.  - Pending hepatic workup, will need to start high intensity statin  Advance Care Planning:   Code Status: Full Code verified by patient  Consults: None  Family Communication: No family at bedside, patient states he will update his wife  Severity of Illness: The appropriate patient status for this patient is INPATIENT. Inpatient status is judged to be reasonable and necessary in order to provide the required intensity of service to ensure the patient's safety. The patient's presenting symptoms, physical exam findings, and initial radiographic and laboratory data in the context of their chronic comorbidities is felt to place them at high risk for further clinical deterioration. Furthermore, it is not anticipated that the patient will be medically stable for discharge from the hospital within 2 midnights of admission.   * I certify that at the point of admission it is my clinical judgment that the patient will require inpatient hospital care spanning beyond 2 midnights from the point of admission due to high intensity of service, high risk for further deterioration and high frequency of surveillance required.*  Author: Verdene Lennert, MD 03/02/2023 2:03 PM  For on call review [email or web address].

## 2023-03-02 NOTE — Assessment & Plan Note (Signed)
Patient presenting with new onset atrial fibrillation with RVR.  Rates have improved with one-time dose of diltiazem.  Will avoid further rate controlling agents given new onset heart failure with unknown EF.  I suspect rates will improve with diuresis.  CHA2DS2-VASc of 2 for hypertension and heart failure.  - Telemetry monitoring - Metoprolol IV push for sustained rates above 115 - Start Eliquis 5 mg twice daily

## 2023-03-02 NOTE — ED Triage Notes (Signed)
Pt to ED for shob and generalized abd pain for 6 weeks. Sent by PCP. Reports feeling bloating. Denies n/v/d Reports worsening shob when lying

## 2023-03-02 NOTE — Telephone Encounter (Signed)
  Chief Complaint: SOB Symptoms: bloated feeling in stomach - O2 sat reading , 90%, Worsening SOB- unable lie supine - waking up gasping  Frequency: 6 weeks  Pertinent Negatives: Patient denies chest pain or any pain to left arm, neck, shoulder or upper back  Disposition: [x] ED /[] Urgent Care (no appt availability in office) / [] Appointment(In office/virtual)/ []  [facility]/ [] Home Care/ [] Refused Recommended Disposition /[] [place]/ []  Follow-up with PCP Additional Notes: pt will have wife to drive him to ED Reason for Disposition  Oxygen level (e.g., pulse oximetry) 90 percent or lower  Answer Assessment - Initial Assessment Questions 1. RESPIRATORY STATUS: "Describe your breathing?" (e.g., wheezing, shortness of breath, unable to speak, severe coughing)      SOB  2. ONSET: "When did this breathing problem begin?"      6 weeks  3. PATTERN "Does the difficult breathing come and go, or has it been constant since it started?"      Constant  4. SEVERITY: "How bad is your breathing?" (e.g., mild, moderate, severe)    - MILD: No SOB at rest, mild SOB with walking, speaks normally in sentences, can lie down, no retractions, pulse < 100.    - MODERATE: SOB at rest, SOB with minimal exertion and prefers to sit, cannot lie down flat, speaks in phrases, mild retractions, audible wheezing, pulse 100-120.    - SEVERE: Very SOB at rest, speaks in single words, struggling to breathe, sitting hunched forward, retractions, pulse > 120      Mild - can't sleep laying down  5. RECURRENT SYMPTOM: "Have you had difficulty breathing before?" If Yes, ask: "When was the last time?" and "What happened that time?"      *No Answer* 6. CARDIAC HISTORY: "Do you have any history of heart disease?" (e.g., heart attack, angina, bypass surgery, angioplasty)      *No Answer* 7. LUNG HISTORY: "Do you have any history of lung disease?"  (e.g., pulmonary embolus, asthma, emphysema)     no 8.  CAUSE: "What do you think is causing the breathing problem?"      *No Answer* 9. OTHER SYMPTOMS: "Do you have any other symptoms? (e.g., dizziness, runny nose, cough, chest pain, fever)     Pain located to stomach pressure feels like loaded  10. O2 SATURATION MONITOR:  "Do you use an oxygen saturation monitor (pulse oximeter) at home?" If Yes, ask: "What is your reading (oxygen level) today?" "What is your usual oxygen saturation reading?" (e.g., 95%)       141 O2 82%  145:87%  Protocols used: Breathing Difficulty-A-AH

## 2023-03-02 NOTE — Assessment & Plan Note (Signed)
Patient presenting with several week history of dyspnea on exertion, lower extremity swelling, orthopnea and abdominal bloating.  BNP elevated at 680 consistent with new diagnosis of acute heart failure.  Likely due to long-term uncontrolled hypertension.  - Telemetry monitoring - Echocardiogram ordered - Continue Lasix 40 mg IV daily with goal diuresis of 2-3 L per 24-hour. - Daily weights - Strict in and out - Daily monitoring of potassium magnesium

## 2023-03-02 NOTE — ED Provider Notes (Addendum)
[facility] Provider Note    Event Date/Time   First MD Initiated Contact with Patient 03/02/23 1035     (approximate)   History   Shortness of Breath and Abdominal Pain   HPI  Stephen [name] is a 62 y.o. male  here with SOB, abdominal fullness. Pt reports 4-5 weeks of progressive abdominal fullness and early satiety. He has also noted SOB, worse with lying flat. Over the last week, he's had increasing LE edema, SOB with exertion and lying flat, and ongoing abdominal distension. He feels full "all the time" with very early satiety. He has no known h/o CHF. States he has had HTN since he was an adolescent. No unilateral leg pain or swelling. No chest pain.       Physical Exam   Triage Vital Signs: ED Triage Vitals  Enc Vitals Group     BP 03/02/23 1000 (!) 120/96     Pulse Rate 03/02/23 1003 (!) 127     Resp 03/02/23 1000 (!) 22     Temp 03/02/23 1000 97.6 F (36.4 C)     Temp src --      SpO2 03/02/23 1003 98 %     Weight 03/02/23 1001 250 lb (113.4 kg)     Height 03/02/23 1001 6\' 2"  (1.88 m)     Head Circumference --      Peak Flow --      Pain Score 03/02/23 1000 10     Pain Loc --      Pain Edu? --      Excl. in GC? --     Most recent vital signs: Vitals:   03/02/23 1846 03/02/23 1857  BP:    Pulse: (!) 105 (!) 131  Resp: (!) 23 (!) 25  Temp:    SpO2:  94%     General: Awake, no distress.  CV:  Good peripheral perfusion. Irregularly irregular rhythm. Resp:  Normal work of breathing. Mild basilar rales noted. Abd:  No distention. No tenderness. Other:  1+ pitting edema bl LE   ED Results / Procedures / Treatments   Labs (all labs ordered are listed, but only abnormal results are displayed) Labs Reviewed  COMPREHENSIVE METABOLIC PANEL - Abnormal; Notable for the following components:      Result Value   Glucose, Bld 107 (*)    Calcium 8.7 (*)    ALT 46 (*)    All other components within normal limits  CBC - Abnormal;  Notable for the following components:   Platelets 148 (*)    All other components within normal limits  URINALYSIS, ROUTINE W REFLEX MICROSCOPIC - Abnormal; Notable for the following components:   Color, Urine STRAW (*)    APPearance CLEAR (*)    All other components within normal limits  BRAIN NATRIURETIC PEPTIDE - Abnormal; Notable for the following components:   B Natriuretic Peptide 680.0 (*)    All other components within normal limits  PROTIME-INR - Abnormal; Notable for the following components:   Prothrombin Time 16.4 (*)    INR 1.3 (*)    All other components within normal limits  LIPASE, BLOOD  TSH  HIV ANTIBODY (ROUTINE TESTING W REFLEX)  CBC  PROTIME-INR  COMPREHENSIVE METABOLIC PANEL  TROPONIN I (HIGH SENSITIVITY)     EKG Atrial fibrillation, VR 127. QRS 96, QTc 465. No acute ST elevations or depressions. Non specific TW changes, likely rate-related.   RADIOLOGY CXR: Small b/l pleural effusions, cardiomegaly  I also independently reviewed and agree with radiologist interpretations.   PROCEDURES:  Critical Care performed: Yes, see critical care procedure note(s)  .Critical Care  Performed by: Shaune Pollack, MD Authorized by: Shaune Pollack, MD   Critical care provider statement:    Critical care time (minutes):  30   Critical care was necessary to treat or prevent imminent or life-threatening deterioration of the following conditions:  Cardiac failure, circulatory failure and respiratory failure   Critical care was time spent personally by me on the following activities:  Development of treatment plan with patient or surrogate, discussions with consultants, evaluation of patient's response to treatment, examination of patient, ordering and review of laboratory studies, ordering and review of radiographic studies, ordering and performing treatments and interventions, pulse oximetry, re-evaluation of patient's condition and review of old  charts     MEDICATIONS ORDERED IN ED: Medications  apixaban (ELIQUIS) tablet 5 mg (has no administration in time range)  furosemide (LASIX) injection 40 mg (has no administration in time range)  sodium chloride flush (NS) 0.9 % injection 3 mL (3 mLs Intravenous Given 03/02/23 1726)  acetaminophen (TYLENOL) tablet 650 mg (has no administration in time range)    Or  acetaminophen (TYLENOL) suppository 650 mg (has no administration in time range)  ondansetron (ZOFRAN) tablet 4 mg (has no administration in time range)    Or  ondansetron (ZOFRAN) injection 4 mg (has no administration in time range)  polyethylene glycol (MIRALAX / GLYCOLAX) packet 17 g (has no administration in time range)  folic acid (FOLVITE) tablet 1 mg (1 mg Oral Given 03/02/23 1514)  thiamine (VITAMIN B1) tablet 100 mg (100 mg Oral Given 03/02/23 1514)  aspirin EC tablet 81 mg (81 mg Oral Given 03/02/23 1721)  clonazePAM (KLONOPIN) tablet 0.5 mg (has no administration in time range)  gabapentin (NEURONTIN) capsule 600 mg (has no administration in time range)  amiodarone (NEXTERONE) 1.8 mg/mL load via infusion 150 mg (has no administration in time range)  amiodarone (NEXTERONE PREMIX) 360-4.14 MG/200ML-% (1.8 mg/mL) IV infusion (has no administration in time range)    Followed by  amiodarone (NEXTERONE PREMIX) 360-4.14 MG/200ML-% (1.8 mg/mL) IV infusion (has no administration in time range)  diltiazem (CARDIZEM) injection 10 mg (10 mg Intravenous Given 03/02/23 1155)  furosemide (LASIX) injection 40 mg (40 mg Intravenous Given 03/02/23 1156)  metoprolol tartrate (LOPRESSOR) injection 2.5 mg (2.5 mg Intravenous Given 03/02/23 1721)     IMPRESSION / MDM / ASSESSMENT AND PLAN / ED COURSE  I reviewed the triage vital signs and the nursing notes.                              Differential diagnosis includes, but is not limited to, new onset CHF, AFib RVR, HTN CM, anemia, PNA, renal or liver failure  Patient's presentation is most  consistent with acute presentation with potential threat to life or bodily function.  The patient is on the cardiac monitor to evaluate for evidence of arrhythmia and/or significant heart rate changes  62 yo M here with early satiety, SOB, and leg swelling. Clinically, pt appears to have CHF, versus cirrhosis. No known h/o CHF. EKG nonischemic and sx do not seem ischemic. Pt also has new onset afib rvr, improved with diltiazem IV.  Labs o/w largely unremarkable. Electrolytes largely wnl. Cbc without leukocytosis or anemia. BNP >600 c/w CHF. TSH normal. CXR shows small b/l effusions.  Will admit for diuresis, management of  new onset AFib RVR and anasarca. CHADS2VASC is a 1 - will defer to primary tem re: anticoagulation.   FINAL CLINICAL IMPRESSION(S) / ED DIAGNOSES   Final diagnoses:  New onset atrial fibrillation (HCC)  Acute congestive heart failure, unspecified heart failure type (HCC)     Rx / DC Orders   ED Discharge Orders     None        Note:  This document was prepared using Dragon voice recognition software and may include unintentional dictation errors.   Shaune Pollack, MD 03/02/23 Babette Relic    Shaune Pollack, MD 03/02/23 2000

## 2023-03-02 NOTE — Assessment & Plan Note (Signed)
Last lipid panel with LDL of 147.  - Pending hepatic workup, will need to start high intensity statin

## 2023-03-02 NOTE — Assessment & Plan Note (Signed)
Patient reports abdominal distention was one of his first symptoms, and given mildly elevated ALT and mildly low platelets, will evaluate additional liver functions with PT/INR and and obtain a right upper quadrant ultrasound with evaluation for ascites as well.   - Right upper quadrant ultrasound - PT/INR

## 2023-03-02 NOTE — TOC Benefit Eligibility Note (Signed)
Pharmacy Patient Advocate Encounter  Insurance verification completed.    The patient is insured through  [employer] [place] [employer]    Ran test claim for Eliquis 5 mg and the current 30 day co-pay is $4.00.   This test claim was processed through [facility]- copay amounts may vary at other pharmacies due to pharmacy/plan contracts, or as the patient moves through the different stages of their insurance plan.    Roland Earl, CPHT Pharmacy Patient Advocate Specialist [facility] Patient Advocate Team Direct Number: [phone]  Fax: [phone]

## 2023-03-02 NOTE — Assessment & Plan Note (Signed)
" >>  ASSESSMENT AND PLAN FOR NEW ONSET ATRIAL FIBRILLATION (HCC) WRITTEN ON 03/02/2023  1:22 PM BY ARNETT SAUNDERS, MD  Patient presenting with new onset atrial fibrillation with RVR.  Rates have improved with one-time dose of diltiazem .  Will avoid further rate controlling agents given new onset heart failure with unknown EF.  I suspect rates will improve with diuresis.  CHA2DS2-VASc of 2 for hypertension and heart failure.  - Telemetry monitoring - Metoprolol  IV push for sustained rates above 115 - Start Eliquis  5 mg twice daily "

## 2023-03-03 ENCOUNTER — Inpatient Hospital Stay (HOSPITAL_COMMUNITY)
Admit: 2023-03-03 | Discharge: 2023-03-03 | Disposition: A | Discharge status: Home / Self Care | Payer: Medicaid Other | Attending: Internal Medicine | Admitting: Internal Medicine

## 2023-03-03 ENCOUNTER — Inpatient Hospital Stay
Admit: 2023-03-03 | Discharge: 2023-03-03 | Disposition: A | Discharge status: Home / Self Care | Payer: Medicaid Other | Attending: Internal Medicine | Admitting: Internal Medicine

## 2023-03-03 DIAGNOSIS — I509 Heart failure, unspecified: Secondary | ICD-10-CM

## 2023-03-03 DIAGNOSIS — E785 Hyperlipidemia, unspecified: Secondary | ICD-10-CM

## 2023-03-03 DIAGNOSIS — E876 Hypokalemia: Secondary | ICD-10-CM | POA: Diagnosis not present

## 2023-03-03 DIAGNOSIS — I5021 Acute systolic (congestive) heart failure: Secondary | ICD-10-CM

## 2023-03-03 DIAGNOSIS — I10 Essential (primary) hypertension: Secondary | ICD-10-CM | POA: Diagnosis not present

## 2023-03-03 DIAGNOSIS — I4891 Unspecified atrial fibrillation: Secondary | ICD-10-CM | POA: Diagnosis not present

## 2023-03-03 DIAGNOSIS — F1010 Alcohol abuse, uncomplicated: Secondary | ICD-10-CM

## 2023-03-03 LAB — COMPREHENSIVE METABOLIC PANEL
ALT: 36 U/L (ref 0–44)
AST: 17 U/L (ref 15–41)
Albumin: 3.5 g/dL (ref 3.5–5.0)
Alkaline Phosphatase: 76 U/L (ref 38–126)
Anion gap: 9 (ref 5–15)
BUN: 17 mg/dL (ref 6–20)
CO2: 24 mmol/L (ref 22–32)
Calcium: 8.3 mg/dL — ABNORMAL LOW (ref 8.9–10.3)
Chloride: 104 mmol/L (ref 98–111)
Creatinine, Ser: 0.84 mg/dL (ref 0.61–1.24)
GFR, Estimated: >60 mL/min (ref >60)
Glucose, Bld: 105 mg/dL — ABNORMAL HIGH (ref 70–99)
Potassium: 3.3 mmol/L — ABNORMAL LOW (ref 3.5–5.1)
Sodium: 137 mmol/L (ref 135–145)
Total Bilirubin: 0.9 mg/dL (ref 0.3–1.2)
Total Protein: 6.2 g/dL — ABNORMAL LOW (ref 6.5–8.1)

## 2023-03-03 LAB — ECHOCARDIOGRAM COMPLETE
AR max vel: 2.11 cm2
AV Area VTI: 2.31 cm2
AV Area mean vel: 2.05 cm2
AV Mean grad: 2 mmHg
AV Peak grad: 3.7 mmHg
Ao pk vel: 0.96 m/s
Area-P 1/2: 9.48 cm2
Calc EF: 7.2 %
Height: 74 in
S' Lateral: 5.4 cm
Single Plane A2C EF: 8.7 %
Single Plane A4C EF: 6.9 %
Weight: 4059.99 oz

## 2023-03-03 LAB — CBC
HCT: 42 % (ref 39.0–52.0)
Hemoglobin: 13.7 g/dL (ref 13.0–17.0)
MCH: 27.5 pg (ref 26.0–34.0)
MCHC: 32.6 g/dL (ref 30.0–36.0)
MCV: 84.2 fL (ref 80.0–100.0)
Platelets: 142 10*3/uL — ABNORMAL LOW (ref 150–400)
RBC: 4.99 MIL/uL (ref 4.22–5.81)
RDW: 12.9 % (ref 11.5–15.5)
WBC: 5.5 10*3/uL (ref 4.0–10.5)
nRBC: 0 % (ref 0.0–0.2)

## 2023-03-03 LAB — PROTIME-INR
INR: 1.4 — ABNORMAL HIGH (ref 0.8–1.2)
Prothrombin Time: 17.2 seconds — ABNORMAL HIGH (ref 11.4–15.2)

## 2023-03-03 MED ORDER — POTASSIUM CHLORIDE CRYS ER 20 MEQ PO TBCR
40.0000 meq | EXTENDED_RELEASE_TABLET | Freq: Once | ORAL | Status: AC
  Administered 2023-03-03: 40 meq via ORAL
  Filled 2023-03-03: qty 2

## 2023-03-03 MED ORDER — FUROSEMIDE 10 MG/ML IJ SOLN
40.0000 mg | Freq: Two times a day (BID) | INTRAMUSCULAR | Status: DC
  Administered 2023-03-03 – 2023-03-07 (×8): 40 mg via INTRAVENOUS
  Filled 2023-03-03 (×8): qty 4

## 2023-03-03 MED ORDER — METOPROLOL SUCCINATE ER 25 MG PO TB24
12.5000 mg | ORAL_TABLET | Freq: Every day | ORAL | Status: DC
  Administered 2023-03-03 – 2023-03-04 (×2): 12.5 mg via ORAL
  Filled 2023-03-03 (×2): qty 1

## 2023-03-03 MED ORDER — DIGOXIN 125 MCG PO TABS
0.1250 mg | ORAL_TABLET | Freq: Every day | ORAL | Status: DC
  Administered 2023-03-05 – 2023-03-07 (×3): 0.125 mg via ORAL
  Filled 2023-03-03 (×3): qty 1

## 2023-03-03 MED ORDER — PERFLUTREN LIPID MICROSPHERE
1.0000 mL | INTRAVENOUS | Status: AC | PRN
  Administered 2023-03-03: 2 mL via INTRAVENOUS

## 2023-03-03 MED ORDER — DIGOXIN 250 MCG PO TABS
0.2500 mg | ORAL_TABLET | Freq: Four times a day (QID) | ORAL | Status: AC
  Administered 2023-03-03 – 2023-03-04 (×2): 0.25 mg via ORAL
  Filled 2023-03-03 (×2): qty 1

## 2023-03-03 NOTE — Progress Notes (Signed)
*  PRELIMINARY RESULTS* Echocardiogram 2D Echocardiogram has been performed.  Cristela Blue 03/03/2023, 10:01 AM

## 2023-03-03 NOTE — Progress Notes (Signed)
*  PRELIMINARY RESULTS* Echocardiogram 2D Echocardiogram has been performed.  Stephen [name] 03/03/2023, 10:06 AM

## 2023-03-03 NOTE — Progress Notes (Addendum)
Progress Note   Patient: Stephen [name]:[id number] DOB: 06-13-1961 DOA: 03/02/2023     1 DOS: the patient was seen and examined on 03/03/2023    Subjective:  Patient seen and examined in the presence of the wife Admits to improvement in respiratory function compared to upon presentation Denies nausea vomiting abdominal pain chest pain or cough  Brief hospital course: From HPI  "LAMART ANNIS is a 62 y.o. male with medical history significant of hypertension, hyperlipidemia, chronic back pain, anxiety, who presents to the ED due to shortness of breath and abdominal pain.   Mr. Kush states that for the last 6 weeks, he has been experiencing generalized abdominal pain that feels like he "just ate at a buffet" with early satiety.  Then he noticed that his abdomen was getting more distended and he was having to go further out on his belt buckle.  Over the last several weeks, he has been experiencing significant shortness of breath at night when trying to lay flat that he describes as suffocating.  When he would wake up during these moments, his wife, who is an Charity fundraiser, will check his oxygen and heart rate.  He states that his heart rate would be very high and his oxygen would be in the mid 80s but would recover.  Over the last couple days, he has noticed quickly progressing lower extremity swelling.  He called his PCP today who told him to go to the ED.  He denies any chest pain, palpitations, nausea, vomiting, fever, chills, recent illness.   Mr. Cost states that he was a heavy drinker with at least a case of beer per day for approximately 15 years.  He now only has a maximum of 2 beers per day and has been doing so for 2 years.  He denies any history of withdrawals.   ED course: On arrival to the ED, patient was normotensive at 120/96 with heart rate of 134.  He was saturating at 98% on room air.  He was afebrile at 97.6.  Initial workup demonstrated CBC with platelets of 148, and CMP with  potassium 3.8, bicarb 22, glucose 107, creatinine 0.85, ALT 46 and GFR above 60.  BNP elevated 680.EKG demonstrated atrial fibrillation with RVR.  Patient given one-time dose of diltiazem with improvement in rates.  Chest x-ray obtained demonstrating bilateral small pleural effusions.  Patient started on Lasix and [facility] contacted for admission."    Assessment and Plan:   Acute heart failure [facility]) Patient presenting with several week history of dyspnea on exertion, lower extremity swelling, orthopnea and abdominal bloating.  BNP elevated at 680 consistent with new diagnosis of acute heart failure.  Likely due to long-term uncontrolled hypertension. I personally reviewed patient's chest x-ray that shows minimal pleural effusion in the setting of cardiomegaly - Continue telemetry monitoring Plan of care discussed with nursing staff - Follow-up on echocardiogram report - Continue Lasix 40 mg IV daily with goal diuresis of 2-3 L per 24-hour. - Continue to monitor Daily weights Continue to monitor input and output   Atrial fibrillation with RVR (HCC) Patient presenting with new onset atrial fibrillation with RVR CHA2DS2-VASc of 2 for hypertension and heart failure. Cardiologist on board.  Appreciate input - Telemetry monitoring Continue amiodarone drip - Metoprolol IV push for sustained rates above 115 - Continue Eliquis 5 mg twice daily   Elevated LFTs Patient reports abdominal distention was one of his first symptoms, and given mildly elevated ALT and mildly low platelets, will  evaluate additional liver functions with PT/INR and and obtain a right upper quadrant ultrasound with evaluation for ascites as well.  Report of abdominal ultrasound reviewed which showed no gallstones or ductal dilatation Continue to monitor LFTs   Primary hypertension Blood pressure within goal at this time. Continue to hold home antihypertensives, restart if needed   HLD (hyperlipidemia) Last lipid panel with  LDL of 147. We will consider statin therapy   Advance Care Planning:   Code Status: Full Code verified by patient   Consults: None   Family Communication: No family at bedside, patient states he will update his wife     Physical Exam:   HENT:     Head: Normocephalic and atraumatic.     Mouth/Throat:     Mouth: Mucous membranes are moist.     Pharynx: Oropharynx is clear.  Cardiovascular: Tachycardic with irregular rate    No gallop.  Pulmonary: Bilateral crackles Abdominal: Nondistended nontender Musculoskeletal: Bilateral lower extremity edema Skin:    General: Skin is warm and dry.     Findings: No ecchymosis or rash.  Neurological:     General: No focal deficit present.     Mental Status: He is alert and oriented to person, place, and time.  Psychiatric:        Mood and Affect: Mood normal.        Behavior: Behavior normal.  Vitals:   03/03/23 1231 03/03/23 1233 03/03/23 1500 03/03/23 1651  BP: (!) 124/99   (!) 119/109  Pulse: (!) 121 92  94  Resp: 18 20  (!) 23  Temp: (!) 97.5 F (36.4 C)   98.4 F (36.9 C)  TempSrc: Oral   Oral  SpO2: 98% 98% 96% 95%  Weight:      Height:        Data Reviewed: I have reviewed patient's laboratory data including ultrasound, chest x-ray as well as CBC and CMP  Author: Loyce Dys, MD 03/03/2023 5:54 PM  For on call review [email or web address].

## 2023-03-03 NOTE — Plan of Care (Signed)
  Problem: Pain Managment: Goal: General experience of comfort will improve Outcome: Progressing   Problem: Safety: Goal: Ability to remain free from injury will improve Outcome: Progressing   Problem: Skin Integrity: Goal: Risk for impaired skin integrity will decrease Outcome: Progressing   

## 2023-03-03 NOTE — Consult Note (Signed)
Cardiology Consultation   Patient ID: Stephen [name] MRN: [id number]; DOB: 1961/08/30  Admit date: 03/02/2023 Date of Consult: 03/03/2023  PCP:  Malva Limes, MD   [facility] Providers Cardiologist:  None      New consult completed by Dr End  Patient Profile:   Stephen [name] is a 62 y.o. male with a hx of hypertension, hyperlipidemia, chronic back pain, anxiety, who is being seen 03/03/2023 for the evaluation of atrial fibrillation RVR at the request of Dr Huel Cote.  History of Present Illness:   [name] presented to the [facility] emergency department 03/02/2023 for shortness of breath and generalized abdominal pain that have been ongoing for approximately 6 weeks.  He had called his PCP office with his symptoms after returning from a short vacation at the beach and was advised to report to the emergency department.  He stated he had progressive abdominal fullness and early satiety that he over the past 4 to 5 weeks.  He had noticed dyspnea and fatigue but got concerned when he started suffering from two-pillow orthopnea and PND.  He stated that his abdomen had originally just felt bloated prior to swelling to his bilateral lower extremities.  He states that he does have a family history in his mother and grandmother of heart failure.  He also used to be a heavy drinker and denies any continuation of this and only drinks on occasion but smaller amounts.  He denies any chest pain, palpitations, lightheadedness/dizziness or associated symptoms.  Initial vitals: Blood pressure 120/96, pulse of 127, respirations of 22, temperature 97.6  Pertinent labs: Blood glucose of 107, calcium 8.7, ALT 46, platelets of 148, BNP of 680, INR of 1.3  Imaging: Chest x-ray reveals cardiomegaly and small bilateral pleural effusions  Medications administered in the emergency department metoprolol tartrate 2.5 mg IVP, furosemide 40 mg IVP, diltiazem 10 mg IVP, amiodarone bolus and infusion, aspirin  81 mg  Cardiology was consulted for atrial fibrillation RVR   Past Medical History:  Diagnosis Date   Hyperlipidemia    Hypertension     Past Surgical History:  Procedure Laterality Date   COLONOSCOPY  10/31/2013   TONSILLECTOMY AND ADENOIDECTOMY       Home Medications:  Prior to Admission medications   Medication Sig Start Date End Date Taking? Authorizing Provider  amLODipine (NORVASC) 5 MG tablet Take 5 mg by mouth daily. 01/22/23  Yes [provider]  aspirin 81 MG tablet Take 1 tablet by mouth daily.   Yes [provider]  bisoprolol-hydrochlorothiazide (ZIAC) 10-6.25 MG tablet TAKE ONE TABLET BY MOUTH DAILY. 02/02/23  Yes Malva Limes, MD  clonazePAM (KLONOPIN) 0.5 MG tablet TAKE 0.5-1 TABLETS (0.25-0.5 MG TOTAL) BY MOUTH TWO (TWO) TIMES DAILY AS NEEDED FOR ANXIETY. 01/22/23  Yes Malva Limes, MD  gabapentin (NEURONTIN) 600 MG tablet Take 1 tablet (600 mg total) by mouth 3 (three) times daily as needed. 12/26/22  Yes Malva Limes, MD  niacin 500 MG tablet Take 1 tablet by mouth daily. 04/09/11  Yes [provider]  OMEGA-3 FATTY ACIDS PO Take 1 capsule by mouth daily. 04/09/11  Yes [provider]  amLODipine (NORVASC) 10 MG tablet Take 1 tablet (10 mg total) by mouth daily. Patient not taking: Reported on 03/02/2023 12/26/22   Malva Limes, MD    Inpatient Medications: Scheduled Meds:  apixaban  5 mg Oral BID   aspirin EC  81 mg Oral Daily   folic acid  1 mg Oral Daily   furosemide  40 mg Intravenous Daily   metoprolol succinate  12.5 mg Oral Daily   sodium chloride flush  3 mL Intravenous Q12H   thiamine  100 mg Oral Daily   Continuous Infusions:  amiodarone 30 mg/hr (03/03/23 1220)   PRN Meds: acetaminophen **OR** acetaminophen, clonazePAM, gabapentin, ondansetron **OR** ondansetron (ZOFRAN) IV, polyethylene glycol  Allergies:   No Known Allergies  Social History:   Social History   Socioeconomic History   Marital  status: Married    Spouse name: Not on file   Number of children: Not on file   Years of education: 12   Highest education level: Not on file  Occupational History   Not on file  Tobacco Use   Smoking status: Never   Smokeless tobacco: Never  Substance and Sexual Activity   Alcohol use: Yes    Alcohol/week: 120.0 standard drinks of alcohol    Types: 84 Cans of beer, 36 Shots of liquor per week    Comment: 12 cans of beer and 3 shots of liquor daily    Drug use: No   Sexual activity: Not on file  Other Topics Concern   Not on file  Social History Narrative   Not on file   Social Determinants of Health   Financial Resource Strain: Not on file  Food Insecurity: No Food Insecurity (03/03/2023)   Hunger Vital Sign    Worried About Running Out of Food in the Last Year: Never true    Ran Out of Food in the Last Year: Never true  Transportation Needs: No Transportation Needs (03/03/2023)   PRAPARE - Administrator, Civil Service (Medical): No    Lack of Transportation (Non-Medical): No  Physical Activity: Not on file  Stress: Not on file  Social Connections: Not on file  Intimate Partner Violence: Not At Risk (03/03/2023)   Humiliation, Afraid, Rape, and Kick questionnaire    Fear of Current or Ex-Partner: No    Emotionally Abused: No    Physically Abused: No    Sexually Abused: No    Family History:    Family History  Problem Relation Age of Onset   Congestive Heart Failure Mother    Hypertension Mother    COPD Father    Heart failure Father    HIV Brother      ROS:  Please see the history of present illness.  Review of Systems  Constitutional:  Positive for malaise/fatigue.  Respiratory:  Positive for shortness of breath.   Cardiovascular:  Positive for orthopnea, leg swelling and PND.  Gastrointestinal:  Positive for abdominal pain.    All other ROS reviewed and negative.     Physical Exam/Data:   Vitals:   03/03/23 1228 03/03/23 1231 03/03/23 1233  03/03/23 1500  BP:  (!) 124/99    Pulse:  (!) 121 92   Resp:  18 20   Temp:  (!) 97.5 F (36.4 C)    TempSrc:  Oral    SpO2: 97% 98% 98% 96%  Weight:      Height:        Intake/Output Summary (Last 24 hours) at 03/03/2023 1558 Last data filed at 03/03/2023 1221 Gross per 24 hour  Intake 861.23 ml  Output 2500 ml  Net -1638.77 ml      03/03/2023   12:35 AM 03/02/2023   10:01 AM 12/26/2022    9:56 AM  Last 3 Weights  Weight (lbs) 253 lb 12  oz 250 lb 247 lb  Weight (kg) 115.1 kg 113.399 kg 112.038 kg     Body mass index is 32.58 kg/m.  General:  Well nourished, well developed, in no acute distress HEENT: normal Neck: no JVD Vascular: No carotid bruits; Distal pulses 2+ bilaterally Cardiac:  normal S1, S2; IR IR; tachycardic no murmur  Lungs:  clear upper lobes with crackles in the bilateral bases to auscultation bilaterally, respirations are unlabored at rest on 2 L of O2 via nasal cannula Abd: soft, mild distention, nontender, no hepatomegaly  Ext: 2+ edema bilateral lower extremities Musculoskeletal:  No deformities, BUE and BLE strength normal and equal Skin: warm and dry  Neuro:  CNs 2-12 intact, no focal abnormalities noted Psych:  Normal affect   EKG:  The EKG was personally reviewed and demonstrates: Atrial fibrillation RVR with LVH and a right axis deviation Telemetry:  Telemetry was personally reviewed and demonstrates: Rhythm with rates of 100-110  Relevant CV Studies: Echocardiogram ordered and pending  Laboratory Data:  High Sensitivity Troponin:   Recent Labs  Lab 03/02/23 1542  TROPONINIHS 7     Chemistry Recent Labs  Lab 03/02/23 1008 03/03/23 0510  NA 138 137  K 3.8 3.3*  CL 108 104  CO2 22 24  GLUCOSE 107* 105*  BUN 20 17  CREATININE 0.85 0.84  CALCIUM 8.7* 8.3*  GFRNONAA >60 >60  ANIONGAP 8 9    Recent Labs  Lab 03/02/23 1008 03/03/23 0510  PROT 6.7 6.2*  ALBUMIN 3.8 3.5  AST 24 17  ALT 46* 36  ALKPHOS 79 76  BILITOT 1.1 0.9    Lipids No results for input(s): "CHOL", "TRIG", "HDL", "LABVLDL", "LDLCALC", "CHOLHDL" in the last 168 hours.  Hematology Recent Labs  Lab 03/02/23 1008 03/03/23 0510  WBC 6.2 5.5  RBC 5.21 4.99  HGB 14.3 13.7  HCT 44.4 42.0  MCV 85.2 84.2  MCH 27.4 27.5  MCHC 32.2 32.6  RDW 13.0 12.9  PLT 148* 142*   Thyroid  Recent Labs  Lab 03/02/23 1542  TSH 2.280    BNP Recent Labs  Lab 03/02/23 1008  BNP 680.0*    DDimer No results for input(s): "DDIMER" in the last 168 hours.   Radiology/Studies:  US Abdomen Limited RUQ (LIVER/GB)  Result Date: 03/02/2023 CLINICAL DATA:  Elevated liver function tests EXAM: ULTRASOUND ABDOMEN LIMITED RIGHT UPPER QUADRANT COMPARISON:  None Available. FINDINGS: Gallbladder: Gallbladder is distended. Slight wall thickening of 4 mm but no shadowing stones. Common bile duct: Diameter: 3 mm Liver: No focal lesion identified. Within normal limits in parenchymal echogenicity. Portal vein is patent on color Doppler imaging with normal direction of blood flow towards the liver. Other: Right-sided pleural effusion.  Trace ascites. IMPRESSION: No gallstones or ductal dilatation. However the gallbladder wall is slightly thickened, nonspecific. There is some ascites and right pleural effusion. Please correlate with history and further workup when appropriate Electronically Signed   By: Karen Kays M.D.   On: 03/02/2023 14:47   DG Chest 2 View  Result Date: 03/02/2023 CLINICAL DATA:  Shortness of breath EXAM: CHEST - 2 VIEW COMPARISON:  None Available. FINDINGS: Tiny pleural effusions. Mild linear opacity in the midlung zone on the left and at both lung bases likely scar or atelectasis. No pneumothorax or edema. Enlarged cardiopericardial silhouette. Degenerative changes of the spine IMPRESSION: Tiny pleural effusions. Enlarged heart with some mild scar or atelectasis. Electronically Signed   By: Karen Kays M.D.   On: 03/02/2023 10:32  Assessment and Plan:    Acute heart failure -Progressive dyspnea, orthopnea, PND, and abdominal bloating for 4 to 6 weeks -BNP 680 - -1.6 L output in the last 24 hours -Echocardiogram ordered and pending with further recommendations to follow -Continue on Lasix 40 mg IV daily -Daily BMP while on diuretic therapy -Daily weights, I's and O's, low-sodium diet  New onset atrial fibrillation with RVR -Remains in atrial fibrillation with rates of 100-110 -Currently on amiodarone infusion -TSH 2.280 -Continued on apixaban 5 mg twice daily for CHA2DS2-VASc score of at least 2 for stroke prophylaxis -Started on Toprol XL 12.5 mg daily -If continues to be difficult to rate control can consider TEE/DCCV -If rates are able to be controlled continue on anticoagulation uninterrupted for minimum of 3 to 4 weeks with outpatient cardioversion scheduled -Continue with telemetry monitoring  Mild hypokalemia -Potassium supplements ordered -Daily BMP -Maintain potassium level greater than 4 less than 5 -Monitor/trend/replete electrolytes as needed  Primary hypertension -Blood pressure 120/101 -Continue amiodarone and Toprol -PTA medications of amlodipine and Ziac remain on hold -Can restart home medications as needed -Vital signs per unit protocol  Hyperlipidemia -LDL 147 -Previously was taking niacin and omega-3 -Would benefit from statin therapy, previous documentation of elevated LFTs, normal lipase, no changes noted to the liver on abdominal ultrasound  Alcohol abuse -Previous 15-year history of heavy drinking -Has decreased to minimal drinking daily -Total cessation is recommended   Risk Assessment/Risk Scores:        New York Heart Association (NYHA) Functional Class NYHA Class III  CHA2DS2-VASc Score = 2   This indicates a 2.2% annual risk of stroke. The patient's score is based upon: CHF History: 1 HTN History: 1 Diabetes History: 0 Stroke History: 0 Vascular Disease History: 0 Age Score:  0 Gender Score: 0         For questions or updates, please contact [facility] Please consult [email or web address] for contact info under    Signed, Ralonda Tartt, NP  03/03/2023 3:58 PM

## 2023-03-03 NOTE — Progress Notes (Addendum)
   03/03/23 1227  Mobility  Activity Ambulated with assistance in room;Transferred from bed to chair  Range of Motion/Exercises Active;All extremities  Level of Assistance Independent after set-up  Assistive Device None  Activity Response Tolerated well   Decreased O2 from 2L to 1L. O2 sats 97-98%. Pt had no c/o dyspnea on exertion during transfer from bed to chair. Pt tolerated well. Callbell within reach.

## 2023-03-04 DIAGNOSIS — I420 Dilated cardiomyopathy: Secondary | ICD-10-CM | POA: Diagnosis not present

## 2023-03-04 DIAGNOSIS — I5023 Acute on chronic systolic (congestive) heart failure: Secondary | ICD-10-CM | POA: Diagnosis not present

## 2023-03-04 DIAGNOSIS — I10 Essential (primary) hypertension: Secondary | ICD-10-CM | POA: Diagnosis not present

## 2023-03-04 DIAGNOSIS — I5020 Unspecified systolic (congestive) heart failure: Secondary | ICD-10-CM | POA: Diagnosis not present

## 2023-03-04 DIAGNOSIS — E782 Mixed hyperlipidemia: Secondary | ICD-10-CM | POA: Diagnosis not present

## 2023-03-04 DIAGNOSIS — I4891 Unspecified atrial fibrillation: Secondary | ICD-10-CM | POA: Diagnosis not present

## 2023-03-04 LAB — BASIC METABOLIC PANEL
Anion gap: 10 (ref 5–15)
BUN: 13 mg/dL (ref 6–20)
CO2: 25 mmol/L (ref 22–32)
Calcium: 8.4 mg/dL — ABNORMAL LOW (ref 8.9–10.3)
Chloride: 103 mmol/L (ref 98–111)
Creatinine, Ser: 0.83 mg/dL (ref 0.61–1.24)
GFR, Estimated: >60 mL/min (ref >60)
Glucose, Bld: 110 mg/dL — ABNORMAL HIGH (ref 70–99)
Potassium: 3.6 mmol/L (ref 3.5–5.1)
Sodium: 138 mmol/L (ref 135–145)

## 2023-03-04 LAB — CBC WITH DIFFERENTIAL/PLATELET
Abs Immature Granulocytes: 0.02 10*3/uL (ref 0.00–0.07)
Basophils Absolute: 0 10*3/uL (ref 0.0–0.1)
Basophils Relative: 1 %
Eosinophils Absolute: 0.2 10*3/uL (ref 0.0–0.5)
Eosinophils Relative: 3 %
HCT: 42.3 % (ref 39.0–52.0)
Hemoglobin: 14.2 g/dL (ref 13.0–17.0)
Immature Granulocytes: 0 %
Lymphocytes Relative: 26 %
Lymphs Abs: 1.5 10*3/uL (ref 0.7–4.0)
MCH: 28.2 pg (ref 26.0–34.0)
MCHC: 33.6 g/dL (ref 30.0–36.0)
MCV: 83.9 fL (ref 80.0–100.0)
Monocytes Absolute: 0.5 10*3/uL (ref 0.1–1.0)
Monocytes Relative: 9 %
Neutro Abs: 3.4 10*3/uL (ref 1.7–7.7)
Neutrophils Relative %: 61 %
Platelets: 162 10*3/uL (ref 150–400)
RBC: 5.04 MIL/uL (ref 4.22–5.81)
RDW: 12.9 % (ref 11.5–15.5)
WBC: 5.6 10*3/uL (ref 4.0–10.5)
nRBC: 0 % (ref 0.0–0.2)

## 2023-03-04 MED ORDER — POTASSIUM CHLORIDE CRYS ER 20 MEQ PO TBCR
40.0000 meq | EXTENDED_RELEASE_TABLET | Freq: Once | ORAL | Status: AC
  Administered 2023-03-04: 40 meq via ORAL
  Filled 2023-03-04: qty 2

## 2023-03-04 MED ORDER — ATORVASTATIN CALCIUM 20 MG PO TABS
40.0000 mg | ORAL_TABLET | Freq: Every day | ORAL | Status: DC
  Administered 2023-03-04 – 2023-03-06 (×3): 40 mg via ORAL
  Filled 2023-03-04 (×3): qty 2

## 2023-03-04 MED ORDER — METOPROLOL SUCCINATE ER 25 MG PO TB24
25.0000 mg | ORAL_TABLET | Freq: Every day | ORAL | Status: DC
  Filled 2023-03-04: qty 1

## 2023-03-04 NOTE — Progress Notes (Signed)
Attending Note Patient seen and examined, agree with detailed note above,   Patient presentation and plan discussed on rounds.    EKG lab work, chest x-ray, echocardiogram reviewed independently by myself  Reports feeling somewhat better this morning, shortness of breath improving Leg swelling improving IV diuresis twice daily Atrial fibrillation, rate 100 bpm up to 110 On Eliquis 5 twice daily, started on digoxin yesterday, amiodarone infusion for rate control -Reports symptoms have been going on for weeks, getting worse  On examination : alert oriented, no JVD, lungs clear to auscultation bilaterally, heart sounds irregularly irregular no murmurs appreciated, abdomen soft nontender no significant lower extremity edema.  Musculoskeletal exam with good range of motion, neurologic exam grossly nonfocal     Latest Ref Rng & Units 03/04/2023    4:27 AM 03/03/2023    5:10 AM 03/02/2023   10:08 AM  BMP  Glucose 70 - 99 mg/dL 644  034  742   BUN 6 - 20 mg/dL 13  17  20    Creatinine 0.61 - 1.24 mg/dL 5.95  6.38  7.56   Sodium 135 - 145 mmol/L 138  137  138   Potassium 3.5 - 5.1 mmol/L 3.6  3.3  3.8   Chloride 98 - 111 mmol/L 103  104  108   CO2 22 - 32 mmol/L 25  24  22    Calcium 8.9 - 10.3 mg/dL 8.4  8.3  8.7       Latest Ref Rng & Units 03/04/2023    4:27 AM 03/03/2023    5:10 AM 03/02/2023   10:08 AM  CBC  WBC 4.0 - 10.5 K/uL 5.6  5.5  6.2   Hemoglobin 13.0 - 17.0 g/dL 43.3  29.5  18.8   Hematocrit 39.0 - 52.0 % 42.3  42.0  44.4   Platelets 150 - 400 K/uL 162  142  148    A/P: 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 Presenting with shortness of breath, orthopnea, PND past 4 to 6 weeks -4.6 L this admission on IV Lasix twice daily EF 25%, global hypokinesis -Continue on Lasix 40 mg IV daily Plan to restore normal sinus rhythm before discharge, details below   New onset atrial fibrillation with RVR -Remains in  atrial fibrillation with rates of 100-110 Continue amiodarone infusion, digoxin metoprolol succinate on apixaban 5 mg twice daily for CHA2DS2-VASc score of at least 2 for stroke prophylaxis --Plan for TEE/DCCV July 5, discussed with specialist, orders placed, waiting to hear back for confirmation\ Increase metoprolol succinate up to 25 daily   Mild hypokalemia -Potassium supplements ordered -Daily BMP on Lasix   Primary hypertension -Continue amiodarone and Toprol increased up to 25 daily -PTA medications of amlodipine and Ziac remain on hold  Hyperlipidemia -LDL 147 -Previously was taking niacin and omega-3  previous documentation of elevated LFTs, normal lipase, no changes noted to the liver on abdominal ultrasound -Will recommend statin therapy   Alcohol abuse -Previous 15-year history of heavy drinking -Has decreased to minimal drinking daily -Total cessation is recommended in the setting of cardiomyopathy   On discussion concerning procedure on Friday TEE cardioversion, risk and benefit discussed Greater than 50% was spent in counseling and coordination of care with patient Total encounter time 50 minutes or more   Signed: Dossie Arbour  M.D., Ph.D. [facility]

## 2023-03-04 NOTE — Progress Notes (Signed)
Progress Note   Patient: Stephen [name] ZOX:[id number] DOB: 03-31-1961 DOA: 03/02/2023     2 DOS: the patient was seen and examined on 03/04/2023      Subjective:  He tells me his respiratory function is better Denies chest pain nausea vomiting abdominal pain   Brief hospital course: From HPI  "Stephen [name] is a 62 y.o. male with medical history significant of hypertension, hyperlipidemia, chronic back pain, anxiety, who presents to the ED due to shortness of breath and abdominal pain.   Stephen [name] states that for the last 6 weeks, he has been experiencing generalized abdominal pain that feels like he "just ate at a buffet" with early satiety.  Then he noticed that his abdomen was getting more distended and he was having to go further out on his belt buckle.  Over the last several weeks, he has been experiencing significant shortness of breath at night when trying to lay flat that he describes as suffocating.  When he would wake up during these moments, his wife, who is an Charity fundraiser, will check his oxygen and heart rate.  He states that his heart rate would be very high and his oxygen would be in the mid 80s but would recover.  Over the last couple days, he has noticed quickly progressing lower extremity swelling.  He called his PCP today who told him to go to the ED.  He denies any chest pain, palpitations, nausea, vomiting, fever, chills, recent illness.   Stephen [name] states that he was a heavy drinker with at least a case of beer per day for approximately 15 years.  He now only has a maximum of 2 beers per day and has been doing so for 2 years.  He denies any history of withdrawals."   ED course: On arrival to the ED, patient was normotensive at 120/96 with heart rate of 134.  He was saturating at 98% on room air.  He was afebrile at 97.6.  Initial workup demonstrated CBC with platelets of 148, and CMP with potassium 3.8, bicarb 22, glucose 107, creatinine 0.85, ALT 46 and GFR above 60.  BNP  elevated 680.EKG demonstrated atrial fibrillation with RVR.  Patient given one-time dose of diltiazem with improvement in rates.  Chest x-ray obtained demonstrating bilateral small pleural effusions.  Patient started on Lasix and TRH contacted for admission."       Assessment and Plan:    Acute heart failure [facility]) Patient presenting with several week history of dyspnea on exertion, lower extremity swelling, orthopnea and abdominal bloating.  BNP elevated at 680 consistent with new diagnosis of acute heart failure.  Likely due to long-term uncontrolled hypertension. I personally reviewed patient's chest x-ray that shows minimal pleural effusion in the setting of cardiomegaly - Continue telemetry monitoring Plan of care discussed with nursing staff Echo report reviewed showing EF 20 to 25% with global hypokinesis We will continue IV Lasix Continue to monitor daily weight Input output monitoring   Acute atrial fibrillation with RVR (HCC) Patient presenting with new onset atrial fibrillation with RVR CHA2DS2-VASc of 2 for hypertension and heart failure. Cardiologist on board.  Appreciate input Continue amiodarone drip as well as Toprol Patient being planned for TEE/DCCV on July 5 by cardiology team Continue to monitor on telemetry closely Continue Eliquis Cardiologist on board and case discussed   Elevated LFTs-improved Patient reports abdominal distention was one of his first symptoms, and given mildly elevated ALT and mildly low platelets, will evaluate additional liver functions  with PT/INR and and obtain a right upper quadrant ultrasound with evaluation for ascites as well.  Abdominal ultrasound did not show any gallstone or any abnormality Monitor LFT   Primary hypertension Monitor blood pressure closely Continue amiodarone and Toprol Continue to hold amlodipine as recommended by cardiology    HLD (hyperlipidemia) Last lipid panel with LDL of 147. Patient initiated on statin  therapy   Advance Care Planning:   Code Status: Full Code verified by patient   Consults: None   Family Communication: Discussed with patient's wife present at bedside     Physical Exam:     HENT:     Head: Normocephalic and atraumatic.     Mouth/Throat:     Mouth: Mucous membranes are moist.     Pharynx: Oropharynx is clear.  Cardiovascular: Tachycardic with irregular rate    No gallop.  Pulmonary: Bilateral crackles Abdominal: Nondistended nontender Musculoskeletal: Bilateral lower extremity edema Skin:    General: Skin is warm and dry.     Findings: No ecchymosis or rash.  Neurological:     General: No focal deficit present.     Mental Status: He is alert and oriented to person, place, and time.  Psychiatric:        Mood and Affect: Mood normal.        Behavior: Behavior normal.    Data Reviewed: I reviewed the patient's alcohol, lab results as well as consultant documentation    Vitals:   03/04/23 1400 03/04/23 1500 03/04/23 1600 03/04/23 1625  BP: 129/89 (!) 132/111 (!) 143/126 (!) 134/111  Pulse:    (!) 120  Resp: 17  (!) 21 (!) 23  Temp:    (!) 97.5 F (36.4 C)  TempSrc:    Oral  SpO2: 96% 95% 98% 96%  Weight:      Height:         Author: Loyce Dys, MD 03/04/2023 5:58 PM  For on call review [email or web address].

## 2023-03-04 NOTE — Progress Notes (Signed)
Transition of Care [facility]) - Inpatient Brief Assessment   Patient Details  Name: Stephen [name] MRN: [id number] Date of Birth: [birth year]  Transition of Care [facility]) CM/SW Contact:    Truddie Hidden, RN Phone Number: 03/04/2023, 3:18 PM   Clinical Narrative: TOC assessing for ongoing needs and discharge planning.   Transition of Care Asessment: Insurance and Status: Insurance coverage has been reviewed Patient has primary care physician: Yes Home environment has been reviewed: Return to home Prior level of function:: Independent Prior/Current Home Services: No current home services Social Determinants of Health Reivew: SDOH reviewed no interventions necessary Readmission risk has been reviewed: Yes Transition of care needs: no transition of care needs at this time

## 2023-03-05 DIAGNOSIS — E782 Mixed hyperlipidemia: Secondary | ICD-10-CM | POA: Diagnosis not present

## 2023-03-05 DIAGNOSIS — I5023 Acute on chronic systolic (congestive) heart failure: Secondary | ICD-10-CM | POA: Diagnosis not present

## 2023-03-05 DIAGNOSIS — I4891 Unspecified atrial fibrillation: Secondary | ICD-10-CM | POA: Diagnosis not present

## 2023-03-05 DIAGNOSIS — I5020 Unspecified systolic (congestive) heart failure: Secondary | ICD-10-CM | POA: Diagnosis not present

## 2023-03-05 DIAGNOSIS — I10 Essential (primary) hypertension: Secondary | ICD-10-CM | POA: Diagnosis not present

## 2023-03-05 DIAGNOSIS — I420 Dilated cardiomyopathy: Secondary | ICD-10-CM | POA: Diagnosis not present

## 2023-03-05 LAB — COMPREHENSIVE METABOLIC PANEL
ALT: 38 U/L (ref 0–44)
AST: 26 U/L (ref 15–41)
Albumin: 3.6 g/dL (ref 3.5–5.0)
Alkaline Phosphatase: 84 U/L (ref 38–126)
Anion gap: 10 (ref 5–15)
BUN: 13 mg/dL (ref 6–20)
CO2: 23 mmol/L (ref 22–32)
Calcium: 8.8 mg/dL — ABNORMAL LOW (ref 8.9–10.3)
Chloride: 104 mmol/L (ref 98–111)
Creatinine, Ser: 0.86 mg/dL (ref 0.61–1.24)
GFR, Estimated: >60 mL/min (ref >60)
Glucose, Bld: 110 mg/dL — ABNORMAL HIGH (ref 70–99)
Potassium: 3.6 mmol/L (ref 3.5–5.1)
Sodium: 137 mmol/L (ref 135–145)
Total Bilirubin: 0.9 mg/dL (ref 0.3–1.2)
Total Protein: 6.4 g/dL — ABNORMAL LOW (ref 6.5–8.1)

## 2023-03-05 LAB — CBC WITH DIFFERENTIAL/PLATELET
Abs Immature Granulocytes: 0.02 10*3/uL (ref 0.00–0.07)
Basophils Absolute: 0.1 10*3/uL (ref 0.0–0.1)
Basophils Relative: 1 %
Eosinophils Absolute: 0.2 10*3/uL (ref 0.0–0.5)
Eosinophils Relative: 3 %
HCT: 44.8 % (ref 39.0–52.0)
Hemoglobin: 14.5 g/dL (ref 13.0–17.0)
Immature Granulocytes: 0 %
Lymphocytes Relative: 24 %
Lymphs Abs: 1.4 10*3/uL (ref 0.7–4.0)
MCH: 27.2 pg (ref 26.0–34.0)
MCHC: 32.4 g/dL (ref 30.0–36.0)
MCV: 84.1 fL (ref 80.0–100.0)
Monocytes Absolute: 0.6 10*3/uL (ref 0.1–1.0)
Monocytes Relative: 10 %
Neutro Abs: 3.6 10*3/uL (ref 1.7–7.7)
Neutrophils Relative %: 62 %
Platelets: 156 10*3/uL (ref 150–400)
RBC: 5.33 MIL/uL (ref 4.22–5.81)
RDW: 12.6 % (ref 11.5–15.5)
WBC: 5.8 10*3/uL (ref 4.0–10.5)
nRBC: 0 % (ref 0.0–0.2)

## 2023-03-05 MED ORDER — POTASSIUM CHLORIDE CRYS ER 20 MEQ PO TBCR
40.0000 meq | EXTENDED_RELEASE_TABLET | Freq: Once | ORAL | Status: AC
  Administered 2023-03-05: 40 meq via ORAL
  Filled 2023-03-05: qty 2

## 2023-03-05 MED ORDER — SODIUM CHLORIDE 0.9 % IV SOLN: INTRAVENOUS | Status: DC

## 2023-03-05 MED ORDER — METOPROLOL SUCCINATE ER 50 MG PO TB24
50.0000 mg | ORAL_TABLET | Freq: Every day | ORAL | Status: DC
  Administered 2023-03-05 – 2023-03-07 (×3): 50 mg via ORAL
  Filled 2023-03-05 (×3): qty 1

## 2023-03-05 NOTE — Progress Notes (Signed)
Progress Note   Patient: Stephen [name] WUJ:[id number] DOB: 01-May-1961 DOA: 03/02/2023     3 DOS: the patient was seen and examined on 03/05/2023     Subjective:  Patient seen and examined at bedside Admits to improvement in his shortness of breath and no chest pain Denies nausea vomiting abdominal pain Continues to wait for cardioversion by pulmonary team tomorrow   Brief hospital course: From HPI  "Stephen [name] is a 62 y.o. male with medical history significant of hypertension, hyperlipidemia, chronic back pain, anxiety, who presents to the ED due to shortness of breath and abdominal pain.   Stephen [name] states that for the last 6 weeks, he has been experiencing generalized abdominal pain that feels like he "just ate at a buffet" with early satiety.  Then he noticed that his abdomen was getting more distended and he was having to go further out on his belt buckle.  Over the last several weeks, he has been experiencing significant shortness of breath at night when trying to lay flat that he describes as suffocating.  When he would wake up during these moments, his wife, who is an Charity fundraiser, will check his oxygen and heart rate.  He states that his heart rate would be very high and his oxygen would be in the mid 80s but would recover.  Over the last couple days, he has noticed quickly progressing lower extremity swelling.  He called his PCP today who told him to go to the ED.  He denies any chest pain, palpitations, nausea, vomiting, fever, chills, recent illness.   Stephen [name] states that he was a heavy drinker with at least a case of beer per day for approximately 15 years.  He now only has a maximum of 2 beers per day and has been doing so for 2 years.  He denies any history of withdrawals."   ED course: On arrival to the ED, patient was normotensive at 120/96 with heart rate of 134.  He was saturating at 98% on room air.  He was afebrile at 97.6.  Initial workup demonstrated CBC with platelets of  148, and CMP with potassium 3.8, bicarb 22, glucose 107, creatinine 0.85, ALT 46 and GFR above 60.  BNP elevated 680.EKG demonstrated atrial fibrillation with RVR.  Patient given one-time dose of diltiazem with improvement in rates.  Chest x-ray obtained demonstrating bilateral small pleural effusions.  Patient started on Lasix and TRH contacted for admission."       Assessment and Plan:    Acute heart failure [facility]) Patient presenting with several week history of dyspnea on exertion, lower extremity swelling, orthopnea and abdominal bloating.  BNP elevated at 680 consistent with new diagnosis of acute heart failure.  Likely due to long-term uncontrolled hypertension. - Continue telemetry monitoring Echo report reviewed showing EF 20 to 25% with global hypokinesis Continue IV Lasix Continue metoprolol Cardiologist plans to add Entresto post cardioversion blood pressure tolerates Continue to monitor daily weight Input output monitoring   Acute atrial fibrillation with RVR (HCC) Patient presenting with new onset atrial fibrillation with RVR CHA2DS2-VASc of 2 for hypertension and heart failure. Cardiologist on board.  Appreciate input Continue amiodarone drip as well as Toprol Continue digoxin as recommended by cardiologist Patient awaiting TEE with cardioversion tomorrow Continuing Eliquis Continue telemetry monitoring Of care discussed with cardiology team   Elevated LFTs-improved Patient reports abdominal distention was one of his first symptoms, and given mildly elevated ALT and mildly low platelets, will evaluate additional liver  functions with PT/INR and and obtain a right upper quadrant ultrasound with evaluation for ascites as well.  Abdominal ultrasound did not show any gallstone or any abnormality Continue to monitor liver function test   Primary hypertension Monitor blood pressure closely Continue metoprolol and amiodarone Continue to hold amlodipine as recommended by  cardiology     HLD (hyperlipidemia) Last lipid panel with LDL of 147. Patient initiated on statin therapy   Advance Care Planning:   Code Status: Full Code verified by patient   Consults: None   Family Communication: Discussed with patient's wife present at bedside     Physical Exam:     HENT:     Head: Normocephalic and atraumatic.     Mouth/Throat:     Mouth: Mucous membranes are moist.     Pharynx: Oropharynx is clear.  Cardiovascular: Tachycardic with irregular rate    No gallop.  Pulmonary: Bilateral crackles Abdominal: Nondistended nontender Musculoskeletal: Bilateral lower extremity edema Skin:    General: Skin is warm and dry.     Findings: No ecchymosis or rash.  Neurological:     General: No focal deficit present.     Mental Status: He is alert and oriented to person, place, and time.  Psychiatric:        Mood and Affect: Mood normal.        Behavior: Behavior normal.     Data Reviewed: I have reviewed patient's vitals, lab results, and documentation by cardiologist as well as nurses documentation       Vitals:   03/05/23 0900 03/05/23 1209 03/05/23 1300 03/05/23 1400  BP:  (!) 140/107    Pulse:  (!) 109 99   Resp: 18 20 18 18   Temp:  97.6 F (36.4 C)    TempSrc:  Oral    SpO2:  95%    Weight:      Height:         Author: Loyce Dys, MD 03/05/2023 4:12 PM  For on call review [email or web address].

## 2023-03-05 NOTE — Progress Notes (Signed)
Rounding Note    Patient Name: Stephen [name] Date of Encounter: 03/05/2023  [facility] Cardiologist: [facility]-Dr. End  Subjective   Reports feeling well, shortness of breath improving, leg edema abdominal distention improving Telemetry reviewed, rate 100 -110 atrial fibrillation Remains on amiodarone infusion, metoprolol succinate  Inpatient Medications    Scheduled Meds:  apixaban  5 mg Oral BID   aspirin EC  81 mg Oral Daily   atorvastatin  40 mg Oral QHS   digoxin  0.125 mg Oral Daily   folic acid  1 mg Oral Daily   furosemide  40 mg Intravenous BID   metoprolol succinate  50 mg Oral Daily   sodium chloride flush  3 mL Intravenous Q12H   thiamine  100 mg Oral Daily   Continuous Infusions:  amiodarone 30 mg/hr (03/05/23 0836)   PRN Meds: acetaminophen **OR** acetaminophen, clonazePAM, gabapentin, ondansetron **OR** ondansetron (ZOFRAN) IV, polyethylene glycol   Vital Signs    Vitals:   03/05/23 0753 03/05/23 0800 03/05/23 0815 03/05/23 0900  BP: (!) 134/114     Pulse: (!) 108     Resp: 20 20 18 18   Temp: 97.8 F (36.6 C)     TempSrc: Oral     SpO2: 92%     Weight:      Height:        Intake/Output Summary (Last 24 hours) at 03/05/2023 1011 Last data filed at 03/05/2023 0431 Gross per 24 hour  Intake 837.21 ml  Output 2200 ml  Net -1362.79 ml      03/05/2023    5:00 AM 03/03/2023   12:35 AM 03/02/2023   10:01 AM  Last 3 Weights  Weight (lbs) 243 lb 6.2 oz 253 lb 12 oz 250 lb  Weight (kg) 110.4 kg 115.1 kg 113.399 kg      Telemetry    Atrial fibrillation rate 100 up to 110- Personally Reviewed  ECG     - Personally Reviewed  Physical Exam   GEN: No acute distress.   Neck: No JVD Cardiac: RRR, no murmurs, rubs, or gallops.  Respiratory: Clear to auscultation bilaterally. GI: Soft, nontender, non-distended  MS: No edema; No deformity. Neuro:  Nonfocal  Psych: Normal affect   Labs    High Sensitivity Troponin:   Recent Labs  Lab  03/02/23 1542  TROPONINIHS 7     Chemistry Recent Labs  Lab 03/02/23 1008 03/03/23 0510 03/04/23 0427 03/05/23 0517  NA 138 137 138 137  K 3.8 3.3* 3.6 3.6  CL 108 104 103 104  CO2 22 24 25 23   GLUCOSE 107* 105* 110* 110*  BUN 20 17 13 13   CREATININE 0.85 0.84 0.83 0.86  CALCIUM 8.7* 8.3* 8.4* 8.8*  PROT 6.7 6.2*  --  6.4*  ALBUMIN 3.8 3.5  --  3.6  AST 24 17  --  26  ALT 46* 36  --  38  ALKPHOS 79 76  --  84  BILITOT 1.1 0.9  --  0.9  GFRNONAA >60 >60 >60 >60  ANIONGAP 8 9 10 10     Lipids No results for input(s): "CHOL", "TRIG", "HDL", "LABVLDL", "LDLCALC", "CHOLHDL" in the last 168 hours.  Hematology Recent Labs  Lab 03/03/23 0510 03/04/23 0427 03/05/23 0517  WBC 5.5 5.6 5.8  RBC 4.99 5.04 5.33  HGB 13.7 14.2 14.5  HCT 42.0 42.3 44.8  MCV 84.2 83.9 84.1  MCH 27.5 28.2 27.2  MCHC 32.6 33.6 32.4  RDW 12.9 12.9 12.6  PLT 142* 162 156   Thyroid  Recent Labs  Lab 03/02/23 1542  TSH 2.280    BNP Recent Labs  Lab 03/02/23 1008  BNP 680.0*    DDimer No results for input(s): "DDIMER" in the last 168 hours.   Radiology    No results found.  Cardiac Studies   Echo  1. Left ventricular ejection fraction, by estimation, is 20 to 25%. The  left ventricle has severely decreased function. The left ventricle  demonstrates regional wall motion abnormalities (see scoring  diagram/findings for description). The left  ventricular internal cavity size was moderately to severely dilated. There  is moderate left ventricular hypertrophy. Left ventricular diastolic  parameters are indeterminate.   2. Right ventricular systolic function is mildly reduced. The right  ventricular size is moderately enlarged.   3. Left atrial size was mildly dilated.   4. Right atrial size was severely dilated.   5. The mitral valve is normal in structure. Mild to moderate mitral valve  regurgitation.   6. The aortic valve has an indeterminant number of cusps. There is mild   thickening of the aortic valve. Aortic valve regurgitation is not  visualized. Aortic valve sclerosis is present, with no evidence of aortic  valve stenosis.   Patient Profile     Stephen [name] is a 62 y.o. 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 Presenting with shortness of breath, orthopnea, PND past 4 to 6 weeks -5.8 L this admission on IV Lasix twice daily EF 25%, global hypokinesis -Continue on Lasix 40 mg IV daily Plan to restore normal sinus rhythm before discharge,  Plan for TEE and cardioversion tomorrow 12:30   New onset atrial fibrillation with RVR -Remains in atrial fibrillation with rates of 100-110 Continue amiodarone infusion, digoxin metoprolol succinate up to 50 daily on apixaban 5 mg twice daily for CHA2DS2-VASc score of at least 2 for stroke prophylaxis --Plan for TEE/DCCV July 5, Risk and benefits discussed in detail, n.p.o. after midnight Increase metoprolol succinate up to 50 daily   Mild hypokalemia -Potassium supplements ordered today, 40 mill equivalents -Daily BMP on Lasix   Primary hypertension -Continue amiodarone and Toprol increased up to 50 daily -PTA medications of amlodipine and Ziac remain on hold Post TEE cardioversion, plan to add Entresto if blood pressure tolerates   Hyperlipidemia -LDL 147 -Previously was taking niacin and omega-3  previous documentation of elevated LFTs, normal lipase, no changes noted to the liver on abdominal ultrasound On Lipitor 40 daily   Alcohol abuse -Previous 15-year history of heavy drinking -Has decreased to minimal drinking daily Alcohol cessation recommended in the setting of cardiomyopathy    Total encounter time more than 50 minutes  Greater than 50% was  spent in counseling and coordination of care with the patient    For questions or updates, please contact [facility] Please consult [email or web address] for contact info under        Signed, [name], MD  03/05/2023, 10:11 AM

## 2023-03-06 ENCOUNTER — Other Ambulatory Visit: Payer: Self-pay

## 2023-03-06 ENCOUNTER — Inpatient Hospital Stay
Admit: 2023-03-06 | Discharge: 2023-03-06 | Disposition: A | Discharge status: Home / Self Care | Payer: Medicaid Other | Attending: Cardiology | Admitting: Cardiology

## 2023-03-06 ENCOUNTER — Encounter
Admission: EM | Disposition: A | Discharge status: Home / Self Care | Payer: Self-pay | Source: Home / Self Care | Attending: Internal Medicine

## 2023-03-06 ENCOUNTER — Inpatient Hospital Stay: Payer: Medicaid Other | Admitting: Anesthesiology

## 2023-03-06 ENCOUNTER — Other Ambulatory Visit (HOSPITAL_COMMUNITY): Payer: Self-pay

## 2023-03-06 DIAGNOSIS — I5020 Unspecified systolic (congestive) heart failure: Secondary | ICD-10-CM

## 2023-03-06 DIAGNOSIS — I4891 Unspecified atrial fibrillation: Secondary | ICD-10-CM | POA: Diagnosis not present

## 2023-03-06 DIAGNOSIS — I10 Essential (primary) hypertension: Secondary | ICD-10-CM | POA: Diagnosis not present

## 2023-03-06 DIAGNOSIS — I361 Nonrheumatic tricuspid (valve) insufficiency: Secondary | ICD-10-CM | POA: Diagnosis not present

## 2023-03-06 DIAGNOSIS — I482 Chronic atrial fibrillation, unspecified: Secondary | ICD-10-CM | POA: Diagnosis not present

## 2023-03-06 DIAGNOSIS — I110 Hypertensive heart disease with heart failure: Secondary | ICD-10-CM | POA: Diagnosis not present

## 2023-03-06 DIAGNOSIS — I351 Nonrheumatic aortic (valve) insufficiency: Secondary | ICD-10-CM | POA: Diagnosis not present

## 2023-03-06 DIAGNOSIS — I420 Dilated cardiomyopathy: Secondary | ICD-10-CM | POA: Diagnosis not present

## 2023-03-06 DIAGNOSIS — I5023 Acute on chronic systolic (congestive) heart failure: Secondary | ICD-10-CM | POA: Diagnosis not present

## 2023-03-06 DIAGNOSIS — I340 Nonrheumatic mitral (valve) insufficiency: Secondary | ICD-10-CM

## 2023-03-06 DIAGNOSIS — Z01810 Encounter for preprocedural cardiovascular examination: Secondary | ICD-10-CM | POA: Diagnosis not present

## 2023-03-06 DIAGNOSIS — E782 Mixed hyperlipidemia: Secondary | ICD-10-CM | POA: Diagnosis not present

## 2023-03-06 HISTORY — PX: CARDIOVERSION: SHX1299

## 2023-03-06 HISTORY — PX: TEE WITHOUT CARDIOVERSION: SHX5443

## 2023-03-06 LAB — COMPREHENSIVE METABOLIC PANEL
ALT: 36 U/L (ref 0–44)
AST: 23 U/L (ref 15–41)
Albumin: 3.5 g/dL (ref 3.5–5.0)
Alkaline Phosphatase: 88 U/L (ref 38–126)
Anion gap: 8 (ref 5–15)
BUN: 15 mg/dL (ref 6–20)
CO2: 27 mmol/L (ref 22–32)
Calcium: 8.9 mg/dL (ref 8.9–10.3)
Chloride: 104 mmol/L (ref 98–111)
Creatinine, Ser: 0.85 mg/dL (ref 0.61–1.24)
GFR, Estimated: >60 mL/min (ref >60)
Glucose, Bld: 104 mg/dL — ABNORMAL HIGH (ref 70–99)
Potassium: 3.2 mmol/L — ABNORMAL LOW (ref 3.5–5.1)
Sodium: 139 mmol/L (ref 135–145)
Total Bilirubin: 1.1 mg/dL (ref 0.3–1.2)
Total Protein: 6.5 g/dL (ref 6.5–8.1)

## 2023-03-06 LAB — CBC WITH DIFFERENTIAL/PLATELET
Abs Immature Granulocytes: 0.01 10*3/uL (ref 0.00–0.07)
Basophils Absolute: 0 10*3/uL (ref 0.0–0.1)
Basophils Relative: 1 %
Eosinophils Absolute: 0.2 10*3/uL (ref 0.0–0.5)
Eosinophils Relative: 3 %
HCT: 45.1 % (ref 39.0–52.0)
Hemoglobin: 14.9 g/dL (ref 13.0–17.0)
Immature Granulocytes: 0 %
Lymphocytes Relative: 28 %
Lymphs Abs: 1.6 10*3/uL (ref 0.7–4.0)
MCH: 27.3 pg (ref 26.0–34.0)
MCHC: 33 g/dL (ref 30.0–36.0)
MCV: 82.8 fL (ref 80.0–100.0)
Monocytes Absolute: 0.6 10*3/uL (ref 0.1–1.0)
Monocytes Relative: 10 %
Neutro Abs: 3.5 10*3/uL (ref 1.7–7.7)
Neutrophils Relative %: 58 %
Platelets: 158 10*3/uL (ref 150–400)
RBC: 5.45 MIL/uL (ref 4.22–5.81)
RDW: 12.4 % (ref 11.5–15.5)
WBC: 5.9 10*3/uL (ref 4.0–10.5)
nRBC: 0 % (ref 0.0–0.2)

## 2023-03-06 LAB — ECHO TEE

## 2023-03-06 SURGERY — ECHOCARDIOGRAM, TRANSESOPHAGEAL
Anesthesia: General

## 2023-03-06 MED ORDER — MIDAZOLAM HCL 2 MG/2ML IJ SOLN
INTRAMUSCULAR | Status: DC | PRN
  Administered 2023-03-06: 2 mg via INTRAVENOUS

## 2023-03-06 MED ORDER — MIDAZOLAM HCL 2 MG/2ML IJ SOLN
INTRAMUSCULAR | Status: AC
  Filled 2023-03-06: qty 2

## 2023-03-06 MED ORDER — LIDOCAINE VISCOUS HCL 2 % MT SOLN
OROMUCOSAL | Status: AC
  Filled 2023-03-06: qty 15

## 2023-03-06 MED ORDER — POTASSIUM CHLORIDE CRYS ER 20 MEQ PO TBCR
40.0000 meq | EXTENDED_RELEASE_TABLET | Freq: Every evening | ORAL | Status: DC
  Administered 2023-03-06: 40 meq via ORAL
  Filled 2023-03-06: qty 2

## 2023-03-06 MED ORDER — SACUBITRIL-VALSARTAN 24-26 MG PO TABS
1.0000 | ORAL_TABLET | Freq: Two times a day (BID) | ORAL | Status: DC
  Administered 2023-03-06 – 2023-03-07 (×3): 1 via ORAL
  Filled 2023-03-06 (×3): qty 1

## 2023-03-06 MED ORDER — BUTAMBEN-TETRACAINE-BENZOCAINE 2-2-14 % EX AERO
INHALATION_SPRAY | CUTANEOUS | Status: AC
  Filled 2023-03-06: qty 5

## 2023-03-06 MED ORDER — POTASSIUM CHLORIDE CRYS ER 20 MEQ PO TBCR
40.0000 meq | EXTENDED_RELEASE_TABLET | Freq: Once | ORAL | Status: AC
  Administered 2023-03-06: 40 meq via ORAL
  Filled 2023-03-06: qty 2

## 2023-03-06 MED ORDER — PROPOFOL 10 MG/ML IV BOLUS
INTRAVENOUS | Status: DC | PRN
  Administered 2023-03-06 (×3): 10 mg via INTRAVENOUS
  Administered 2023-03-06: 30 mg via INTRAVENOUS
  Administered 2023-03-06: 20 mg via INTRAVENOUS

## 2023-03-06 NOTE — Progress Notes (Signed)
Transesophageal Echocardiogram :  Indication: persistent atrial; fibrillation, cardiomyopathy Requesting/ordering  physician:  Rosezetta Schlatter  Procedure: Benzocaine spray x2 and 2 mls x 2 of viscous lidocaine were given orally to provide local anesthesia to the oropharynx. The patient was positioned supine on the left side, bite block provided. The patient was moderately sedated with the doses of versed and fentanyl as detailed below.  Using digital technique an omniplane probe was advanced into the distal esophagus without incident.   Moderate sedation: 1. Sedation used:  per anesthesia  See report in EPIC  for complete details: In brief, transgastric imaging revealed normal LV function with no RWMAs and no mural apical thrombus.  .  Estimated ejection fraction was 20%.global hypokinesis  Right sided cardiac chambers were normal with no evidence of pulmonary hypertension.  Imaging of the septum showed no ASD or VSD Bubble study was negative for shunt 2D and color flow confirmed no PFO  Moderate MR, TR  The LA was well visualized in orthogonal views.  There was no spontaneous contrast and no thrombus in the LA and LA appendage   The descending thoracic aorta had no  mural aortic debris with no evidence of aneurysmal dilation or disection  Cardioversion to follow   [name] 03/06/2023 9:45 AM

## 2023-03-06 NOTE — CV Procedure (Signed)
Cardioversion procedure note For atrial fibrillation, persistent.  Procedure Details:  Consent: Risks of procedure as well as the alternatives and risks of each were explained to the (patient/caregiver).  Consent for procedure obtained.  Time Out: Verified patient identification, verified procedure, site/side was marked, verified correct patient position, special equipment/implants available, medications/allergies/relevent history reviewed, required imaging and test results available.  Performed  Patient placed on cardiac monitor, pulse oximetry, supplemental oxygen as necessary.   Sedation given: propofol IV, Dr. Suzan Slick Pacer pads placed anterior and posterior chest.   Cardioverted 2 time(s).   Cardioverted at  150J. In NSR-back to afib, 200J  Synchronized biphasic Converted to NSR   Evaluation: Findings: Post procedure EKG shows: NSR Complications: None Patient did tolerate procedure well.  Time Spent Directly with the Patient:  45 minutes   Stephen [name], M.D., Ph.D.

## 2023-03-06 NOTE — Progress Notes (Signed)
Progress Note   Patient: Stephen [name] QMV:[id number] DOB: 02/19/1961 DOA: 03/02/2023     4 DOS: the patient was seen and examined on 03/06/2023     Subjective:  Patient seen and examined at bedside this morning No rhythm in sinus after DCCV this morning Denies chest pain nausea vomiting abdominal pain   Brief hospital course: From HPI  "Stephen [name] is a 62 y.o. male with medical history significant of hypertension, hyperlipidemia, chronic back pain, anxiety, who presents to the ED due to shortness of breath and abdominal pain.   [name] states that for the last 6 weeks, he has been experiencing generalized abdominal pain that feels like he "just ate at a buffet" with early satiety.  Then he noticed that his abdomen was getting more distended and he was having to go further out on his belt buckle.  Over the last several weeks, he has been experiencing significant shortness of breath at night when trying to lay flat that he describes as suffocating.  When he would wake up during these moments, his wife, who is an Char[employer] fundraiser, will check his oxygen and heart rate.  He states that his heart rate would be very high and his oxygen would be in the mid 80s but would recover.  Over the last couple days, he has noticed quickly progressing lower extremity swelling.  He called his PCP today who told him to go to the ED.  He denies any chest pain, palpitations, nausea, vomiting, fever, chills, recent illness.   Mr. Stephen [name] states that he was a heavy drinker with at least a case of beer per day for approximately 15 years.  He now only has a maximum of 2 beers per day and has been doing so for 2 years.  He denies any history of withdrawals."   ED course: On arrival to the ED, patient was normotensive at 120/96 with heart rate of 134.  He was saturating at 98% on room air.  He was afebrile at 97.6.  Initial workup demonstrated CBC with platelets of 148, and CMP with potassium 3.8, bicarb 22, glucose 107,  creatinine 0.85, ALT 46 and GFR above 60.  BNP elevated 680.EKG demonstrated atrial fibrillation with RVR.  Patient given one-time dose of diltiazem with improvement in rates.  Chest x-ray obtained demonstrating bilateral small pleural effusions.  Patient started on Lasix and TRH contacted for admission."       Assessment and Plan:    Acute heart failure [facility]) Patient presenting with several week history of dyspnea on exertion, lower extremity swelling, orthopnea and abdominal bloating.  BNP elevated at 680 consistent with new diagnosis of acute heart failure.  Likely due to long-term uncontrolled hypertension. - Continue telemetry monitoring Echo report reviewed showing EF 20 to 25% with global hypokinesis Continue Lasix Continue metoprolol And Entresto initiated by cardiologist on 03/06/2023 Continue to monitor daily weight Input output monitoring   Acute atrial fibrillation with RVR (HCC) Patient presenting with new onset atrial fibrillation with RVR CHA2DS2-VASc of 2 for hypertension and heart failure. Cardiologist on board.  Appreciate input Continue amiodarone drip as well as Toprol Continue digoxin as recommended by cardiologist Patient underwent TEE with cardioversion today Continue Eliquis Continue telemetry monitoring Of care discussed with cardiology team   Elevated LFTs-improved Patient reports abdominal distention was one of his first symptoms, and given mildly elevated ALT and mildly low platelets, will evaluate additional liver functions with PT/INR and and obtain a right upper quadrant ultrasound with evaluation  for ascites as well.  Continue to monitor liver function closely   Primary hypertension Monitor blood pressure closely Continue metoprolol and amiodarone Continue to hold amlodipine as recommended by cardiology     HLD (hyperlipidemia) Last lipid panel with LDL of 147. Patient initiated on statin therapy   Advance Care Planning:   Code Status: Full Code  verified by patient   Consults: None   Family Communication: Discussed with patient's wife present at bedside     Physical Exam:     HENT:     Head: Normocephalic and atraumatic.     Mouth/Throat:     Mouth: Mucous membranes are moist.     Pharynx: Oropharynx is clear.  Cardiovascular: Tachycardic with irregular rate    No gallop.  Pulmonary: Bilateral crackles Abdominal: Nondistended nontender Musculoskeletal: Bilateral lower extremity edema Skin:    General: Skin is warm and dry.     Findings: No ecchymosis or rash.  Neurological:     General: No focal deficit present.     Mental Status: He is alert and oriented to person, place, and time.  Psychiatric: Mood and Affect: Mood normal.         Data Reviewed: I have reviewed patient's labs, cardiologist documentation, nurses documentation as well as previous chart   Vitals:   03/06/23 0945 03/06/23 1000 03/06/23 1156 03/06/23 1204  BP: (!) 121/90 (!) 124/90 117/81 (!) 122/91  Pulse: 67 66 73   Resp: (!) 23 (!) 23 (!) 21   Temp:   (!) 97.5 F (36.4 C)   TempSrc:   Oral   SpO2: 93% 94% 96%   Weight:      Height:         Author: Loyce Dys, MD 03/06/2023 3:35 PM  For on call review [email or web address].

## 2023-03-06 NOTE — Progress Notes (Signed)
*  PRELIMINARY RESULTS* Echocardiogram Echocardiogram Transesophageal has been performed.  Stephen [name] 03/06/2023, 11:58 AM

## 2023-03-06 NOTE — Anesthesia Preprocedure Evaluation (Addendum)
Anesthesia Evaluation  Patient identified by MRN, date of birth, ID band Patient awake  General Assessment Comment:  Patient presented with acute decompensated heart failure. Found to have EF 20%, and afib with RVR. Patient does not see a doctor regularly, had not known he had heart issues like this.  Reviewed: Allergy & Precautions, NPO status , Patient's Chart, lab work & pertinent test results  History of Anesthesia Complications Negative for: history of anesthetic complications  Airway Mallampati: II  TM Distance: >3 FB Neck ROM: Full    Dental  (+) Teeth Intact, Chipped   Pulmonary neg pulmonary ROS, neg sleep apnea, neg COPD, Patient abstained from smoking.Not current smoker   Pulmonary exam normal breath sounds clear to auscultation       Cardiovascular Exercise Tolerance: Poor METShypertension, +CHF  (-) CAD and (-) Past MI + dysrhythmias Atrial Fibrillation + Valvular Problems/Murmurs MR  Rhythm:Irregular Rate:Tachycardia - Systolic murmurs Appears well and euvolemic on exam today.  1. Left ventricular ejection fraction, by estimation, is 20 to 25%. The  left ventricle has severely decreased function. The left ventricle  demonstrates regional wall motion abnormalities (see scoring  diagram/findings for description). The left  ventricular internal cavity size was moderately to severely dilated. There  is moderate left ventricular hypertrophy. Left ventricular diastolic  parameters are indeterminate.   2. Right ventricular systolic function is mildly reduced. The right  ventricular size is moderately enlarged.   3. Left atrial size was mildly dilated.   4. Right atrial size was severely dilated.   5. The mitral valve is normal in structure. Mild to moderate mitral valve  regurgitation.   6. The aortic valve has an indeterminant number of cusps. There is mild  thickening of the aortic valve. Aortic valve regurgitation is  not  visualized. Aortic valve sclerosis is present, with no evidence of aortic  valve stenosis.     Neuro/Psych negative neurological ROS  negative psych ROS   GI/Hepatic ,neg GERD  ,,(+)     substance abuse  alcohol useLongtime alcohol use disorder, drinking a case of beer a day up until 2 year ago, when he has cut down to 6-12 beers a week.   Endo/Other  neg diabetes    Renal/GU negative Renal ROS     Musculoskeletal   Abdominal   Peds  Hematology   Anesthesia Other Findings Past Medical History: No date: Hyperlipidemia No date: Hypertension  Reproductive/Obstetrics                             Anesthesia Physical Anesthesia Plan  ASA: 3  Anesthesia Plan: General   Post-op Pain Management: Minimal or no pain anticipated   Induction: Intravenous  PONV Risk Score and Plan: 2 and Propofol infusion, TIVA, Ondansetron and Midazolam  Airway Management Planned: Nasal Cannula and Natural Airway  Additional Equipment: None  Intra-op Plan:   Post-operative Plan:   Informed Consent: I have reviewed the patients History and Physical, chart, labs and discussed the procedure including the risks, benefits and alternatives for the proposed anesthesia with the patient or authorized representative who has indicated his/her understanding and acceptance.     Dental advisory given  Plan Discussed with: CRNA and Surgeon  Anesthesia Plan Comments: (Discussed risks of anesthesia with patient, including possibility of difficulty with spontaneous ventilation under anesthesia necessitating airway intervention, PONV, and rare risks such as cardiac or respiratory or neurological events, and allergic reactions. Discussed the role of  CRNA in patient's perioperative care. Patient understands.)       Anesthesia Quick Evaluation

## 2023-03-06 NOTE — Anesthesia Procedure Notes (Signed)
Date/Time: 03/06/2023 8:46 AM  Performed by: Ginger Carne, CRNAPre-anesthesia Checklist: Patient identified, Emergency Drugs available, Suction available, Timeout performed and Patient being monitored Patient Re-evaluated:Patient Re-evaluated prior to induction Oxygen Delivery Method: Nasal cannula Preoxygenation: Pre-oxygenation with 100% oxygen Induction Type: IV induction

## 2023-03-06 NOTE — Plan of Care (Signed)
  Problem: Activity: Goal: Ability to tolerate increased activity will improve Outcome: Progressing   Problem: Cardiac: Goal: Ability to achieve and maintain adequate cardiopulmonary perfusion will improve Outcome: Progressing   Problem: Clinical Measurements: Goal: Respiratory complications will improve Outcome: Progressing   Problem: Pain Managment: Goal: General experience of comfort will improve Outcome: Progressing   Problem: Safety: Goal: Ability to remain free from injury will improve Outcome: Progressing

## 2023-03-06 NOTE — Anesthesia Postprocedure Evaluation (Signed)
Anesthesia Post Note  Patient: Stephen [name]  Procedure(s) Performed: TRANSESOPHAGEAL ECHOCARDIOGRAM CARDIOVERSION  Patient location during evaluation: Specials Recovery Anesthesia Type: General Level of consciousness: awake and alert Pain management: pain level controlled Vital Signs Assessment: post-procedure vital signs reviewed and stable Respiratory status: spontaneous breathing, nonlabored ventilation, respiratory function stable and patient connected to nasal cannula oxygen Cardiovascular status: blood pressure returned to baseline and stable Postop Assessment: no apparent nausea or vomiting Anesthetic complications: no   No notable events documented.   Last Vitals:  Vitals:   03/06/23 0945 03/06/23 1000  BP: (!) 121/90 (!) 124/90  Pulse: 67 66  Resp: (!) 23 (!) 23  Temp:    SpO2: 93% 94%    Last Pain:  Vitals:   03/06/23 1000  TempSrc:   PainSc: 0-No pain                 Corinda Gubler

## 2023-03-06 NOTE — Progress Notes (Signed)
Rounding Note    Patient Name: Stephen [name] Date of Encounter: 03/06/2023  [facility] Cardiologist: [facility]-Dr. End  Subjective   Completed transesophageal echo and cardioversion this morning, normal sinus rhythm restored Echo showing moderate TR and MR, EF 20% He does report some improvement in his shortness of breath  Inpatient Medications    Scheduled Meds:  apixaban  5 mg Oral BID   aspirin EC  81 mg Oral Daily   atorvastatin  40 mg Oral QHS   butamben-tetracaine-benzocaine       digoxin  0.125 mg Oral Daily   folic acid  1 mg Oral Daily   furosemide  40 mg Intravenous BID   lidocaine       metoprolol succinate  50 mg Oral Daily   sodium chloride flush  3 mL Intravenous Q12H   thiamine  100 mg Oral Daily   Continuous Infusions:  amiodarone 30 mg/hr (03/05/23 2052)   PRN Meds: acetaminophen **OR** acetaminophen, butamben-tetracaine-benzocaine, clonazePAM, gabapentin, lidocaine, ondansetron **OR** ondansetron (ZOFRAN) IV, polyethylene glycol   Vital Signs    Vitals:   03/06/23 0925 03/06/23 0931 03/06/23 0945 03/06/23 1000  BP: 127/84 117/83 (!) 121/90 (!) 124/90  Pulse: 68  67 66  Resp: (!) 34 16 (!) 23 (!) 23  Temp:      TempSrc:      SpO2: 92%  93% 94%  Weight:      Height:        Intake/Output Summary (Last 24 hours) at 03/06/2023 1017 Last data filed at 03/06/2023 7829 Gross per 24 hour  Intake 1485.75 ml  Output 3700 ml  Net -2214.25 ml      03/05/2023    5:00 AM 03/03/2023   12:35 AM 03/02/2023   10:01 AM  Last 3 Weights  Weight (lbs) 243 lb 6.2 oz 253 lb 12 oz 250 lb  Weight (kg) 110.4 kg 115.1 kg 113.399 kg      Telemetry    Normal sinus rhythm rate 66- Personally Reviewed  ECG     - Personally Reviewed  Physical Exam   Constitutional:  oriented to person, place, and time. No distress.  HENT:  Head: Grossly normal Eyes:  no discharge. No scleral icterus.  Neck: No JVD, no carotid bruits  Cardiovascular: Regular rate and  rhythm, no murmurs appreciated Pulmonary/Chest: Clear to auscultation bilaterally, no wheezes or rails Abdominal: Soft.  no distension.  no tenderness.  Musculoskeletal: Normal range of motion Neurological:  normal muscle tone. Coordination normal. No atrophy Skin: Skin warm and dry Psychiatric: normal affect, pleasant  Labs    High Sensitivity Troponin:   Recent Labs  Lab 03/02/23 1542  TROPONINIHS 7     Chemistry Recent Labs  Lab 03/03/23 0510 03/04/23 0427 03/05/23 0517 03/06/23 0602  NA 137 138 137 139  K 3.3* 3.6 3.6 3.2*  CL 104 103 104 104  CO2 24 25 23 27   GLUCOSE 105* 110* 110* 104*  BUN 17 13 13 15   CREATININE 0.84 0.83 0.86 0.85  CALCIUM 8.3* 8.4* 8.8* 8.9  PROT 6.2*  --  6.4* 6.5  ALBUMIN 3.5  --  3.6 3.5  AST 17  --  26 23  ALT 36  --  38 36  ALKPHOS 76  --  84 88  BILITOT 0.9  --  0.9 1.1  GFRNONAA >60 >60 >60 >60  ANIONGAP 9 10 10 8     Lipids No results for input(s): "CHOL", "TRIG", "HDL", "LABVLDL", "LDLCALC", "  CHOLHDL" in the last 168 hours.  Hematology Recent Labs  Lab 03/04/23 0427 03/05/23 0517 03/06/23 0602  WBC 5.6 5.8 5.9  RBC 5.04 5.33 5.45  HGB 14.2 14.5 14.9  HCT 42.3 44.8 45.1  MCV 83.9 84.1 82.8  MCH 28.2 27.2 27.3  MCHC 33.6 32.4 33.0  RDW 12.9 12.6 12.4  PLT 162 156 158   Thyroid  Recent Labs  Lab 03/02/23 1542  TSH 2.280    BNP Recent Labs  Lab 03/02/23 1008  BNP 680.0*    DDimer No results for input(s): "DDIMER" in the last 168 hours.   Radiology    No results found.  Cardiac Studies   Echo  1. Left ventricular ejection fraction, by estimation, is 20 to 25%. The  left ventricle has severely decreased function. The left ventricle  demonstrates regional wall motion abnormalities (see scoring  diagram/findings for description). The left  ventricular internal cavity size was moderately to severely dilated. There  is moderate left ventricular hypertrophy. Left ventricular diastolic  parameters are  indeterminate.   2. Right ventricular systolic function is mildly reduced. The right  ventricular size is moderately enlarged.   3. Left atrial size was mildly dilated.   4. Right atrial size was severely dilated.   5. The mitral valve is normal in structure. Mild to moderate mitral valve  regurgitation.   6. The aortic valve has an indeterminant number of cusps. There is mild  thickening of the aortic valve. Aortic valve regurgitation is not  visualized. Aortic valve sclerosis is present, with no evidence of aortic  valve stenosis.   Patient Profile     Stephen [name] is a 62 y.o. 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 Presenting with shortness of breath, orthopnea, PND past 4 to 6 weeks -8 L this admission on IV Lasix twice daily EF 25%, global hypokinesis -Continue on Lasix 40 mg IV twice daily with aggressive potassium repletion Normal sinus rhythm restored this morning -Will start Entresto 24/26 mg twice daily   New onset atrial fibrillation with RVR Transesophageal echo and cardioversion this morning, normal sinus rhythm restored Continue amiodarone infusion, digoxin, metoprolol succinate 50, Eliquis 5 twice daily CHA2DS2-VASc score of at least 2 for stroke prophylaxis  Mild hypokalemia Potassium 40 twice daily supplement   Primary hypertension -Continue amiodarone and Toprol increased 50 daily -PTA medications of amlodipine and Ziac remain on hold Will add Entresto 24/26 twice daily   Hyperlipidemia -LDL 147 -Previously was taking niacin and omega-3  previous documentation of elevated LFTs, normal lipase, no changes noted to the liver on abdominal ultrasound On Lipitor 40 daily   Alcohol abuse -Previous 15-year history  of heavy drinking -Has decreased to minimal drinking daily Alcohol cessation recommended in the setting of cardiomyopathy    Total encounter time more than 35 minutes  Greater than 50% was spent in counseling and coordination of care with the patient    For questions or updates, please contact [facility] Please consult [email or web address] for contact info under        Signed, [name], MD  03/06/2023, 10:17 AM

## 2023-03-06 NOTE — Transfer of Care (Signed)
Immediate Anesthesia Transfer of Care Note  Patient: Stephen [name]  Procedure(s) Performed: TRANSESOPHAGEAL ECHOCARDIOGRAM CARDIOVERSION  Patient Location:Cardiac specials  Anesthesia Type:General  Level of Consciousness: awake and drowsy  Airway & Oxygen Therapy: Patient Spontanous Breathing and Patient connected to nasal cannula oxygen  Post-op Assessment: Report given to RN and Post -op Vital signs reviewed and stable  Post vital signs: Reviewed and stable  Last Vitals:  Vitals Value Taken Time  BP 127/84 03/06/23 0925  Temp    Pulse 68 03/06/23 0925  Resp 34 03/06/23 0925  SpO2 92 % 03/06/23 0925    Last Pain:  Vitals:   03/06/23 0738  TempSrc: Oral  PainSc:          Complications: No notable events documented.

## 2023-03-07 DIAGNOSIS — I420 Dilated cardiomyopathy: Secondary | ICD-10-CM | POA: Diagnosis not present

## 2023-03-07 DIAGNOSIS — I4891 Unspecified atrial fibrillation: Secondary | ICD-10-CM | POA: Diagnosis not present

## 2023-03-07 DIAGNOSIS — I5023 Acute on chronic systolic (congestive) heart failure: Secondary | ICD-10-CM | POA: Diagnosis not present

## 2023-03-07 DIAGNOSIS — E782 Mixed hyperlipidemia: Secondary | ICD-10-CM | POA: Diagnosis not present

## 2023-03-07 DIAGNOSIS — I5020 Unspecified systolic (congestive) heart failure: Secondary | ICD-10-CM | POA: Diagnosis not present

## 2023-03-07 DIAGNOSIS — I482 Chronic atrial fibrillation, unspecified: Secondary | ICD-10-CM | POA: Diagnosis not present

## 2023-03-07 DIAGNOSIS — I10 Essential (primary) hypertension: Secondary | ICD-10-CM | POA: Diagnosis not present

## 2023-03-07 LAB — CBC WITH DIFFERENTIAL/PLATELET
Abs Immature Granulocytes: 0.02 10*3/uL (ref 0.00–0.07)
Basophils Absolute: 0.1 10*3/uL (ref 0.0–0.1)
Basophils Relative: 1 %
Eosinophils Absolute: 0.2 10*3/uL (ref 0.0–0.5)
Eosinophils Relative: 3 %
HCT: 47.2 % (ref 39.0–52.0)
Hemoglobin: 15.7 g/dL (ref 13.0–17.0)
Immature Granulocytes: 0 %
Lymphocytes Relative: 26 %
Lymphs Abs: 1.6 10*3/uL (ref 0.7–4.0)
MCH: 27 pg (ref 26.0–34.0)
MCHC: 33.3 g/dL (ref 30.0–36.0)
MCV: 81.1 fL (ref 80.0–100.0)
Monocytes Absolute: 0.6 10*3/uL (ref 0.1–1.0)
Monocytes Relative: 9 %
Neutro Abs: 3.8 10*3/uL (ref 1.7–7.7)
Neutrophils Relative %: 61 %
Platelets: 179 10*3/uL (ref 150–400)
RBC: 5.82 MIL/uL — ABNORMAL HIGH (ref 4.22–5.81)
RDW: 12.4 % (ref 11.5–15.5)
WBC: 6.2 10*3/uL (ref 4.0–10.5)
nRBC: 0 % (ref 0.0–0.2)

## 2023-03-07 LAB — COMPREHENSIVE METABOLIC PANEL
ALT: 45 U/L — ABNORMAL HIGH (ref 0–44)
AST: 34 U/L (ref 15–41)
Albumin: 3.4 g/dL — ABNORMAL LOW (ref 3.5–5.0)
Alkaline Phosphatase: 92 U/L (ref 38–126)
Anion gap: 10 (ref 5–15)
BUN: 15 mg/dL (ref 6–20)
CO2: 27 mmol/L (ref 22–32)
Calcium: 9 mg/dL (ref 8.9–10.3)
Chloride: 102 mmol/L (ref 98–111)
Creatinine, Ser: 0.94 mg/dL (ref 0.61–1.24)
GFR, Estimated: >60 mL/min (ref >60)
Glucose, Bld: 105 mg/dL — ABNORMAL HIGH (ref 70–99)
Potassium: 3.5 mmol/L (ref 3.5–5.1)
Sodium: 139 mmol/L (ref 135–145)
Total Bilirubin: 1.1 mg/dL (ref 0.3–1.2)
Total Protein: 6.8 g/dL (ref 6.5–8.1)

## 2023-03-07 MED ORDER — ATORVASTATIN CALCIUM 40 MG PO TABS: 40.0000 mg | ORAL_TABLET | Freq: Every day | ORAL | 0 refills | Status: DC

## 2023-03-07 MED ORDER — METOPROLOL SUCCINATE ER 50 MG PO TB24: 50.0000 mg | ORAL_TABLET | Freq: Every day | ORAL | 0 refills | Status: DC

## 2023-03-07 MED ORDER — AMIODARONE HCL 200 MG PO TABS: 200.0000 mg | ORAL_TABLET | Freq: Two times a day (BID) | ORAL | 0 refills | Status: DC

## 2023-03-07 MED ORDER — SACUBITRIL-VALSARTAN 49-51 MG PO TABS: 1.0000 | ORAL_TABLET | Freq: Two times a day (BID) | ORAL | Status: DC

## 2023-03-07 MED ORDER — FOLIC ACID 1 MG PO TABS: 1.0000 mg | ORAL_TABLET | Freq: Every day | ORAL | 0 refills | Status: DC

## 2023-03-07 MED ORDER — DIGOXIN 125 MCG PO TABS: 0.1250 mg | ORAL_TABLET | Freq: Every day | ORAL | 0 refills | Status: DC

## 2023-03-07 MED ORDER — AMIODARONE HCL 200 MG PO TABS: 200.0000 mg | ORAL_TABLET | Freq: Every day | ORAL | Status: DC

## 2023-03-07 MED ORDER — FUROSEMIDE 40 MG PO TABS: 40.0000 mg | ORAL_TABLET | Freq: Two times a day (BID) | ORAL | Status: DC

## 2023-03-07 MED ORDER — POTASSIUM CHLORIDE CRYS ER 20 MEQ PO TBCR: 20.0000 meq | EXTENDED_RELEASE_TABLET | Freq: Two times a day (BID) | ORAL | Status: DC

## 2023-03-07 MED ORDER — POLYETHYLENE GLYCOL 3350 17 G PO PACK: 17.0000 g | PACK | Freq: Every day | ORAL | 0 refills | Status: AC | PRN

## 2023-03-07 MED ORDER — AMIODARONE HCL 200 MG PO TABS
400.0000 mg | ORAL_TABLET | Freq: Two times a day (BID) | ORAL | Status: DC
  Administered 2023-03-07: 400 mg via ORAL
  Filled 2023-03-07: qty 2

## 2023-03-07 MED ORDER — POTASSIUM CHLORIDE CRYS ER 20 MEQ PO TBCR: 20.0000 meq | EXTENDED_RELEASE_TABLET | Freq: Two times a day (BID) | ORAL | 0 refills | Status: DC

## 2023-03-07 MED ORDER — POTASSIUM CHLORIDE CRYS ER 20 MEQ PO TBCR
40.0000 meq | EXTENDED_RELEASE_TABLET | Freq: Every day | ORAL | Status: DC
  Administered 2023-03-07: 40 meq via ORAL
  Filled 2023-03-07: qty 2

## 2023-03-07 MED ORDER — VITAMIN B-1 100 MG PO TABS: 100.0000 mg | ORAL_TABLET | Freq: Every day | ORAL | 0 refills | Status: AC

## 2023-03-07 MED ORDER — AMIODARONE HCL 400 MG PO TABS: 400.0000 mg | ORAL_TABLET | Freq: Two times a day (BID) | ORAL | 0 refills | Status: DC

## 2023-03-07 MED ORDER — SACUBITRIL-VALSARTAN 49-51 MG PO TABS: 1.0000 | ORAL_TABLET | Freq: Two times a day (BID) | ORAL | 0 refills | Status: DC

## 2023-03-07 MED ORDER — APIXABAN 5 MG PO TABS: 5.0000 mg | ORAL_TABLET | Freq: Two times a day (BID) | ORAL | 3 refills | Status: DC

## 2023-03-07 MED ORDER — FUROSEMIDE 40 MG PO TABS: 40.0000 mg | ORAL_TABLET | Freq: Two times a day (BID) | ORAL | 0 refills | Status: DC

## 2023-03-07 NOTE — Plan of Care (Signed)
Problem: Education: Goal: Ability to demonstrate management of disease process will improve 03/07/2023 1107 by Jacki Cones, RN Outcome: Adequate for Discharge 03/07/2023 1107 by Jacki Cones, RN Outcome: Adequate for Discharge Goal: Ability to verbalize understanding of medication therapies will improve 03/07/2023 1107 by Jacki Cones, RN Outcome: Adequate for Discharge 03/07/2023 1107 by Jacki Cones, RN Outcome: Adequate for Discharge Goal: Individualized Educational Video(s) 03/07/2023 1107 by Jacki Cones, RN Outcome: Adequate for Discharge 03/07/2023 1107 by Jacki Cones, RN Outcome: Adequate for Discharge   Problem: Activity: Goal: Capacity to carry out activities will improve 03/07/2023 1107 by Jacki Cones, RN Outcome: Adequate for Discharge 03/07/2023 1107 by Jacki Cones, RN Outcome: Adequate for Discharge   Problem: Cardiac: Goal: Ability to achieve and maintain adequate cardiopulmonary perfusion will improve 03/07/2023 1107 by Jacki Cones, RN Outcome: Adequate for Discharge 03/07/2023 1107 by Jacki Cones, RN Outcome: Adequate for Discharge   Problem: Education: Goal: Knowledge of disease or condition will improve 03/07/2023 1107 by Jacki Cones, RN Outcome: Adequate for Discharge 03/07/2023 1107 by Jacki Cones, RN Outcome: Adequate for Discharge Goal: Understanding of medication regimen will improve 03/07/2023 1107 by Jacki Cones, RN Outcome: Adequate for Discharge 03/07/2023 1107 by Jacki Cones, RN Outcome: Adequate for Discharge Goal: Individualized Educational Video(s) 03/07/2023 1107 by Jacki Cones, RN Outcome: Adequate for Discharge 03/07/2023 1107 by Jacki Cones, RN Outcome: Adequate for Discharge   Problem: Activity: Goal: Ability to tolerate increased activity will improve 03/07/2023 1107 by Jacki Cones, RN Outcome: Adequate for Discharge 03/07/2023  1107 by Jacki Cones, RN Outcome: Adequate for Discharge   Problem: Cardiac: Goal: Ability to achieve and maintain adequate cardiopulmonary perfusion will improve 03/07/2023 1107 by Jacki Cones, RN Outcome: Adequate for Discharge 03/07/2023 1107 by Jacki Cones, RN Outcome: Adequate for Discharge   Problem: Health Behavior/Discharge Planning: Goal: Ability to safely manage health-related needs after discharge will improve 03/07/2023 1107 by Jacki Cones, RN Outcome: Adequate for Discharge 03/07/2023 1107 by Jacki Cones, RN Outcome: Adequate for Discharge   Problem: Health Behavior/Discharge Planning: Goal: Ability to manage health-related needs will improve 03/07/2023 1107 by Jacki Cones, RN Outcome: Adequate for Discharge 03/07/2023 1107 by Jacki Cones, RN Outcome: Adequate for Discharge   Problem: Clinical Measurements: Goal: Ability to maintain clinical measurements within normal limits will improve 03/07/2023 1107 by Jacki Cones, RN Outcome: Adequate for Discharge 03/07/2023 1107 by Jacki Cones, RN Outcome: Adequate for Discharge Goal: Will remain free from infection 03/07/2023 1107 by Jacki Cones, RN Outcome: Adequate for Discharge 03/07/2023 1107 by Jacki Cones, RN Outcome: Adequate for Discharge Goal: Diagnostic test results will improve 03/07/2023 1107 by Jacki Cones, RN Outcome: Adequate for Discharge 03/07/2023 1107 by Jacki Cones, RN Outcome: Adequate for Discharge Goal: Respiratory complications will improve 03/07/2023 1107 by Jacki Cones, RN Outcome: Adequate for Discharge 03/07/2023 1107 by Jacki Cones, RN Outcome: Adequate for Discharge Goal: Cardiovascular complication will be avoided 03/07/2023 1107 by Jacki Cones, RN Outcome: Adequate for Discharge 03/07/2023 1107 by Jacki Cones, RN Outcome: Adequate for Discharge   Problem: Activity: Goal: Risk  for activity intolerance will decrease 03/07/2023 1107 by Jacki Cones, RN Outcome: Adequate for Discharge 03/07/2023 1107 by Jacki Cones, RN Outcome: Adequate for Discharge   Problem: Nutrition: Goal: Adequate nutrition will be maintained 03/07/2023 1107 by Jacki Cones, RN Outcome:  Adequate for Discharge 03/07/2023 1107 by Jacki Cones, RN Outcome: Adequate for Discharge   Problem: Coping: Goal: Level of anxiety will decrease 03/07/2023 1107 by Jacki Cones, RN Outcome: Adequate for Discharge 03/07/2023 1107 by Jacki Cones, RN Outcome: Adequate for Discharge   Problem: Elimination: Goal: Will not experience complications related to bowel motility 03/07/2023 1107 by Jacki Cones, RN Outcome: Adequate for Discharge 03/07/2023 1107 by Jacki Cones, RN Outcome: Adequate for Discharge Goal: Will not experience complications related to urinary retention 03/07/2023 1107 by Jacki Cones, RN Outcome: Adequate for Discharge 03/07/2023 1107 by Jacki Cones, RN Outcome: Adequate for Discharge   Problem: Pain Managment: Goal: General experience of comfort will improve 03/07/2023 1107 by Jacki Cones, RN Outcome: Adequate for Discharge 03/07/2023 1107 by Jacki Cones, RN Outcome: Adequate for Discharge   Problem: Safety: Goal: Ability to remain free from injury will improve 03/07/2023 1107 by Jacki Cones, RN Outcome: Adequate for Discharge 03/07/2023 1107 by Jacki Cones, RN Outcome: Adequate for Discharge   Problem: Skin Integrity: Goal: Risk for impaired skin integrity will decrease 03/07/2023 1107 by Jacki Cones, RN Outcome: Adequate for Discharge 03/07/2023 1107 by Jacki Cones, RN Outcome: Adequate for Discharge

## 2023-03-07 NOTE — Progress Notes (Signed)
Rounding Note    Patient Name: Stephen [name] Date of Encounter: 03/07/2023  [facility] Cardiologist: [facility]  Subjective   Completed transesophageal echo and cardioversion  performed yesterday normal sinus rhythm restored Echo showing moderate TR and MR, EF 20% Overnight maintaining normal sinus rhythm Feels breathing is back to his baseline  Inpatient Medications    Scheduled Meds:  [START ON 03/11/2023] amiodarone  200 mg Oral Daily   amiodarone  400 mg Oral BID   apixaban  5 mg Oral BID   aspirin EC  81 mg Oral Daily   atorvastatin  40 mg Oral QHS   digoxin  0.125 mg Oral Daily   folic acid  1 mg Oral Daily   furosemide  40 mg Oral BID   metoprolol succinate  50 mg Oral Daily   potassium chloride  20 mEq Oral BID   sacubitril-valsartan  1 tablet Oral BID   sodium chloride flush  3 mL Intravenous Q12H   thiamine  100 mg Oral Daily   Continuous Infusions:   PRN Meds: acetaminophen **OR** acetaminophen, clonazePAM, gabapentin, ondansetron **OR** ondansetron (ZOFRAN) IV, polyethylene glycol   Vital Signs    Vitals:   03/06/23 1938 03/06/23 2313 03/07/23 0350 03/07/23 0747  BP: (!) 135/91 (!) 141/91 (!) 139/95 (!) 136/95  Pulse: 73 77 68 67  Resp: 20 20 18 19   Temp: 97.7 F (36.5 C) 98.3 F (36.8 C) 98.1 F (36.7 C) 98 F (36.7 C)  TempSrc: Oral Oral Oral Oral  SpO2: 96% 97% 96% 93%  Weight:    103.3 kg  Height:        Intake/Output Summary (Last 24 hours) at 03/07/2023 1248 Last data filed at 03/07/2023 1039 Gross per 24 hour  Intake 390.01 ml  Output 3700 ml  Net -3309.99 ml      03/07/2023    7:47 AM 03/05/2023    5:00 AM 03/03/2023   12:35 AM  Last 3 Weights  Weight (lbs) 227 lb 11.8 oz 243 lb 6.2 oz 253 lb 12 oz  Weight (kg) 103.3 kg 110.4 kg 115.1 kg      Telemetry    Normal sinus rhythm rate 66- Personally Reviewed  ECG     - Personally Reviewed  Physical Exam   Constitutional:  oriented to person, place, and time. No  distress.  HENT:  Head: Grossly normal Eyes:  no discharge. No scleral icterus.  Neck: No JVD, no carotid bruits  Cardiovascular: Regular rate and rhythm, no murmurs appreciated Pulmonary/Chest: Clear to auscultation bilaterally, no wheezes or rails Abdominal: Soft.  no distension.  no tenderness.  Musculoskeletal: Normal range of motion Neurological:  normal muscle tone. Coordination normal. No atrophy Skin: Skin warm and dry Psychiatric: normal affect, pleasant   Labs    High Sensitivity Troponin:   Recent Labs  Lab 03/02/23 1542  TROPONINIHS 7     Chemistry Recent Labs  Lab 03/05/23 0517 03/06/23 0602 03/07/23 0432  NA 137 139 139  K 3.6 3.2* 3.5  CL 104 104 102  CO2 23 27 27   GLUCOSE 110* 104* 105*  BUN 13 15 15   CREATININE 0.86 0.85 0.94  CALCIUM 8.8* 8.9 9.0  PROT 6.4* 6.5 6.8  ALBUMIN 3.6 3.5 3.4*  AST 26 23 34  ALT 38 36 45*  ALKPHOS 84 88 92  BILITOT 0.9 1.1 1.1  GFRNONAA >60 >60 >60  ANIONGAP 10 8 10     Lipids No results for input(s): "CHOL", "TRIG", "  HDL", "LABVLDL", "LDLCALC", "CHOLHDL" in the last 168 hours.  Hematology Recent Labs  Lab 03/05/23 0517 03/06/23 0602 03/07/23 0432  WBC 5.8 5.9 6.2  RBC 5.33 5.45 5.82*  HGB 14.5 14.9 15.7  HCT 44.8 45.1 47.2  MCV 84.1 82.8 81.1  MCH 27.2 27.3 27.0  MCHC 32.4 33.0 33.3  RDW 12.6 12.4 12.4  PLT 156 158 179   Thyroid  Recent Labs  Lab 03/02/23 1542  TSH 2.280    BNP Recent Labs  Lab 03/02/23 1008  BNP 680.0*    DDimer No results for input(s): "DDIMER" in the last 168 hours.   Radiology    ECHO TEE  Result Date: 03/06/2023    TRANSESOPHOGEAL ECHO REPORT   Patient Name:   Stephen [name] Date of Exam: 03/06/2023 Medical Rec #:  [id number]       Height:       74.0 in Accession #:    [id number]      Weight:       243.4 lb Date of Birth:  09/11/1960       BSA:          2.363 m Patient Age:    61 years        BP:           135/112 mmHg Patient Gender: M               HR:           103 bpm.  Exam Location:  [facility] Procedure: Transesophageal Echo, Color Doppler, Cardiac Doppler and Saline            Contrast Bubble Study Indications:     Atrial Fibrillation I48.91  History:         Patient has prior history of Echocardiogram examinations, most                  recent 03/03/2023. Risk Factors:Hypertension and Dyslipidemia.  Sonographer:     Cristela Blue Referring Phys:  [id number] SHERI HAMMOCK Diagnosing Phys: [name] MD PROCEDURE: After discussion of the risks and benefits of a TEE, an informed consent was obtained from the patient. TEE procedure time was 30 minutes. The transesophogeal probe was passed without difficulty through the esophogus of the patient. Imaged were obtained with the patient in a left lateral decubitus position. Local oropharyngeal anesthetic was provided with Cetacaine and viscous lidocaine. Sedation performed by different physician. Image quality was excellent. The patient's vital signs; including heart rate, blood pressure, and oxygen saturation; remained stable throughout the procedure. The patient developed no complications during the procedure. A successful direct current cardioversion was performed at 200 joules with 2 attempts.  IMPRESSIONS  1. Left ventricular ejection fraction, by estimation, is 20 to 25%. The left ventricle has severely decreased function. The left ventricle demonstrates global hypokinesis.  2. Right ventricular systolic function is moderately reduced. The right ventricular size is normal.  3. Left atrial size was moderately dilated. No left atrial/left atrial appendage thrombus was detected.  4. Right atrial size was moderately dilated.  5. The mitral valve is normal in structure. Moderate mitral valve regurgitation. No evidence of mitral stenosis.  6. Tricuspid valve regurgitation is moderate.  7. The aortic valve is normal in structure. There is mild calcification of the aortic valve. Aortic valve regurgitation is mild. Aortic valve sclerosis is  present, with no evidence of aortic valve stenosis.  8. The inferior vena cava is normal in size with greater than 50%  respiratory variability, suggesting right atrial pressure of 3 mmHg. Conclusion(s)/Recommendation(s): Normal biventricular function without evidence of hemodynamically significant valvular heart disease. FINDINGS  Left Ventricle: Left ventricular ejection fraction, by estimation, is 20 to 25%. The left ventricle has severely decreased function. The left ventricle demonstrates global hypokinesis. The left ventricular internal cavity size was normal in size. There is no left ventricular hypertrophy. Right Ventricle: The right ventricular size is normal. No increase in right ventricular wall thickness. Right ventricular systolic function is moderately reduced. Left Atrium: Left atrial size was moderately dilated. No left atrial/left atrial appendage thrombus was detected. Right Atrium: Right atrial size was moderately dilated. Pericardium: There is no evidence of pericardial effusion. Mitral Valve: The mitral valve is normal in structure. Moderate mitral valve regurgitation. No evidence of mitral valve stenosis. Tricuspid Valve: The tricuspid valve is normal in structure. Tricuspid valve regurgitation is moderate . No evidence of tricuspid stenosis. Aortic Valve: The aortic valve is normal in structure. There is mild calcification of the aortic valve. Aortic valve regurgitation is mild. Aortic valve sclerosis is present, with no evidence of aortic valve stenosis. Pulmonic Valve: The pulmonic valve was normal in structure. Pulmonic valve regurgitation is mild. No evidence of pulmonic stenosis. Aorta: The aortic root is normal in size and structure. Venous: The inferior vena cava is normal in size with greater than 50% respiratory variability, suggesting right atrial pressure of 3 mmHg. IAS/Shunts: No atrial level shunt detected by color flow Doppler. Agitated saline contrast was given intravenously to  evaluate for intracardiac shunting. [name] MD Electronically signed by [name] MD Signature Date/Time: 03/06/2023/5:12:41 PM    Final     Cardiac Studies   Echo  1. Left ventricular ejection fraction, by estimation, is 20 to 25%. The  left ventricle has severely decreased function. The left ventricle  demonstrates regional wall motion abnormalities (see scoring  diagram/findings for description). The left  ventricular internal cavity size was moderately to severely dilated. There  is moderate left ventricular hypertrophy. Left ventricular diastolic  parameters are indeterminate.   2. Right ventricular systolic function is mildly reduced. The right  ventricular size is moderately enlarged.   3. Left atrial size was mildly dilated.   4. Right atrial size was severely dilated.   5. The mitral valve is normal in structure. Mild to moderate mitral valve  regurgitation.   6. The aortic valve has an indeterminant number of cusps. There is mild  thickening of the aortic valve. Aortic valve regurgitation is not  visualized. Aortic valve sclerosis is present, with no evidence of aortic  valve stenosis.   Patient Profile     Stephen [name] is a 62 y.o. 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 Presenting with shortness of breath, orthopnea, PND past 4 to 6 weeks -11 L this admission on IV Lasix twice daily EF 25%, global hypokinesis prior to restoring normal sinus rhythm -Will transition to Lasix 40 twice daily with potassium 20 twice daily -Entresto 49/51 mg twice daily -Metoprolol succinate 50 daily -Digoxin 0.125 daily   New onset atrial fibrillation with RVR 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 Transesophageal echo and  cardioversion yesterday, normal sinus rhythm restored -Continue amiodarone 400 twice daily additional 4 days then down to 200 twice daily -Continue digoxin 0.125 daily -Eliquis  5 mg twice daily  Mild hypokalemia Potassium 20 mill equivalents twice daily   Primary hypertension Medication changes as above, will hold prior outpatient medications   Hyperlipidemia -LDL 147 -Previously was taking niacin and omega-3  previous documentation of elevated LFTs, normal lipase, no changes noted to the liver on abdominal ultrasound For now continue Lipitor 40 daily  Alcohol abuse -Previous 15-year history of heavy drinking -Has decreased to minimal drinking daily Alcohol cessation recommended in the setting of cardiomyopathy   Discharge instructions discussed in detail with patient and with hospital service  Total encounter time more than 50 minutes  Greater than 50% was spent in counseling and coordination of care with the patient    For questions or updates, please contact [facility] Please consult [email or web address] for contact info under        Signed, [name], MD  03/07/2023, 12:48 PM

## 2023-03-07 NOTE — Discharge Summary (Signed)
Physician Discharge Summary   Patient: Stephen [name] MRN: [id number] DOB: 1961-08-10  Admit date:     03/02/2023  Discharge date: 03/07/23  Discharge Physician: Loyce Dys   PCP: Malva Limes, MD     Discharge Diagnoses: Acute heart failure Stephen [name]) Acute atrial fibrillation with RVR [facility]) Patient underwent TEE with cardioversion today Elevated LFTs-improved Primary hypertension HLD (hyperlipidemia)   [name] Course:  Stephen [name] is a 62 y.o. male with medical history significant of hypertension, hyperlipidemia, chronic back pain, anxiety, who presents to the ED due to shortness of breath and abdominal pain.   Stephen [name] states that for the last 6 weeks, he has been experiencing generalized abdominal pain that feels like he "just ate at a buffet" with early satiety.  Then he noticed that his abdomen was getting more distended and he was having to go further out on his belt buckle.  Over the last several weeks, he has been experiencing significant shortness of breath at night when trying to lay flat that he describes as suffocating.  When he would wake up during these moments, his wife, who is an Charity fundraiser, will check his oxygen and heart rate.  He states that his heart rate would be very high and his oxygen would be in the mid 80s but would recover.  Over the last couple days, he has noticed quickly progressing lower extremity swelling.  He called his PCP today who told him to go to the ED.  He denies any chest pain, palpitations, nausea, vomiting, fever, chills, recent illness.   Stephen [name] states that he was a heavy drinker with at least a case of beer per day for approximately 15 years.  He now only has a maximum of 2 beers per day and has been doing so for 2 years.  He denies any history of withdrawals."   ED course: On arrival to the ED, patient was normotensive at 120/96 with heart rate of 134.  He was saturating at 98% on room air.  He was afebrile at 97.6.  Initial workup  demonstrated CBC with platelets of 148, and CMP with potassium 3.8, bicarb 22, glucose 107, creatinine 0.85, ALT 46 and GFR above 60.  BNP elevated 680.EKG demonstrated atrial fibrillation with RVR.  Patient given one-time dose of diltiazem with improvement in rates.  Chest x-ray obtained demonstrating bilateral small pleural effusions.   Patient started on Lasix and  Echo report reviewed showing EF 20 to 25% with global hypokinesis.  He was also found to have new onset A-fib with RVR requiring amiodarone infusion.  Patient was seen by cardiology and underwent TEE with DC cardioversion with restoration of sinus rhythm.  Patient doing much better now edema of the lower extremity resolved and have been cleared by cardiologist for discharge today on below listed medications as recommended by cardiology and will follow-up at the clinic for close monitoring.   Consultants: Cardiology Procedures performed: TEE with cardioversion Disposition: Home Diet recommendation:  Discharge Diet Orders (From admission, onward)     Start     Ordered   03/07/23 0000  Diet - low sodium heart healthy        03/07/23 1158           Cardiac diet DISCHARGE MEDICATION: Allergies as of 03/07/2023   No Known Allergies      Medication List     STOP taking these medications    amLODipine 10 MG tablet Commonly known as: NORVASC   amLODipine  5 MG tablet Commonly known as: NORVASC   bisoprolol-hydrochlorothiazide 10-6.25 MG tablet Commonly known as: ZIAC       TAKE these medications    amiodarone 400 MG tablet Commonly known as: PACERONE Take 1 tablet (400 mg total) by mouth 2 (two) times daily for 4 days.   amiodarone 200 MG tablet Commonly known as: PACERONE Take 1 tablet (200 mg total) by mouth 2 (two) times daily. Start taking on: March 11, 2023   apixaban 5 MG Tabs tablet Commonly known as: ELIQUIS Take 1 tablet (5 mg total) by mouth 2 (two) times daily.   aspirin 81 MG tablet Take 1  tablet by mouth daily.   atorvastatin 40 MG tablet Commonly known as: LIPITOR Take 1 tablet (40 mg total) by mouth at bedtime.   clonazePAM 0.5 MG tablet Commonly known as: KLONOPIN TAKE 0.5-1 TABLETS (0.25-0.5 MG TOTAL) BY MOUTH TWO (TWO) TIMES DAILY AS NEEDED FOR ANXIETY.   digoxin 0.125 MG tablet Commonly known as: LANOXIN Take 1 tablet (0.125 mg total) by mouth daily. Start taking on: March 08, 2023   folic acid 1 MG tablet Commonly known as: FOLVITE Take 1 tablet (1 mg total) by mouth daily. Start taking on: March 08, 2023   furosemide 40 MG tablet Commonly known as: LASIX Take 1 tablet (40 mg total) by mouth 2 (two) times daily.   gabapentin 600 MG tablet Commonly known as: NEURONTIN Take 1 tablet (600 mg total) by mouth 3 (three) times daily as needed.   metoprolol succinate 50 MG 24 hr tablet Commonly known as: TOPROL-XL Take 1 tablet (50 mg total) by mouth daily. Take with or immediately following a meal. Start taking on: March 08, 2023   niacin 500 MG tablet Commonly known as: VITAMIN B3 Take 1 tablet by mouth daily.   OMEGA-3 FATTY ACIDS PO Take 1 capsule by mouth daily.   polyethylene glycol 17 g packet Commonly known as: MIRALAX / GLYCOLAX Take 17 g by mouth daily as needed for mild constipation.   potassium chloride SA 20 MEQ tablet Commonly known as: KLOR-CON M Take 1 tablet (20 mEq total) by mouth 2 (two) times daily.   sacubitril-valsartan 49-51 MG Commonly known as: ENTRESTO Take 1 tablet by mouth 2 (two) times daily.   thiamine 100 MG tablet Commonly known as: Vitamin B-1 Take 1 tablet (100 mg total) by mouth daily. Start taking on: March 08, 2023        Discharge Exam: Ceasar Mons Weights   03/03/23 0035 03/05/23 0500 03/07/23 0747  Weight: 115.1 kg 110.4 kg 103.3 kg   HENT:     Head: Normocephalic and atraumatic.     Mouth/Throat:     Mouth: Mucous membranes are moist.     Pharynx: Oropharynx is clear.  Cardiovascular: Normal sinus  rhythm    No gallop.  Pulmonary: Bilateral crackles Abdominal: Nondistended nontender Musculoskeletal: Bilateral lower extremity edema Skin:    General: Skin is warm and dry.     Findings: No ecchymosis or rash.  Neurological:     General: No focal deficit present.     Mental Status: He is alert and oriented to person, place, and time.  Psychiatric: Mood and Affect: Mood normal.  Condition at discharge: good    Discharge time spent: 33 minutes  Signed: Loyce Dys, MD [facility] 03/07/2023

## 2023-03-09 ENCOUNTER — Telehealth: Payer: Self-pay

## 2023-03-09 ENCOUNTER — Encounter: Payer: Self-pay | Admitting: Cardiovascular Disease

## 2023-03-09 NOTE — Transitions of Care (Post Inpatient/ED Visit) (Unsigned)
   03/09/2023  Name: Stephen [name] MRN: [id number] DOB: 10/19/1960  Today's TOC FU Call Status: Today's TOC FU Call Status:: Unsuccessul Call (1st Attempt) Unsuccessful Call (1st Attempt) Date: 03/09/23  Attempted to reach the patient regarding the most recent Inpatient/ED visit.  Follow Up Plan: Additional outreach attempts will be made to reach the patient to complete the Transitions of Care (Post Inpatient/ED visit) call.   Signature Karena Addison, LPN [facility] Nurse Health Advisor Direct Dial [phone]

## 2023-03-10 NOTE — Transitions of Care (Post Inpatient/ED Visit) (Signed)
   03/10/2023  Name: Stephen [name] MRN: [id number] DOB: [birth year]  Today's TOC FU Call Status: Today's TOC FU Call Status:: Unsuccessful Call (2nd Attempt) Unsuccessful Call (1st Attempt) Date: 03/09/23 Unsuccessful Call (2nd Attempt) Date: 03/10/23  Attempted to reach the patient regarding the most recent Inpatient/ED visit.  Follow Up Plan: Additional outreach attempts will be made to reach the patient to complete the Transitions of Care (Post Inpatient/ED visit) call.   Signature Karena Addison, LPN [facility] Nurse Health Advisor Direct Dial [phone]

## 2023-03-11 NOTE — Telephone Encounter (Signed)
Copied from CRM [id number]. Topic: General - Other >> Mar 11, 2023 11:57 AM Epimenio Foot F wrote: Reason for CRM: Pt is calling back returning a call from [facility]

## 2023-03-27 ENCOUNTER — Ambulatory Visit (INDEPENDENT_AMBULATORY_CARE_PROVIDER_SITE_OTHER): Payer: Medicaid Other | Admitting: Family Medicine

## 2023-03-27 ENCOUNTER — Encounter: Payer: Self-pay | Admitting: Family Medicine

## 2023-03-27 VITALS — BP 126/106 | HR 56 | Temp 97.7°F | Ht 74.02 in | Wt 228.2 lb

## 2023-03-27 DIAGNOSIS — I509 Heart failure, unspecified: Secondary | ICD-10-CM

## 2023-03-27 DIAGNOSIS — I4891 Unspecified atrial fibrillation: Secondary | ICD-10-CM

## 2023-03-27 DIAGNOSIS — I5020 Unspecified systolic (congestive) heart failure: Secondary | ICD-10-CM | POA: Diagnosis not present

## 2023-03-27 DIAGNOSIS — E782 Mixed hyperlipidemia: Secondary | ICD-10-CM | POA: Diagnosis not present

## 2023-03-27 NOTE — Progress Notes (Unsigned)
Established patient visit   Patient: Stephen [name]   DOB: 1960-11-11   62 y.o. Male  MRN: [id number] Visit Date: 03/27/2023  Today's healthcare provider: Mila Merry, MD   Chief Complaint  Patient presents with   Hospitalization Follow-up   Subjective    HPI  Presented to ED 03/02/23 with 2 weeks of progressive shortness of breath and abdominal pain. Found to be in atrial fibrillation. Echo showed EF = 20-25% with globabl hypkinesis. Cardioverted with resolution of symptoms. Discharged on amiodarone, Eliquis, Entresto metoprolol ER, potassium and BID furosemide. Bisoproll-hctz and amlodipine were discontinued. States he now feels completely better, no shortness of breath abdominal discomfort or swelling. Has had no other healthcare follows since he was discharged on 03-07-2023. Was followed by cardiology, Dr. Mariah Milling, while hospitalized.   Medications: Outpatient Medications Prior to Visit  Medication Sig   amiodarone (PACERONE) 200 MG tablet Take 1 tablet (200 mg total) by mouth 2 (two) times daily.   apixaban (ELIQUIS) 5 MG TABS tablet Take 1 tablet (5 mg total) by mouth 2 (two) times daily.   aspirin 81 MG tablet Take 1 tablet by mouth daily.   atorvastatin (LIPITOR) 40 MG tablet Take 1 tablet (40 mg total) by mouth at bedtime.   clonazePAM (KLONOPIN) 0.5 MG tablet TAKE 0.5-1 TABLETS (0.25-0.5 MG TOTAL) BY MOUTH TWO (TWO) TIMES DAILY AS NEEDED FOR ANXIETY.   digoxin (LANOXIN) 0.125 MG tablet Take 1 tablet (0.125 mg total) by mouth daily.   folic acid (FOLVITE) 1 MG tablet Take 1 tablet (1 mg total) by mouth daily.   furosemide (LASIX) 40 MG tablet Take 1 tablet (40 mg total) by mouth 2 (two) times daily.   gabapentin (NEURONTIN) 600 MG tablet Take 1 tablet (600 mg total) by mouth 3 (three) times daily as needed.   metoprolol succinate (TOPROL-XL) 50 MG 24 hr tablet Take 1 tablet (50 mg total) by mouth daily. Take with or immediately following a meal.   niacin 500 MG tablet  Take 1 tablet by mouth daily.   OMEGA-3 FATTY ACIDS PO Take 1 capsule by mouth daily.   polyethylene glycol (MIRALAX / GLYCOLAX) 17 g packet Take 17 g by mouth daily as needed for mild constipation.   potassium chloride SA (KLOR-CON M) 20 MEQ tablet Take 1 tablet (20 mEq total) by mouth 2 (two) times daily.   sacubitril-valsartan (ENTRESTO) 49-51 MG Take 1 tablet by mouth 2 (two) times daily.   thiamine (VITAMIN B-1) 100 MG tablet Take 1 tablet (100 mg total) by mouth daily.   amiodarone (PACERONE) 400 MG tablet Take 1 tablet (400 mg total) by mouth 2 (two) times daily for 4 days.   No facility-administered medications prior to visit.       Objective    BP (!) 126/106   Pulse (!) 56   Temp 97.7 F (36.5 C) (Oral)   Ht 6' 2.02" (1.88 m)   Wt 228 lb 3.2 oz (103.5 kg)   SpO2 100%   BMI 29.29 kg/m   Physical Exam   General: Appearance:    Well developed, well nourished male in no acute distress  Eyes:    PERRL, conjunctiva/corneas clear, EOM's intact       Lungs:     Clear to auscultation bilaterally, respirations unlabored  Heart:    Bradycardic. Regularly irregular rhythm. No murmurs, rubs, or gallops.    MS:   All extremities are intact.    Neurologic:   Awake, alert,  oriented x 3. No apparent focal neurological defect.         Assessment & Plan     1. New onset atrial fibrillation (HCC) Electrocardioverted during hospitalization 3 weeks ago, maintained on amiodarone. Normal rhythm on exam today. On DOAC. Needs cardiology follow up .  - CBC - Comprehensive metabolic panel - Magnesium - TSH   2. Acute congestive heart failure, unspecified heart failure type (HCC) Asymptomatic since cardioversion. Now on entresto, metoprolol, amiodarone, and furosemide.  - Ambulatory referral to Ophthalmology - Ambulatory referral to Cardiology  3. Mixed hyperlipidemia - Direct LDL  4. Systolic CHF with reduced left ventricular function, NYHA class 3 (HCC)  - Ambulatory referral to  Cardiology        Mila Merry, MD  [facility] [phone] (phone) [phone] (fax)  [facility]

## 2023-04-02 DIAGNOSIS — Z419 Encounter for procedure for purposes other than remedying health state, unspecified: Secondary | ICD-10-CM | POA: Diagnosis not present

## 2023-04-07 ENCOUNTER — Other Ambulatory Visit: Payer: Self-pay | Admitting: Family Medicine

## 2023-04-07 NOTE — Telephone Encounter (Signed)
Medication Refill - Medication: potassium chloride SA (KLOR-CON M) 20 MEQ tablet , sacubitril-valsartan (ENTRESTO) 49-51 MG , amiodarone (PACERONE) 200 MG tablet , thiamine (VITAMIN B-1) 100 MG tablet , digoxin (LANOXIN) 0.125 MG tablet , folic acid (FOLVITE) 1 MG tablet , metoprolol succinate (TOPROL-XL) 50 MG 24 hr tablet , furosemide (LASIX) 40 MG tablet , atorvastatin (LIPITOR) 40 MG tablet   Has the patient contacted their pharmacy? Yes.     Preferred Pharmacy (with phone number or street name):  [facility] - [place], [place] - [address] Phone: [phone]  Fax: [phone]      Has the patient been seen for an appointment in the last year OR does the patient have an upcoming appointment? Yes.    Please assist patient further

## 2023-04-08 NOTE — Telephone Encounter (Signed)
Requested medication (s) are due for refill today: yes  Requested medication (s) are on the active medication list: yes  Last refill:  03/07/23  Future visit scheduled: no  Notes to clinic:  Unable to refill per protocol, last refill by ED provider. Routing for approval from PCP.     Requested Prescriptions  Pending Prescriptions Disp Refills   potassium chloride SA (KLOR-CON M) 20 MEQ tablet 60 tablet 0    Sig: Take 1 tablet (20 mEq total) by mouth 2 (two) times daily.     Endocrinology:  Minerals - Potassium Supplementation Passed - 04/07/2023  1:41 PM      Passed - K in normal range and within 360 days    Potassium  Date Value Ref Range Status  03/27/2023 4.5 3.5 - 5.2 mmol/L Final         Passed - Cr in normal range and within 360 days    Creat  Date Value Ref Range Status  06/22/2017 0.79 0.70 - 1.33 mg/dL Final    Comment:    For patients >5 years of age, the reference limit for Creatinine is approximately 13% higher for people identified as African-American. .    Creatinine, Ser  Date Value Ref Range Status  03/27/2023 1.16 0.76 - 1.27 mg/dL Final         Passed - Valid encounter within last 12 months    Recent Outpatient Visits           1 week ago New onset atrial fibrillation [facility])   [place] [facility] Malva Limes, MD   3 months ago Primary hypertension   [place] [facility] Malva Limes, MD   4 months ago Mixed hyperlipidemia   [place] [facility] Mal[facility] L[facility], MD   3 years ago Chronic midline low back pain without sciatica   [place] [facility] Malva Limes, MD   3 years ago Chronic bilateral low back pain without sciatica   [place] [facility] Malva Limes, MD       Future Appointments             In 3 weeks Iran Ouch, MD [place] [facility]             sacubitril-valsartan (ENTRESTO) 49-51 MG 60  tablet 0    Sig: Take 1 tablet by mouth 2 (two) times daily.     Off-Protocol Failed - 04/07/2023  1:41 PM      Failed - Medication not assigned to a protocol, review manually.      Passed - Valid encounter within last 12 months    Recent Outpatient Visits           1 week ago New onset atrial fibrillation [facility])   [place] [facility] Malva Limes, MD   3 months ago Primary hypertension   [place] [facility] Malva Limes, MD   4 months ago Mixed hyperlipidemia   [place] [facility] Malva Limes, MD   3 years ago Chronic midline low back pain without sciatica   [facility] Malva Limes, MD   3 years ago Chronic bilateral low back pain without sciatica   [facility] Malva Limes, MD       Future Appointments             In 3 weeks Kirke Corin,  Chelsea Aus, MD [place] [facility]             amiodarone (PACERONE) 200 MG tablet 60 tablet 0    Sig: Take 1 tablet (200 mg total) by mouth 2 (two) times daily.     Not Delegated - Cardiovascular: Antiarrhythmic Agents - amiodarone Failed - 04/07/2023  1:41 PM      Failed - This refill cannot be delegated      Failed - Manual Review: Eye exam recommended every 12 months      Failed - Mg Level in normal range and within 360 days    Magnesium  Date Value Ref Range Status  03/27/2023 2.4 (H) 1.6 - 2.3 mg/dL Final         Failed - Last BP in normal range    BP Readings from Last 1 Encounters:  03/27/23 (!) 126/106         Passed - TSH in normal range and within 360 days    TSH  Date Value Ref Range Status  03/27/2023 3.100 0.450 - 4.500 uIU/mL Final         Passed - K in normal range and within 180 days    Potassium  Date Value Ref Range Status  03/27/2023 4.5 3.5 - 5.2 mmol/L Final         Passed - AST in normal range and within 180 days    AST  Date Value Ref Range Status   03/27/2023 30 0 - 40 IU/L Final         Passed - ALT in normal range and within 180 days    ALT  Date Value Ref Range Status  03/27/2023 43 0 - 44 IU/L Final         Passed - Patient had ECG in the last 180 days      Passed - Patient is not pregnant      Passed - Last Heart Rate in normal range    Pulse Readings from Last 1 Encounters:  03/27/23 (!) 56         Passed - Valid encounter within last 6 months    Recent Outpatient Visits           1 week ago New onset atrial fibrillation (HCC)   [place] [facility] Malva L[facility], MD   3 months ago Primary hypertension   [place] [facility] Malva L[facility], MD   4 months ago Mixed hyperlipidemia   [place] [facility] Malva L[facility], MD   3 years ago Chronic midline low back pain without sciatica   [place] Mal[facility], MD   3 years ago Chronic bilateral low back pain without sciatica   [place] [facility] Malva Limes, MD       Future Appointments             In 3 weeks Iran Ouch, MD [place] [facility] - Patient had chest x-ray within the last 6 months       thiamine (VITAMIN B-1) 100 MG tablet 30 tablet 0    Sig: Take 1 tablet (100 mg total) by mouth daily.     Off-Protocol Failed - 04/07/2023  1:41 PM      Failed - Medication not assigned to a protocol, review manually.      Passed -  Valid encounter within last 12 months    Recent Outpatient Visits           1 week ago New onset atrial fibrillation [facility])   [place] [facility] Mal[facility], MD   3 months ago Primary hypertension   [place] [facility] Malva Limes, MD   4 months ago Mixed hyperlipidemia   [place] [facility] Malva L[facility], MD   3 years ago Chronic midline low back pain without sciatica   [place] [facility] Malva L[facility], MD   3 years ago Chronic bilateral low back pain without sciatica   [place] [facility] Malva Limes, MD       Future Appointments             In 3 weeks Iran Ouch, MD [place] [facility]             metoprolol succinate (TOPROL-XL) 50 MG 24 hr tablet 30 tablet 0    Sig: Take 1 tablet (50 mg total) by mouth daily. Take with or immediately following a meal.     Cardiovascular:  Beta Blockers Failed - 04/07/2023  1:41 PM      Failed - Last BP in normal range    BP Readings from Last 1 Encounters:  03/27/23 (!) 126/106         Passed - Last Heart Rate in normal range    Pulse Readings from Last 1 Encounters:  03/27/23 (!) 56         Passed - Valid encounter within last 6 months    Recent Outpatient Visits           1 week ago New onset atrial fibrillation (HCC)   [place] [facility] Sherrie Mustache, Demetrios Isaacs, MD   3 months ago Primary hypertension   [place] [facility] Malva Limes, MD   4 months ago Mixed hyperlipidemia   [place] [facility] Mal[facility], MD   3 years ago Chronic midline low back pain without sciatica   [place] [facility] Malva Limes, MD   3 years ago Chronic bilateral low back pain without sciatica   [place] [facility] Malva Limes, MD       Future Appointments             In 3 weeks Iran Ouch, MD  [facility] at [facility]             digoxin (LANOXIN) 0.125 MG tablet 30 tablet 0    Sig: Take 1 tablet (0.125 mg total) by mouth daily.     Cardiovascular:  Antiarrhythmic Agents - digoxin Failed - 04/07/2023  1:41 PM      Failed - Digoxin (serum) in normal range and within 360 days    No results found for: "DIGOXIN"       Failed - Mg Level in normal range and within 360 days    Magnesium  Date Value Ref Range Status  03/27/2023 2.4 (H) 1.6  - 2.3 mg/dL Final         Passed - Cr in normal range and within 180 days    Creat  Date Value Ref Range Status  06/22/2017 0.79 0.70 - 1.33 mg/dL Final    Comment:    For patients >22 years of age, the reference limit for Creatinine is approximately 13% higher  for people identified as African-American. .    Creatinine, Ser  Date Value Ref Range Status  03/27/2023 1.16 0.76 - 1.27 mg/dL Final         Passed - Ca in normal range and within 360 days    Calcium  Date Value Ref Range Status  03/27/2023 9.4 8.6 - 10.2 mg/dL Final         Passed - K in normal range and within 180 days    Potassium  Date Value Ref Range Status  03/27/2023 4.5 3.5 - 5.2 mmol/L Final         Passed - Patient had ECG in the last 360 days      Passed - Last Heart Rate in normal range    Pulse Readings from Last 1 Encounters:  03/27/23 (!) 56         Passed - Valid encounter within last 6 months    Recent Outpatient Visits           1 week ago New onset atrial fibrillation (HCC)   [place] [facility] Sherrie Mustache, Demetrios Isaacs, MD   3 months ago Primary hypertension   [place] [facility] Mal[facility] L[facility], MD   4 months ago Mixed hyperlipidemia   [place] [facility] Mal[facility], MD   3 years ago Chronic midline low back pain without sciatica   [place] [facility] Malva L[facility], MD   3 years ago Chronic bilateral low back pain without sciatica   [place] [facility] Malva Limes, MD       Future Appointments             In 3 weeks Iran Ouch, MD [place] [facility]             folic acid (FOLVITE) 1 MG tablet 30 tablet 0    Sig: Take 1 tablet (1 mg total) by mouth daily.     Endocrinology:  Vitamins Passed - 04/07/2023  1:41 PM      Passed - Valid encounter within last 12 months    Recent Outpatient Visits           1 week ago New onset atrial fibrillation  (HCC)   [place] [facility] Mal[facility] L[facility], MD   3 months ago Primary hypertension   [place] [facility] Mal[facility], MD   4 months ago Mixed hyperlipidemia   [place] [facility] Malva L[facility], MD   3 years ago Chronic midline low back pain without sciatica   [facility] Mal[facility] L[facility], MD   3 years ago Chronic bilateral low back pain without sciatica   [place] [facility] Malva Limes, MD       Future Appointments             In 3 weeks Iran Ouch, MD [place] [facility]             furosemide (LASIX) 40 MG tablet 60 tablet 0    Sig: Take 1 tablet (40 mg total) by mouth 2 (two) times daily.     Cardiovascular:  Diuretics - Loop Failed - 04/07/2023  1:41 PM      Failed - Mg Level in normal range and within 180 days    Magnesium  Date Value Ref Range Status  03/27/2023 2.4 (H) 1.6 - 2.3  mg/dL Final         Failed - Last BP in normal range    BP Readings from Last 1 Encounters:  03/27/23 (!) 126/106         Passed - K in normal range and within 180 days    Potassium  Date Value Ref Range Status  03/27/2023 4.5 3.5 - 5.2 mmol/L Final         Passed - Ca in normal range and within 180 days    Calcium  Date Value Ref Range Status  03/27/2023 9.4 8.6 - 10.2 mg/dL Final         Passed - Na in normal range and within 180 days    Sodium  Date Value Ref Range Status  03/27/2023 141 134 - 144 mmol/L Final         Passed - Cr in normal range and within 180 days    Creat  Date Value Ref Range Status  06/22/2017 0.79 0.70 - 1.33 mg/dL Final    Comment:    For patients >87 years of age, the reference limit for Creatinine is approximately 13% higher for people identified as African-American. .    Creatinine, Ser  Date Value Ref Range Status  03/27/2023 1.16 0.76 - 1.27 mg/dL Final         Passed - Cl in normal range and  within 180 days    Chloride  Date Value Ref Range Status  03/27/2023 101 96 - 106 mmol/L Final         Passed - Valid encounter within last 6 months    Recent Outpatient Visits           1 week ago New onset atrial fibrillation [facility])   [place] [facility] Mal[facility], MD   3 months ago Primary hypertension   [place] [facility] Mal[facility], MD   4 months ago Mixed hyperlipidemia   [place] [facility] Mal[facility], MD   3 years ago Chronic midline low back pain without sciatica   [place] [facility] Malva Limes, MD   3 years ago Chronic bilateral low back pain without sciatica   [place] [facility] Malva Limes, MD       Future Appointments             In 3 weeks Iran Ouch, MD  [facility]             atorvastatin (LIPITOR) 40 MG tablet 30 tablet 0    Sig: Take 1 tablet (40 mg total) by mouth at bedtime.     Cardiovascular:  Antilipid - Statins Failed - 04/07/2023  1:41 PM      Failed - Lipid Panel in normal range within the last 12 months    Cholesterol, Total  Date Value Ref Range Status  11/19/2022 236 (H) 100 - 199 mg/dL Final   LDL Cholesterol (Calc)  Date Value Ref Range Status  06/22/2017 118 (H) mg/dL (calc) Final    Comment:    Reference range: <100 . Desirable range <100 mg/dL for primary prevention;   <70 mg/dL for patients with CHD or diabetic patients  with > or = 2 CHD risk factors. [place] Kitchen LDL-C is now calculated using the Martin-Hopkins  calculation, which is a validated novel method providing  better accuracy than the Friedewald equation in the  estimation of LDL-C.  Horald Pollen et al.  JAMA. 7829;562(13): [phone]  (http://education.[employer].com/faq/FAQ164)    LDL Chol Calc (NIH)  Date Value Ref Range Status  11/19/2022 147 (H) 0 - 99 mg/dL Final   LDL Direct  Date Value Ref Range Status   03/27/2023 84 0 - 99 mg/dL Final   HDL  Date Value Ref Range Status  11/19/2022 49 >39 mg/dL Final   Triglycerides  Date Value Ref Range Status  11/19/2022 222 (H) 0 - 149 mg/dL Final         Passed - Patient is not pregnant      Passed - Valid encounter within last 12 months    Recent Outpatient Visits           1 week ago New onset atrial fibrillation [facility])   [place] [facility] Mal[facility], MD   3 months ago Primary hypertension   [place] [facility], MD   4 months ago Mixed hyperlipidemia   [place] [facility] Malva L[facility], MD   3 years ago Chronic midline low back pain without sciatica   [place] [facility] Malva Limes, MD   3 years ago Chronic bilateral low back pain without sciatica   [place] Mal[facility] Limes, MD       Future Appointments             In 3 weeks Kirke Corin, Chelsea Aus, MD [facility]

## 2023-04-09 ENCOUNTER — Telehealth: Payer: Self-pay

## 2023-04-09 ENCOUNTER — Other Ambulatory Visit: Payer: Self-pay | Admitting: Family Medicine

## 2023-04-09 NOTE — Telephone Encounter (Signed)
Copied from CRM [id number]. Topic: General - Other >> Apr 09, 2023  1:39 PM Franchot Heidelberg wrote: Reason for CRM: Pt called checking status of refill request, advised that PCP has received requests and that he must wait for PCP to correspond. >> Apr 09, 2023  1:44 PM Franchot Heidelberg wrote: Says he is currently out of everything, he says he wants 90 days for everything

## 2023-04-27 ENCOUNTER — Telehealth: Payer: Self-pay

## 2023-04-27 NOTE — Telephone Encounter (Signed)
The patient is scheduled to see Lorine Bears, MD on 04/30/2023 as a new patient. Called and left a voice message to inquire about any previous cardiac history. Requested the patient to call back at their earliest convenience.

## 2023-04-30 ENCOUNTER — Encounter: Payer: Self-pay | Admitting: Cardiovascular Disease

## 2023-04-30 ENCOUNTER — Ambulatory Visit: Payer: Medicaid Other | Attending: Cardiovascular Disease | Admitting: Cardiovascular Disease

## 2023-04-30 VITALS — BP 120/68 | HR 90 | Ht 74.0 in | Wt 228.0 lb

## 2023-04-30 DIAGNOSIS — I509 Heart failure, unspecified: Secondary | ICD-10-CM

## 2023-04-30 DIAGNOSIS — E785 Hyperlipidemia, unspecified: Secondary | ICD-10-CM

## 2023-04-30 DIAGNOSIS — I10 Essential (primary) hypertension: Secondary | ICD-10-CM | POA: Diagnosis not present

## 2023-04-30 DIAGNOSIS — I4819 Other persistent atrial fibrillation: Secondary | ICD-10-CM | POA: Diagnosis not present

## 2023-04-30 DIAGNOSIS — I5022 Chronic systolic (congestive) heart failure: Secondary | ICD-10-CM | POA: Diagnosis not present

## 2023-04-30 DIAGNOSIS — I420 Dilated cardiomyopathy: Secondary | ICD-10-CM

## 2023-04-30 DIAGNOSIS — I5020 Unspecified systolic (congestive) heart failure: Secondary | ICD-10-CM | POA: Diagnosis not present

## 2023-04-30 DIAGNOSIS — I4891 Unspecified atrial fibrillation: Secondary | ICD-10-CM

## 2023-04-30 NOTE — Progress Notes (Signed)
Cardiology Office Note   Date:  04/30/2023   ID:  Stephen [name], DOB 1961-01-26, MRN [id number]  PCP:  Malva Limes, MD  Cardiologist:   Lorine Bears, MD   Chief Complaint  Patient presents with   New Patient (Initial Visit)    Ref by Dr. Sherrie Mustache to establish care for CHF. Patient c/o dizziness and mild headache with difficulties with extreme brightness.  Medications reviewed by the patient verbally.       History of Present Illness: Stephen [name] is a 62 y.o. male who presents for a follow-up visit regarding chronic systolic heart failure and persistent atrial fibrillation. He has known history of essential hypertension, hyperlipidemia, previous heavy alcohol use, chronic back pain and anxiety. He was hospitalized at [facility] in early July with heart failure in the setting of atrial fibrillation with rapid ventricular response.  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  He improved with rate control and diuresis.  He ultimately underwent successful TEE guided cardioversion and was placed on amiodarone.  He has been doing well since hospital discharge with significant improvement in heart failure symptoms.  He denies chest pain.  He is noted to be in atrial flutter today but he denies palpitations.  He has been taking his medications regularly and has not missed any dose of Eliquis or amiodarone. No orthopnea, PND or lower extremity edema.   Past Medical History:  Diagnosis Date   Hyperlipidemia    Hypertension     Past Surgical History:  Procedure Laterality Date   CARDIOVERSION N/A 03/06/2023   Procedure: CARDIOVERSION;  Surgeon: Antonieta Iba, MD;  Location: [facility] ORS;  Service: Cardiovascular;  Laterality: N/A;   COLONOSCOPY  10/31/2013   TEE WITHOUT CARDIOVERSION N/A 03/06/2023   Procedure: TRANSESOPHAGEAL ECHOCARDIOGRAM;  Surgeon: Antonieta Iba, MD;  Location: [facility] ORS;  Service:  Cardiovascular;  Laterality: N/A;   TONSILLECTOMY AND ADENOIDECTOMY       Current Outpatient Medications  Medication Sig Dispense Refill   amiodarone (PACERONE) 200 MG tablet Take 200 mg by mouth 2 (two) times daily.     apixaban (ELIQUIS) 5 MG TABS tablet Take 1 tablet (5 mg total) by mouth 2 (two) times daily. 60 tablet 3   aspirin 81 MG tablet Take 1 tablet by mouth daily.     atorvastatin (LIPITOR) 40 MG tablet TAKE ONE TABLET (40 MG TOTAL) BY MOUTH AT BEDTIME. 30 tablet 0   clonazePAM (KLONOPIN) 0.5 MG tablet TAKE 0.5-1 TABLETS (0.25-0.5 MG TOTAL) BY MOUTH TWO (TWO) TIMES DAILY AS NEEDED FOR ANXIETY. 30 tablet 2   digoxin (LANOXIN) 0.125 MG tablet TAKE ONE TABLET (0.125 MG TOTAL) BY MOUTH DAILY. 30 tablet 2   ENTRESTO 49-51 MG TAKE ONE TABLET BY MOUTH TWO (TWO) TIMES DAILY. 60 tablet 0   folic acid (FOLVITE) 1 MG tablet TAKE ONE TABLET (1 MG TOTAL) BY MOUTH DAILY. 30 tablet 0   furosemide (LASIX) 40 MG tablet TAKE ONE TABLET (40 MG TOTAL) BY MOUTH TWO (TWO) TIMES DAILY. 60 tablet 0   gabapentin (NEURONTIN) 600 MG tablet Take 1 tablet (600 mg total) by mouth 3 (three) times daily as needed. 90 tablet 3   metoprolol succinate (TOPROL-XL) 50 MG 24 hr tablet TAKE ONE TABLET (50 MG TOTAL) BY MOUTH DAILY. TAKE WITH OR IMMEDIATELY FOLLOWING A MEAL. 30 tablet 0   niacin 500 MG tablet Take 1 tablet by mouth daily.  OMEGA-3 FATTY ACIDS PO Take 1 capsule by mouth daily.     polyethylene glycol (MIRALAX / GLYCOLAX) 17 g packet Take 17 g by mouth daily as needed for mild constipation. 14 each 0   potassium chloride SA (KLOR-CON M) 20 MEQ tablet TAKE ONE TABLET (20 MEQ TOTAL) BY MOUTH TWO (TWO) TIMES DAILY. 60 tablet 0   thiamine (VITAMIN B-1) 100 MG tablet Take 1 tablet (100 mg total) by mouth daily. (Patient not taking: Reported on 04/30/2023) 30 tablet 0   No current facility-administered medications for this visit.    Allergies:   Patient has no known allergies.    Social History:  The  patient  reports that he has never smoked. He has never used smokeless tobacco. He reports current alcohol use of about 120.0 standard drinks of alcohol per week. He reports that he does not use drugs.   Family History:  The patient's family history includes COPD in his father; Congestive Heart Failure in his mother; HIV in his brother; Heart failure in his father; Hypertension in his mother.    ROS:  Please see the history of present illness.   Otherwise, review of systems are positive for none.   All other systems are reviewed and negative.    PHYSICAL EXAM: VS:  BP 120/68 (BP Location: Right Arm, Patient Position: Sitting, Cuff Size: Normal)   Pulse 90   Ht 6\' 2"  (1.88 m)   Wt 228 lb (103.4 kg)   SpO2 97%   BMI 29.27 kg/m  , BMI Body mass index is 29.27 kg/m. GEN: Well nourished, well developed, in no acute distress  HEENT: normal  Neck: no JVD, carotid bruits, or masses Cardiac: Irregularly irregular; no murmurs, rubs, or gallops,no edema  Respiratory:  clear to auscultation bilaterally, normal work of breathing GI: soft, nontender, nondistended, + BS MS: no deformity or atrophy  Skin: warm and dry, no rash Neuro:  Strength and sensation are intact Psych: euthymic mood, full affect   EKG:  EKG is ordered today. The ekg ordered today demonstrates : Atrial flutter with variable A-V block Left axis deviation Minimal voltage criteria for LVH, may be normal variant ( Cornell product ) Nonspecific T wave abnormality When compared with ECG of 06-Mar-2023 09:19, atrial flutter replaced sinus rhythm     Recent Labs: 03/02/2023: B Natriuretic Peptide 680.0 03/27/2023: ALT 43; BUN 13; Creatinine, Ser 1.16; Hemoglobin 16.8; Magnesium 2.4; Platelets 218; Potassium 4.5; Sodium 141; TSH 3.100    Lipid Panel    Component Value Date/Time   CHOL 236 (H) 11/19/2022 1000   TRIG 222 (H) 11/19/2022 1000   HDL 49 11/19/2022 1000   CHOLHDL 2.8 01/18/2020 0901   CHOLHDL 4.2 06/22/2017 1010    LDLCALC 147 (H) 11/19/2022 1000   LDLCALC 118 (H) 06/22/2017 1010   LDLDIRECT 84 03/27/2023 1015      Wt Readings from Last 3 Encounters:  04/30/23 228 lb (103.4 kg)  03/27/23 228 lb 3.2 oz (103.5 kg)  03/07/23 227 lb 11.8 oz (103.3 kg)           No data to display            ASSESSMENT AND PLAN:  1.  Persistent atrial fibrillation/flutter: His initial presentation and July was atrial fibrillation with RVR.  Unfortunately, he is in atypical atrial flutter today.  He has not missed any dose of Eliquis and continues to take amiodarone 200 mg once daily. I recommend proceeding with cardioversion.  I discussed the  procedure in details as well as risk and benefits. Considering his young age, amiodarone is not a good long-term option.  I am referring him to Dr. Lalla Brothers to discuss ablation.  2.  Chronic systolic heart failure: Severely reduced LV systolic function with an EF of 20 to 25% thought to be due to tachycardia induced cardiomyopathy.  There was mild to moderate mitral regurgitation. He appears to be euvolemic on furosemide 40 mg twice daily.  Continue heart failure medications including Toprol and Entresto.  The plan is to reevaluate his ejection fraction once he is maintaining in sinus rhythm.  If EF does not recover, recommend a right and left cardiac catheterization to exclude the possibility of ischemic cardiomyopathy.  3.  Essential hypertension: Blood pressure is controlled on current medications.  4.  Hyperlipidemia: Currently on atorvastatin 40 mg once daily.  This is a new patient to me and I spent 45 minutes reviewing his extensive cardiac history and recent admission.   Disposition:   Proceed with cardioversion next week.  Referred to Dr. Lalla Brothers to discuss ablation.  Follow-up with [place] in 3 months.  Signed,  Lorine Bears, MD  04/30/2023 10:21 AM    [facility]

## 2023-04-30 NOTE — H&P (View-Only) (Signed)
Cardiology Office Note   Date:  04/30/2023   ID:  Stephen [name], DOB 1961-01-26, MRN [id number]  PCP:  Malva Limes, MD  Cardiologist:   Lorine Bears, MD   Chief Complaint  Patient presents with   New Patient (Initial Visit)    Ref by Dr. Sherrie Mustache to establish care for CHF. Patient c/o dizziness and mild headache with difficulties with extreme brightness.  Medications reviewed by the patient verbally.       History of Present Illness: Stephen [name] is a 62 y.o. male who presents for a follow-up visit regarding chronic systolic heart failure and persistent atrial fibrillation. He has known history of essential hypertension, hyperlipidemia, previous heavy alcohol use, chronic back pain and anxiety. He was hospitalized at [facility] in early July with heart failure in the setting of atrial fibrillation with rapid ventricular response.  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  He improved with rate control and diuresis.  He ultimately underwent successful TEE guided cardioversion and was placed on amiodarone.  He has been doing well since hospital discharge with significant improvement in heart failure symptoms.  He denies chest pain.  He is noted to be in atrial flutter today but he denies palpitations.  He has been taking his medications regularly and has not missed any dose of Eliquis or amiodarone. No orthopnea, PND or lower extremity edema.   Past Medical History:  Diagnosis Date   Hyperlipidemia    Hypertension     Past Surgical History:  Procedure Laterality Date   CARDIOVERSION N/A 03/06/2023   Procedure: CARDIOVERSION;  Surgeon: Antonieta Iba, MD;  Location: [facility] ORS;  Service: Cardiovascular;  Laterality: N/A;   COLONOSCOPY  10/31/2013   TEE WITHOUT CARDIOVERSION N/A 03/06/2023   Procedure: TRANSESOPHAGEAL ECHOCARDIOGRAM;  Surgeon: Antonieta Iba, MD;  Location: [facility] ORS;  Service:  Cardiovascular;  Laterality: N/A;   TONSILLECTOMY AND ADENOIDECTOMY       Current Outpatient Medications  Medication Sig Dispense Refill   amiodarone (PACERONE) 200 MG tablet Take 200 mg by mouth 2 (two) times daily.     apixaban (ELIQUIS) 5 MG TABS tablet Take 1 tablet (5 mg total) by mouth 2 (two) times daily. 60 tablet 3   aspirin 81 MG tablet Take 1 tablet by mouth daily.     atorvastatin (LIPITOR) 40 MG tablet TAKE ONE TABLET (40 MG TOTAL) BY MOUTH AT BEDTIME. 30 tablet 0   clonazePAM (KLONOPIN) 0.5 MG tablet TAKE 0.5-1 TABLETS (0.25-0.5 MG TOTAL) BY MOUTH TWO (TWO) TIMES DAILY AS NEEDED FOR ANXIETY. 30 tablet 2   digoxin (LANOXIN) 0.125 MG tablet TAKE ONE TABLET (0.125 MG TOTAL) BY MOUTH DAILY. 30 tablet 2   ENTRESTO 49-51 MG TAKE ONE TABLET BY MOUTH TWO (TWO) TIMES DAILY. 60 tablet 0   folic acid (FOLVITE) 1 MG tablet TAKE ONE TABLET (1 MG TOTAL) BY MOUTH DAILY. 30 tablet 0   furosemide (LASIX) 40 MG tablet TAKE ONE TABLET (40 MG TOTAL) BY MOUTH TWO (TWO) TIMES DAILY. 60 tablet 0   gabapentin (NEURONTIN) 600 MG tablet Take 1 tablet (600 mg total) by mouth 3 (three) times daily as needed. 90 tablet 3   metoprolol succinate (TOPROL-XL) 50 MG 24 hr tablet TAKE ONE TABLET (50 MG TOTAL) BY MOUTH DAILY. TAKE WITH OR IMMEDIATELY FOLLOWING A MEAL. 30 tablet 0   niacin 500 MG tablet Take 1 tablet by mouth daily.  OMEGA-3 FATTY ACIDS PO Take 1 capsule by mouth daily.     polyethylene glycol (MIRALAX / GLYCOLAX) 17 g packet Take 17 g by mouth daily as needed for mild constipation. 14 each 0   potassium chloride SA (KLOR-CON M) 20 MEQ tablet TAKE ONE TABLET (20 MEQ TOTAL) BY MOUTH TWO (TWO) TIMES DAILY. 60 tablet 0   thiamine (VITAMIN B-1) 100 MG tablet Take 1 tablet (100 mg total) by mouth daily. (Patient not taking: Reported on 04/30/2023) 30 tablet 0   No current facility-administered medications for this visit.    Allergies:   Patient has no known allergies.    Social History:  The  patient  reports that he has never smoked. He has never used smokeless tobacco. He reports current alcohol use of about 120.0 standard drinks of alcohol per week. He reports that he does not use drugs.   Family History:  The patient's family history includes COPD in his father; Congestive Heart Failure in his mother; HIV in his brother; Heart failure in his father; Hypertension in his mother.    ROS:  Please see the history of present illness.   Otherwise, review of systems are positive for none.   All other systems are reviewed and negative.    PHYSICAL EXAM: VS:  BP 120/68 (BP Location: Right Arm, Patient Position: Sitting, Cuff Size: Normal)   Pulse 90   Ht 6\' 2"  (1.88 m)   Wt 228 lb (103.4 kg)   SpO2 97%   BMI 29.27 kg/m  , BMI Body mass index is 29.27 kg/m. GEN: Well nourished, well developed, in no acute distress  HEENT: normal  Neck: no JVD, carotid bruits, or masses Cardiac: Irregularly irregular; no murmurs, rubs, or gallops,no edema  Respiratory:  clear to auscultation bilaterally, normal work of breathing GI: soft, nontender, nondistended, + BS MS: no deformity or atrophy  Skin: warm and dry, no rash Neuro:  Strength and sensation are intact Psych: euthymic mood, full affect   EKG:  EKG is ordered today. The ekg ordered today demonstrates : Atrial flutter with variable A-V block Left axis deviation Minimal voltage criteria for LVH, may be normal variant ( Cornell product ) Nonspecific T wave abnormality When compared with ECG of 06-Mar-2023 09:19, atrial flutter replaced sinus rhythm     Recent Labs: 03/02/2023: B Natriuretic Peptide 680.0 03/27/2023: ALT 43; BUN 13; Creatinine, Ser 1.16; Hemoglobin 16.8; Magnesium 2.4; Platelets 218; Potassium 4.5; Sodium 141; TSH 3.100    Lipid Panel    Component Value Date/Time   CHOL 236 (H) 11/19/2022 1000   TRIG 222 (H) 11/19/2022 1000   HDL 49 11/19/2022 1000   CHOLHDL 2.8 01/18/2020 0901   CHOLHDL 4.2 06/22/2017 1010    LDLCALC 147 (H) 11/19/2022 1000   LDLCALC 118 (H) 06/22/2017 1010   LDLDIRECT 84 03/27/2023 1015      Wt Readings from Last 3 Encounters:  04/30/23 228 lb (103.4 kg)  03/27/23 228 lb 3.2 oz (103.5 kg)  03/07/23 227 lb 11.8 oz (103.3 kg)           No data to display            ASSESSMENT AND PLAN:  1.  Persistent atrial fibrillation/flutter: His initial presentation and July was atrial fibrillation with RVR.  Unfortunately, he is in atypical atrial flutter today.  He has not missed any dose of Eliquis and continues to take amiodarone 200 mg once daily. I recommend proceeding with cardioversion.  I discussed the  procedure in details as well as risk and benefits. Considering his young age, amiodarone is not a good long-term option.  I am referring him to Dr. Lalla Brothers to discuss ablation.  2.  Chronic systolic heart failure: Severely reduced LV systolic function with an EF of 20 to 25% thought to be due to tachycardia induced cardiomyopathy.  There was mild to moderate mitral regurgitation. He appears to be euvolemic on furosemide 40 mg twice daily.  Continue heart failure medications including Toprol and Entresto.  The plan is to reevaluate his ejection fraction once he is maintaining in sinus rhythm.  If EF does not recover, recommend a right and left cardiac catheterization to exclude the possibility of ischemic cardiomyopathy.  3.  Essential hypertension: Blood pressure is controlled on current medications.  4.  Hyperlipidemia: Currently on atorvastatin 40 mg once daily.  This is a new patient to me and I spent 45 minutes reviewing his extensive cardiac history and recent admission.   Disposition:   Proceed with cardioversion next week.  Referred to Dr. Lalla Brothers to discuss ablation.  Follow-up with [place] in 3 months.  Signed,  Lorine Bears, MD  04/30/2023 10:21 AM    [facility]

## 2023-04-30 NOTE — Patient Instructions (Signed)
Medication Instructions:  Your Physician recommend you continue on your current medication as directed.    *If you need a refill on your cardiac medications before your next appointment, please call your pharmacy*   Lab Work: Your provider would like for you to have following labs drawn today CMET, Digoxin Level,TSH, CBC.   If you have labs (blood work) drawn today and your tests are completely normal, you will receive your results only by: MyChart Message (if you have MyChart) OR A paper copy in the mail If you have any lab test that is abnormal or we need to change your treatment, we will call you to review the results.   Testing/Procedures: You are scheduled for a Cardioversion on 3 Sept, 2024 with Dr. Mariah Milling.    Please arrive at the [facility] Entrance of [facility], [address], [place] [address] at 0630 am/pm (This is 1 hour prior to your procedure time).  Proceed to the Check-In Desk directly inside the entrance.  Procedure Parking: Use the entrance off of the [address] side of the hospital. Turn right upon entering and follow the driveway to parking that is directly in front of the Heart & Vascular Center. There is no valet parking available at this entrance, however there is an awning directly in front of the Heart & Vascular Center for drop off/ pick up for patients  DIET: Nothing to eat or drink after midnight except a sip of water with medications (see medication instructions below)  Medication Instructions: Hold Furosamide (Lasix) the day of procedure Continue your anticoagulant: Eliquis and Aspirin  If you miss a dose, please call us at [phone] You will need to continue your anticoagulant after your procedure until you are told by your provider that it is safe to stop.   Labs: will be done today  FYI: For your safety, and to allow [place] to monitor your vital signs accurately during the surgery/procedure we request that if you have artificial nails,  gel coating, SNS etc. Please have those removed prior to your surgery/procedure. Not having the nail coverings /polish removed may result in cancellation or delay of your surgery/procedure.  You must have a responsible person to drive you home and stay in the waiting area during your procedure. Failure to do so could result in cancellation.  Bring your insurance cards.  If you have any questions after you get home, please call the office at [phone]  *Special Note: Every effort is made to have your procedure done on time. Occasionally there are emergencies that occur at the hospital that may cause delays. Please be patient if a delay does occur.       Follow-Up: At [facility], you and your health needs are our priority.  As part of our continuing mission to provide you with exceptional heart care, we have created designated Provider Care Teams.  These Care Teams include your primary Cardiologist (physician) and Advanced Practice Providers (APPs -  Physician Assistants and Nurse Practitioners) who all work together to provide you with the care you need, when you need it.  We recommend signing up for the patient portal called "MyChart".  Sign up information is provided on this After Visit Summary.  MyChart is used to connect with patients for Virtual Visits (Telemedicine).  Patients are able to view lab/test results, encounter notes, upcoming appointments, etc.  Non-urgent messages can be sent to your provider as well.   To learn more about what you can do  with MyChart, go to [email or web address].    Your next appointment:   3 month(s)  Provider:   You may see Lorine Bears, MD or one of the following Advanced Practice Providers on your designated Care Team:   Other Instructions A Referral has been placed for you to follow up with Dr Steffanie Dunn.

## 2023-05-01 LAB — COMPREHENSIVE METABOLIC PANEL
ALT: 25 IU/L (ref 0–44)
AST: 17 IU/L (ref 0–40)
Albumin: 4.2 g/dL (ref 3.9–4.9)
Alkaline Phosphatase: 98 IU/L (ref 44–121)
BUN/Creatinine Ratio: 10 (ref 10–24)
BUN: 10 mg/dL (ref 8–27)
Bilirubin Total: 0.7 mg/dL (ref 0.0–1.2)
CO2: 24 mmol/L (ref 20–29)
Calcium: 9.1 mg/dL (ref 8.6–10.2)
Chloride: 106 mmol/L (ref 96–106)
Creatinine, Ser: 1.02 mg/dL (ref 0.76–1.27)
Globulin, Total: 2.6 g/dL (ref 1.5–4.5)
Glucose: 98 mg/dL (ref 70–99)
Potassium: 4 mmol/L (ref 3.5–5.2)
Sodium: 144 mmol/L (ref 134–144)
Total Protein: 6.8 g/dL (ref 6.0–8.5)
eGFR: 84 mL/min/{1.73_m2} (ref >59)

## 2023-05-01 LAB — CBC
Hematocrit: 45.1 % (ref 37.5–51.0)
Hemoglobin: 15.4 g/dL (ref 13.0–17.7)
MCH: 27.7 pg (ref 26.6–33.0)
MCHC: 34.1 g/dL (ref 31.5–35.7)
MCV: 81 fL (ref 79–97)
Platelets: 178 10*3/uL (ref 150–450)
RBC: 5.55 x10E6/uL (ref 4.14–5.80)
RDW: 14 % (ref 11.6–15.4)
WBC: 5 10*3/uL (ref 3.4–10.8)

## 2023-05-01 LAB — DIGOXIN LEVEL: Digoxin, Serum: 1 ng/mL — ABNORMAL HIGH (ref 0.5–0.9)

## 2023-05-01 LAB — TSH: TSH: 2.93 u[IU]/mL (ref 0.450–4.500)

## 2023-05-03 DIAGNOSIS — Z419 Encounter for procedure for purposes other than remedying health state, unspecified: Secondary | ICD-10-CM | POA: Diagnosis not present

## 2023-05-05 ENCOUNTER — Encounter
Admission: RE | Disposition: A | Discharge status: Home / Self Care | Payer: Self-pay | Source: Home / Self Care | Attending: Cardiovascular Disease

## 2023-05-05 ENCOUNTER — Ambulatory Visit: Payer: Medicaid Other | Admitting: General Practice

## 2023-05-05 ENCOUNTER — Other Ambulatory Visit: Payer: Self-pay

## 2023-05-05 ENCOUNTER — Encounter: Payer: Self-pay | Admitting: Cardiovascular Disease

## 2023-05-05 ENCOUNTER — Ambulatory Visit
Admission: RE | Admit: 2023-05-05 | Discharge: 2023-05-05 | Disposition: A | Discharge status: Home / Self Care | Payer: Medicaid Other | Attending: Cardiovascular Disease | Admitting: Cardiovascular Disease

## 2023-05-05 DIAGNOSIS — I4819 Other persistent atrial fibrillation: Secondary | ICD-10-CM | POA: Diagnosis not present

## 2023-05-05 DIAGNOSIS — I483 Typical atrial flutter: Secondary | ICD-10-CM

## 2023-05-05 DIAGNOSIS — E785 Hyperlipidemia, unspecified: Secondary | ICD-10-CM | POA: Insufficient documentation

## 2023-05-05 DIAGNOSIS — I420 Dilated cardiomyopathy: Secondary | ICD-10-CM | POA: Diagnosis not present

## 2023-05-05 DIAGNOSIS — Z79899 Other long term (current) drug therapy: Secondary | ICD-10-CM | POA: Diagnosis not present

## 2023-05-05 DIAGNOSIS — I110 Hypertensive heart disease with heart failure: Secondary | ICD-10-CM | POA: Diagnosis not present

## 2023-05-05 DIAGNOSIS — I4891 Unspecified atrial fibrillation: Secondary | ICD-10-CM | POA: Diagnosis not present

## 2023-05-05 DIAGNOSIS — F419 Anxiety disorder, unspecified: Secondary | ICD-10-CM | POA: Diagnosis not present

## 2023-05-05 DIAGNOSIS — I484 Atypical atrial flutter: Secondary | ICD-10-CM | POA: Insufficient documentation

## 2023-05-05 DIAGNOSIS — Z7901 Long term (current) use of anticoagulants: Secondary | ICD-10-CM | POA: Diagnosis not present

## 2023-05-05 DIAGNOSIS — I5022 Chronic systolic (congestive) heart failure: Secondary | ICD-10-CM | POA: Insufficient documentation

## 2023-05-05 DIAGNOSIS — R0602 Shortness of breath: Secondary | ICD-10-CM

## 2023-05-05 DIAGNOSIS — I4892 Unspecified atrial flutter: Secondary | ICD-10-CM | POA: Diagnosis present

## 2023-05-05 HISTORY — PX: CARDIOVERSION: SHX1299

## 2023-05-05 SURGERY — CARDIOVERSION
Anesthesia: General

## 2023-05-05 MED ORDER — PROPOFOL 10 MG/ML IV BOLUS
INTRAVENOUS | Status: DC | PRN
  Administered 2023-05-05 (×3): 30 mg via INTRAVENOUS
  Administered 2023-05-05: 20 mg via INTRAVENOUS

## 2023-05-05 MED ORDER — SODIUM CHLORIDE 0.9 % IV SOLN: INTRAVENOUS | Status: DC

## 2023-05-05 NOTE — Transfer of Care (Signed)
Immediate Anesthesia Transfer of Care Note  Patient: Stephen [name]  Procedure(s) Performed: CARDIOVERSION  Patient Location: Cath Lab  Anesthesia Type:General  Level of Consciousness: awake, alert , and oriented  Airway & Oxygen Therapy: Patient Spontanous Breathing  Post-op Assessment: Report given to RN and Post -op Vital signs reviewed and stable  Post vital signs: Reviewed and stable  Last Vitals:  Vitals Value Taken Time  BP 107/76 05/05/23 0739  Temp    Pulse 62 05/05/23 0742  Resp 14 05/05/23 0742  SpO2 98 % 05/05/23 0742    Last Pain:  Vitals:   05/05/23 0707  TempSrc: Oral  PainSc: 0-No pain         Complications: No notable events documented.

## 2023-05-05 NOTE — Anesthesia Preprocedure Evaluation (Signed)
Anesthesia Evaluation  Patient identified by MRN, date of birth, ID band Patient awake  General Assessment Comment:  Patient presented with acute decompensated heart failure. Found to have EF 20%, and afib with RVR. Patient does not see a doctor regularly, had not known he had heart issues like this.  Reviewed: Allergy & Precautions, NPO status , Patient's Chart, lab work & pertinent test results  History of Anesthesia Complications Negative for: history of anesthetic complications  Airway Mallampati: III  TM Distance: >3 FB Neck ROM: Full    Dental  (+) Teeth Intact, Chipped, Dental Advidsory Given   Pulmonary neg pulmonary ROS, neg sleep apnea, neg COPD, Patient abstained from smoking.Not current smoker   Pulmonary exam normal breath sounds clear to auscultation       Cardiovascular Exercise Tolerance: Poor METShypertension, +CHF  (-) CAD and (-) Past MI + dysrhythmias Atrial Fibrillation + Valvular Problems/Murmurs MR  Rhythm:Irregular Rate:Tachycardia - Systolic murmurs Appears well and euvolemic on exam today.  1. Left ventricular ejection fraction, by estimation, is 20 to 25%. The  left ventricle has severely decreased function. The left ventricle  demonstrates regional wall motion abnormalities (see scoring  diagram/findings for description). The left  ventricular internal cavity size was moderately to severely dilated. There  is moderate left ventricular hypertrophy. Left ventricular diastolic  parameters are indeterminate.   2. Right ventricular systolic function is mildly reduced. The right  ventricular size is moderately enlarged.   3. Left atrial size was mildly dilated.   4. Right atrial size was severely dilated.   5. The mitral valve is normal in structure. Mild to moderate mitral valve  regurgitation.   6. The aortic valve has an indeterminant number of cusps. There is mild  thickening of the aortic valve.  Aortic valve regurgitation is not  visualized. Aortic valve sclerosis is present, with no evidence of aortic  valve stenosis.     Neuro/Psych negative neurological ROS  negative psych ROS   GI/Hepatic ,neg GERD  ,,(+)     substance abuse  alcohol useLongtime alcohol use disorder, drinking a case of beer a day up until 2 year ago, when he has cut down to 6-12 beers a week.   Endo/Other  neg diabetes    Renal/GU negative Renal ROS     Musculoskeletal   Abdominal   Peds  Hematology   Anesthesia Other Findings Past Medical History: No date: Hyperlipidemia No date: Hypertension  Reproductive/Obstetrics                             Anesthesia Physical Anesthesia Plan  ASA: 3  Anesthesia Plan: General   Post-op Pain Management: Minimal or no pain anticipated   Induction: Intravenous  PONV Risk Score and Plan: 3 and Propofol infusion, TIVA and Ondansetron  Airway Management Planned: Nasal Cannula  Additional Equipment: None  Intra-op Plan:   Post-operative Plan:   Informed Consent: I have reviewed the patients History and Physical, chart, labs and discussed the procedure including the risks, benefits and alternatives for the proposed anesthesia with the patient or authorized representative who has indicated his/her understanding and acceptance.     Dental advisory given  Plan Discussed with: CRNA and Surgeon  Anesthesia Plan Comments: (Discussed risks of anesthesia with patient, including possibility of difficulty with spontaneous ventilation under anesthesia necessitating airway intervention, PONV, and rare risks such as cardiac or respiratory or neurological events, and allergic reactions. Discussed the role of CRNA  in patient's perioperative care. Patient understands.)       Anesthesia Quick Evaluation

## 2023-05-05 NOTE — CV Procedure (Signed)
Cardioversion procedure note For atrial flutter  Procedure Details:  Consent: Risks of procedure as well as the alternatives and risks of each were explained to the (patient/caregiver).  Consent for procedure obtained.  Time Out: Verified patient identification, verified procedure, site/side was marked, verified correct patient position, special equipment/implants available, medications/allergies/relevent history reviewed, required imaging and test results available.  Performed  Patient placed on cardiac monitor, pulse oximetry, supplemental oxygen as necessary.   Sedation given: propofol IV, Dr. Lorette Ang Pacer pads placed anterior and posterior chest.   Cardioverted 1 time(s).   Cardioverted at  120 J. Synchronized biphasic Converted to NSR   Evaluation: Findings: Post procedure EKG shows: NSR Complications: None Patient did tolerate procedure well.  Time Spent Directly with the Patient:  45 minutes   Dossie Arbour, M.D., Ph.D.

## 2023-05-06 ENCOUNTER — Encounter: Payer: Self-pay | Admitting: Cardiovascular Disease

## 2023-05-06 ENCOUNTER — Other Ambulatory Visit: Payer: Self-pay | Admitting: Family Medicine

## 2023-05-06 DIAGNOSIS — M545 Low back pain, unspecified: Secondary | ICD-10-CM

## 2023-05-06 DIAGNOSIS — F439 Reaction to severe stress, unspecified: Secondary | ICD-10-CM

## 2023-05-06 NOTE — Telephone Encounter (Signed)
Medication Refill - Medication: atorvastatin (LIPITOR) 40 MG tablet  clonazePAM (KLONOPIN) 0.5 MG tablet  ENTRESTO 49-51 MG  folic acid (FOLVITE) 1 MG tablet  furosemide (LASIX) 40 MG tablet  gabapentin (NEURONTIN) 600 MG tablet  metoprolol succinate (TOPROL-XL) 50 MG 24 hr tablet  potassium chloride SA (KLOR-CON M) 20 MEQ tablet  thiamine (VITAMIN B-1) 100 MG tablet  amiodarone (PACERONE) 200 MG tablet   Has the patient contacted their pharmacy? Yes.   (Agent: If no, request that the patient contact the pharmacy for the refill. If patient does not wish to contact the pharmacy document the reason why and proceed with request.) (Agent: If yes, when and what did the pharmacy advise?)  Preferred Pharmacy (with phone number or street name):  [facility] - [place], [place] - [address]  [address] [place] [address]  Phone: [phone] Fax: [phone]   Has the patient been seen for an appointment in the last year OR does the patient have an upcoming appointment? Yes.    Agent: Please be advised that RX refills may take up to 3 business days. We ask that you follow-up with your pharmacy.

## 2023-05-06 NOTE — Anesthesia Postprocedure Evaluation (Signed)
Anesthesia Post Note  Patient: Stephen [name]  Procedure(s) Performed: CARDIOVERSION  Patient location during evaluation: PACU Anesthesia Type: General Level of consciousness: awake and alert Pain management: pain level controlled Vital Signs Assessment: post-procedure vital signs reviewed and stable Respiratory status: spontaneous breathing, nonlabored ventilation, respiratory function stable and patient connected to nasal cannula oxygen Cardiovascular status: blood pressure returned to baseline and stable Postop Assessment: no apparent nausea or vomiting Anesthetic complications: no   No notable events documented.   Last Vitals:  Vitals:   05/05/23 0830 05/05/23 0845  BP: 132/79 133/84  Pulse: 65 63  Resp: (!) 21 19  Temp:    SpO2: 97% 96%    Last Pain:  Vitals:   05/05/23 0845  TempSrc:   PainSc: 0-No pain                 Stephanie Coup

## 2023-05-07 ENCOUNTER — Other Ambulatory Visit: Payer: Self-pay | Admitting: Family Medicine

## 2023-05-07 MED ORDER — GABAPENTIN 600 MG PO TABS: 600.0000 mg | ORAL_TABLET | Freq: Three times a day (TID) | ORAL | 3 refills | Status: DC | PRN

## 2023-05-07 MED ORDER — FOLIC ACID 1 MG PO TABS: 1.0000 mg | ORAL_TABLET | Freq: Every day | ORAL | 0 refills | Status: DC

## 2023-05-07 MED ORDER — ATORVASTATIN CALCIUM 40 MG PO TABS: 40.0000 mg | ORAL_TABLET | Freq: Every day | ORAL | 2 refills | Status: DC

## 2023-05-07 MED ORDER — METOPROLOL SUCCINATE ER 50 MG PO TB24: 50.0000 mg | ORAL_TABLET | Freq: Every day | ORAL | 0 refills | Status: DC

## 2023-05-07 MED ORDER — POTASSIUM CHLORIDE CRYS ER 20 MEQ PO TBCR: 20.0000 meq | EXTENDED_RELEASE_TABLET | Freq: Two times a day (BID) | ORAL | 0 refills | Status: DC

## 2023-05-07 MED ORDER — FUROSEMIDE 40 MG PO TABS: 40.0000 mg | ORAL_TABLET | Freq: Two times a day (BID) | ORAL | 0 refills | Status: DC

## 2023-05-07 NOTE — Telephone Encounter (Signed)
Requested medication (s) are due for refill today: yes  Requested medication (s) are on the active medication list: yes  Last refill:  amlodipine: 04/30/23             thiamin: 03/07/23 #30   Entresto: 04/09/23 #60  clonazepam: 01/22/23 #30 2 RF Future visit scheduled: no  Notes to clinic:  amlodipine: 04/30/23 historical med and prescriber     Clonazepam: med not delegated to NT to RF Thiamin: prescriber not at this practice (prescribed at hospital d/c Dr. Sabino Dick protocol) Sherryll Burger: 04/09/23 (off protocol)       Requested Prescriptions  Pending Prescriptions Disp Refills   amiodarone (PACERONE) 200 MG tablet      Sig: Take 1 tablet (200 mg total) by mouth 2 (two) times daily.     Not Delegated - Cardiovascular: Antiarrhythmic Agents - amiodarone Failed - 05/06/2023  4:10 PM      Failed - This refill cannot be delegated      Failed - Manual Review: Eye exam recommended every 12 months      Failed - Mg Level in normal range and within 360 days    Magnesium  Date Value Ref Range Status  03/27/2023 2.4 (H) 1.6 - 2.3 mg/dL Final         Passed - TSH in normal range and within 360 days    TSH  Date Value Ref Range Status  04/30/2023 2.930 0.450 - 4.500 uIU/mL Final         Passed - K in normal range and within 180 days    Potassium  Date Value Ref Range Status  04/30/2023 4.0 3.5 - 5.2 mmol/L Final         Passed - AST in normal range and within 180 days    AST  Date Value Ref Range Status  04/30/2023 17 0 - 40 IU/L Final         Passed - ALT in normal range and within 180 days    ALT  Date Value Ref Range Status  04/30/2023 25 0 - 44 IU/L Final         Passed - Patient had ECG in the last 180 days      Passed - Patient is not pregnant      Passed - Last BP in normal range    BP Readings from Last 1 Encounters:  05/05/23 133/84         Passed - Last Heart Rate in normal range    Pulse Readings from Last 1 Encounters:  05/05/23 63         Passed - Valid  encounter within last 6 months    Recent Outpatient Visits           1 month ago New onset atrial fibrillation (HCC)   [place] [facility]   4 months ago Primary hypertension   [place] [facility]   5 months ago Mixed hyperlipidemia   [facility], MD   3 years ago Chronic midline low back pain without sciatica   [facility], MD   3 years ago Chronic bilateral low back pain without sciatica    [facility]       Future Appointments             In 2 months Iran Ouch, MD  [place] [facility] - Patient had chest x-ray within the last 6 months       clonazePAM (KLONOPIN) 0.5 MG tablet 30 tablet 2     Not Delegated - Psychiatry: Anxiolytics/Hypnotics 2 Failed - 05/06/2023  4:10 PM      Failed - This refill cannot be delegated      Failed - Urine Drug Screen completed in last 360 days      Passed - Patient is not pregnant      Passed - Valid encounter within last 6 months    Recent Outpatient Visits           1 month ago New onset atrial fibrillation (HCC)   [facility]   4 months ago Primary hypertension   [place] [facility], [facility]   5 months ago Mixed hyperlipidemia   [place], MD   3 years ago Chronic midline low back pain without sciatica   [place] [facility] Malva [facility], MD   3 years ago Chronic bilateral low back pain without sciatica   [place] Mal[facility], MD       Future Appointments             In 2 months Kirke Corin, Chelsea Aus, MD [place] [facility]             thiamine (VITAMIN B-1) 100 MG tablet 30  tablet     Sig: Take 1 tablet (100 mg total) by mouth daily.     Off-Protocol Failed - 05/06/2023  4:10 PM      Failed - Medication not assigned to a protocol, review manually.      Passed - Valid encounter within last 12 months    Recent Outpatient Visits           1 month ago New onset atrial fibrillation [facility])   [facility]   4 months ago Primary hypertension   [place]   5 months ago Mixed hyperlipidemia   [place] [facility] Mal[facility], MD   3 years ago Chronic midline low back pain without sciatica   [place] [facility]   3 years ago Chronic bilateral low back pain without sciatica   [place] [facility], MD       Future Appointments             In 2 months Iran Ouch, MD [place] [facility]             sacubitril-valsartan [facility]) 49-51 MG 60 tablet 0     Off-Protocol Failed - 05/06/2023  4:10 PM      Failed - Medication not assigned to a protocol, review manually.      Passed - Valid encounter within last 12 months    Recent Outpatient Visits           1 month ago New onset atrial fibrillation [facility])   [place] [facility] Malva Limes, MD   4 months ago Primary hypertension   [place] [facility], MD   5 months ago Mixed hyperlipidemia  [facility] MD   3 years ago Chronic midline low back pain without sciatica   [place] [facility] Malva Limes, MD   3 years ago Chronic bilateral low back pain without sciatica   [place] [facility], MD       Future Appointments             In 2 months Kirke Corin, Chelsea Aus, MD [place] [facility]            Signed  Prescriptions Disp Refills   atorvastatin (LIPITOR) 40 MG tablet 90 tablet 2    Sig: Take 1 tablet (40 mg total) by mouth daily. TAKE ONE TABLET (40 MG TOTAL) BY MOUTH AT BEDTIME.     Cardiovascular:  Antilipid - Statins Failed - 05/06/2023  4:10 PM      Failed - Lipid Panel in normal range within the last 12 months    Cholesterol, Total  Date Value Ref Range Status  11/19/2022 236 (H) 100 - 199 mg/dL Final   LDL Cholesterol (Calc)  Date Value Ref Range Status  06/22/2017 118 (H) mg/dL (calc) Final    Comment:    Reference range: <100 . Desirable range <100 mg/dL for primary prevention;   <70 mg/dL for patients with CHD or diabetic patients  with > or = 2 CHD risk factors. Marland Kitchen LDL-C is now calculated using the Martin-Hopkins  calculation, which is a validated novel method providing  better accuracy than the Friedewald equation in the  estimation of LDL-C.  Horald Pollen et al. Lenox Ahr. 6295;284(13): 2061-2068  (http://education.[employer].com/faq/FAQ164)    LDL Chol Calc (NIH)  Date Value Ref Range Status  11/19/2022 147 (H) 0 - 99 mg/dL Final   LDL Direct  Date Value Ref Range Status  03/27/2023 84 0 - 99 mg/dL Final   HDL  Date Value Ref Range Status  11/19/2022 49 >39 mg/dL Final   Triglycerides  Date Value Ref Range Status  11/19/2022 222 (H) 0 - 149 mg/dL Final         Passed - Patient is not pregnant      Passed - Valid encounter within last 12 months    Recent Outpatient Visits           1 month ago New onset atrial fibrillation (HCC)   [facility]   4 months ago Primary hypertension   [place] [facility], MD   5 months ago Mixed hyperlipidemia   [place] [facility] Mal[facility] L[facility], MD   3 years ago Chronic midline low back pain without sciatica   [place] Malva L[facility], MD   3 years ago Chronic bilateral low back pain  without sciatica   [place] [facility] Malva Limes, MD       Future Appointments             In 2 months Iran Ouch, MD [place] [facility]             folic acid (FOLVITE) 1 MG tablet 30 tablet 0    Sig: Take 1 tablet (1 mg total) by mouth daily.     Endocrinology:  Vitamins Passed - 05/06/2023  4:10 PM      Passed - Valid encounter within last 12 months    Recent Outpatient Visits  1 month ago New onset atrial fibrillation [facility])   [place] [facility] Mal[facility], MD   4 months ago Primary hypertension   [place] [facility] Malva Limes, MD   5 months ago Mixed hyperlipidemia   [place] [facility] Mal[facility] L[facility], MD   3 years ago Chronic midline low back pain without sciatica   [place] [facility] Mal[facility] L[facility], MD   3 years ago Chronic bilateral low back pain without sciatica   [facility] Mal[facility], MD       Future Appointments             In 2 months Kirke Corin, Chelsea Aus, MD [place] [facility]             furosemide (LASIX) 40 MG tablet 60 tablet 0    Sig: Take 1 tablet (40 mg total) by mouth 2 (two) times daily. TAKE ONE TABLET (40 MG TOTAL) BY MOUTH TWO (TWO) TIMES DAILY.     Cardiovascular:  Diuretics - Loop Failed - 05/06/2023  4:10 PM      Failed - Mg Level in normal range and within 180 days    Magnesium  Date Value Ref Range Status  03/27/2023 2.4 (H) 1.6 - 2.3 mg/dL Final         Passed - K in normal range and within 180 days    Potassium  Date Value Ref Range Status  04/30/2023 4.0 3.5 - 5.2 mmol/L Final         Passed - Ca in normal range and within 180 days    Calcium  Date Value Ref Range Status  04/30/2023 9.1 8.6 - 10.2 mg/dL Final         Passed - Na in normal range and within 180 days    Sodium  Date Value Ref Range Status  04/30/2023 144  134 - 144 mmol/L Final         Passed - Cr in normal range and within 180 days    Creat  Date Value Ref Range Status  06/22/2017 0.79 0.70 - 1.33 mg/dL Final    Comment:    For patients >11 years of age, the reference limit for Creatinine is approximately 13% higher for people identified as African-American. .    Creatinine, Ser  Date Value Ref Range Status  04/30/2023 1.02 0.76 - 1.27 mg/dL Final         Passed - Cl in normal range and within 180 days    Chloride  Date Value Ref Range Status  04/30/2023 106 96 - 106 mmol/L Final         Passed - Last BP in normal range    BP Readings from Last 1 Encounters:  05/05/23 133/84         Passed - Valid encounter within last 6 months    Recent Outpatient Visits           1 month ago New onset atrial fibrillation [facility])   [place]   4 months ago Primary hypertension   [place] [facility], MD   5 months ago Mixed hyperlipidemia   [facility], MD   3 years ago Chronic midline low back pain without sciatica   [facility] MD   3 years  ago Chronic bilateral low back pain without sciatica   [place] Mal[facility], MD       Future Appointments             In 2 months Kirke Corin, Chelsea Aus, MD [place] [facility]             gabapentin (NEURONTIN) 600 MG tablet 90 tablet 3    Sig: Take 1 tablet (600 mg total) by mouth 3 (three) times daily as needed.     Neurology: Anticonvulsants - gabapentin Passed - 05/06/2023  4:10 PM      Passed - Cr in normal range and within 360 days    Creat  Date Value Ref Range Status  06/22/2017 0.79 0.70 - 1.33 mg/dL Final    Comment:    For patients >75 years of age, the reference limit for Creatinine is approximately 13% higher for people identified as  African-American. .    Creatinine, Ser  Date Value Ref Range Status  04/30/2023 1.02 0.76 - 1.27 mg/dL Final         Passed - Completed PHQ-2 or PHQ-9 in the last 360 days      Passed - Valid encounter within last 12 months    Recent Outpatient Visits           1 month ago New onset atrial fibrillation [facility])   [place] [facility]   4 months ago Primary hypertension   [place] [facility], [facility]   5 months ago Mixed hyperlipidemia   [place] [facility], MD   3 years ago Chronic midline low back pain without sciatica   [place] [facility] Malva L[facility], MD   3 years ago Chronic bilateral low back pain without sciatica   [place] [facility] Malva Limes, MD       Future Appointments             In 2 months Iran Ouch, MD [place] [facility]             metoprolol succinate (TOPROL-XL) 50 MG 24 hr tablet 30 tablet 0    Sig: Take 1 tablet (50 mg total) by mouth daily. Take with or immediately following a meal.     Cardiovascular:  Beta Blockers Passed - 05/06/2023  4:10 PM      Passed - Last BP in normal range    BP Readings from Last 1 Encounters:  05/05/23 133/84         Passed - Last Heart Rate in normal range    Pulse Readings from Last 1 Encounters:  05/05/23 63         Passed - Valid encounter within last 6 months    Recent Outpatient Visits           1 month ago New onset atrial fibrillation [facility])   [place] [facility], MD   4 months ago Primary hypertension   [place] [facility]   5 months ago Mixed hyperlipidemia   [facility] Mal[facility], MD   3 years ago Chronic midline low back pain without sciatica   [facility], MD   3  years ago Chronic bilateral low back pain without sciatica   [facility], MD       Future Appointments             In 2 months Iran Ouch, MD [facility]             potassium chloride SA (KLOR-CON M) 20 MEQ tablet 60 tablet 0    Sig: Take 1 tablet (20 mEq total) by mouth 2 (two) times daily.     Endocrinology:  Minerals - Potassium Supplementation Passed - 05/06/2023  4:10 PM      Passed - K in normal range and within 360 days    Potassium  Date Value Ref Range Status  04/30/2023 4.0 3.5 - 5.2 mmol/L Final         Passed - Cr in normal range and within 360 days    Creat  Date Value Ref Range Status  06/22/2017 0.79 0.70 - 1.33 mg/dL Final    Comment:    For patients >75 years of age, the reference limit for Creatinine is approximately 13% higher for people identified as African-American. .    Creatinine, Ser  Date Value Ref Range Status  04/30/2023 1.02 0.76 - 1.27 mg/dL Final         Passed - Valid encounter within last 12 months    Recent Outpatient Visits           1 month ago New onset atrial fibrillation [facility])   [place] [facility] Mal[facility], MD   4 months ago Primary hypertension   [place] [facility] MD   5 months ago Mixed hyperlipidemia   [place] [facility], MD   3 years ago Chronic midline low back pain without sciatica   [place] [facility] MD   3 years ago Chronic bilateral low back pain without sciatica   [facility] Mal[facility] Limes, MD       Future Appointments             In 2 months Kirke Corin, Chelsea Aus, MD [facility]

## 2023-05-07 NOTE — Telephone Encounter (Signed)
Requested Prescriptions  Pending Prescriptions Disp Refills   amiodarone (PACERONE) 200 MG tablet      Sig: Take 1 tablet (200 mg total) by mouth 2 (two) times daily.     Not Delegated - Cardiovascular: Antiarrhythmic Agents - amiodarone Failed - 05/06/2023  4:10 PM      Failed - This refill cannot be delegated      Failed - Manual Review: Eye exam recommended every 12 months      Failed - Mg Level in normal range and within 360 days    Magnesium  Date Value Ref Range Status  03/27/2023 2.4 (H) 1.6 - 2.3 mg/dL Final         Passed - TSH in normal range and within 360 days    TSH  Date Value Ref Range Status  04/30/2023 2.930 0.450 - 4.500 uIU/mL Final         Passed - K in normal range and within 180 days    Potassium  Date Value Ref Range Status  04/30/2023 4.0 3.5 - 5.2 mmol/L Final         Passed - AST in normal range and within 180 days    AST  Date Value Ref Range Status  04/30/2023 17 0 - 40 IU/L Final         Passed - ALT in normal range and within 180 days    ALT  Date Value Ref Range Status  04/30/2023 25 0 - 44 IU/L Final         Passed - Patient had ECG in the last 180 days      Passed - Patient is not pregnant      Passed - Last BP in normal range    BP Readings from Last 1 Encounters:  05/05/23 133/84         Passed - Last Heart Rate in normal range    Pulse Readings from Last 1 Encounters:  05/05/23 63         Passed - Valid encounter within last 6 months    Recent Outpatient Visits           1 month ago New onset atrial fibrillation (HCC)   [place]imes, MD   4 months ago Primary hypertension   [place] [facility], [facility]   5 months ago Mixed hyperlipidemia   [place] [facility] Malva Limes, MD   3 years ago Chronic midline low back pain without sciatica   [place] Mal[facility] Limes, MD   3 years ago Chronic  bilateral low back pain without sciatica   [place] [facility] Malva Limes, MD       Future Appointments             In 2 months Kirke Corin, Chelsea Aus, MD [place] [facility] - Patient had chest x-ray within the last 6 months       atorvastatin (LIPITOR) 40 MG tablet 90 tablet 2    Sig: Take 1 tablet (40 mg total) by mouth daily. TAKE ONE TABLET (40 MG TOTAL) BY MOUTH AT BEDTIME.     Cardiovascular:  Antilipid - Statins Failed - 05/06/2023  4:10 PM      Failed - Lipid Panel in normal range within the last 12 months    Cholesterol, Total  Date Value Ref Range Status  11/19/2022 236 (H) 100 - 199 mg/dL Final   LDL Cholesterol (Calc)  Date Value Ref Range Status  06/22/2017 118 (H) mg/dL (calc) Final    Comment:    Reference range: <100 . Desirable range <100 mg/dL for primary prevention;   <70 mg/dL for patients with CHD or diabetic patients  with > or = 2 CHD risk factors. Marland Kitchen LDL-C is now calculated using the Martin-Hopkins  calculation, which is a validated novel method providing  better accuracy than the Friedewald equation in the  estimation of LDL-C.  Horald Pollen et al. Lenox Ahr. 4034;742(59): 2061-2068  (http://education.[employer].com/faq/FAQ164)    LDL Chol Calc (NIH)  Date Value Ref Range Status  11/19/2022 147 (H) 0 - 99 mg/dL Final   LDL Direct  Date Value Ref Range Status  03/27/2023 84 0 - 99 mg/dL Final   HDL  Date Value Ref Range Status  11/19/2022 49 >39 mg/dL Final   Triglycerides  Date Value Ref Range Status  11/19/2022 222 (H) 0 - 149 mg/dL Final         Passed - Patient is not pregnant      Passed - Valid encounter within last 12 months    Recent Outpatient Visits           1 month ago New onset atrial fibrillation (HCC)   [place] [facility] Sherrie Mustache, Demetrios Isaacs, MD   4 months ago Primary hypertension   [place] [facility]    5 months ago Mixed hyperlipidemia   [facility], MD   3 years ago Chronic midline low back pain without sciatica   [facility] Malva Limes, MD   3 years ago Chronic bilateral low back pain without sciatica   [place] [facility] Malva Limes, MD       Future Appointments             In 2 months Kirke Corin, Chelsea Aus, MD [place] [facility]             clonazePAM [facility]) 0.5 MG tablet 30 tablet 2     Not Delegated - Psychiatry: Anxiolytics/Hypnotics 2 Failed - 05/06/2023  4:10 PM      Failed - This refill cannot be delegated      Failed - Urine Drug Screen completed in last 360 days      Passed - Patient is not pregnant      Passed - Valid encounter within last 6 months    Recent Outpatient Visits           1 month ago New onset atrial fibrillation [facility])   [place] [facility], MD   4 months ago Primary hypertension   [place] [facility], MD   5 months ago Mixed hyperlipidemia   [place] [facility] Malva Limes, MD   3 years ago Chronic midline low back pain without sciatica    [facility], MD   3 years ago Chronic bilateral low back pain without sciatica   [facility], MD       Future Appointments             In 2 months Kirke Corin, Chelsea Aus, MD [facility]  folic acid (FOLVITE) 1 MG tablet 30 tablet 0    Sig: Take 1 tablet (1 mg total) by mouth daily.     Endocrinology:  Vitamins Passed - 05/06/2023  4:10 PM      Passed - Valid encounter within last 12 months    Recent Outpatient Visits           1 month ago New onset atrial fibrillation (HCC)   [place] [facility] Mal[facility] L[facility], MD   4 months ago Primary  hypertension   [place] [facility] Malva Limes, MD   5 months ago Mixed hyperlipidemia   [facility], MD   3 years ago Chronic midline low back pain without sciatica   [place] [facility] Mal[facility], MD   3 years ago Chronic bilateral low back pain without sciatica   [employer] [facility], MD       Future Appointments             In 2 months Kirke Corin, Chelsea Aus, MD [place] [facility]             furosemide (LASIX) 40 MG tablet 60 tablet 0    Sig: Take 1 tablet (40 mg total) by mouth 2 (two) times daily. TAKE ONE TABLET (40 MG TOTAL) BY MOUTH TWO (TWO) TIMES DAILY.     Cardiovascular:  Diuretics - Loop Failed - 05/06/2023  4:10 PM      Failed - Mg Level in normal range and within 180 days    Magnesium  Date Value Ref Range Status  03/27/2023 2.4 (H) 1.6 - 2.3 mg/dL Final         Passed - K in normal range and within 180 days    Potassium  Date Value Ref Range Status  04/30/2023 4.0 3.5 - 5.2 mmol/L Final         Passed - Ca in normal range and within 180 days    Calcium  Date Value Ref Range Status  04/30/2023 9.1 8.6 - 10.2 mg/dL Final         Passed - Na in normal range and within 180 days    Sodium  Date Value Ref Range Status  04/30/2023 144 134 - 144 mmol/L Final         Passed - Cr in normal range and within 180 days    Creat  Date Value Ref Range Status  06/22/2017 0.79 0.70 - 1.33 mg/dL Final    Comment:    For patients >27 years of age, the reference limit for Creatinine is approximately 13% higher for people identified as African-American. .    Creatinine, Ser  Date Value Ref Range Status  04/30/2023 1.02 0.76 - 1.27 mg/dL Final         Passed - Cl in normal range and within 180 days    Chloride  Date Value Ref Range Status  04/30/2023 106 96 - 106 mmol/L Final         Passed - Last BP in normal  range    BP Readings from Last 1 Encounters:  05/05/23 133/84         Passed - Valid encounter within last 6 months    Recent Outpatient Visits           1 month ago New onset atrial fibrillation [facility])   [facility] Mila Merry  E, MD   4 months ago Primary hypertension   [place] [facility] Mal[facility] Limes, MD   5 months ago Mixed hyperlipidemia   [facility], MD   3 years ago Chronic midline low back pain without sciatica   [place] [facility] Mal[facility] L[facility], MD   3 years ago Chronic bilateral low back pain without sciatica   [place] [facility] Malva Limes, MD       Future Appointments             In 2 months Kirke Corin, Chelsea Aus, MD [place] [facility]             gabapentin (NEURONTIN) 600 MG tablet 90 tablet 3    Sig: Take 1 tablet (600 mg total) by mouth 3 (three) times daily as needed.     Neurology: Anticonvulsants - gabapentin Passed - 05/06/2023  4:10 PM      Passed - Cr in normal range and within 360 days    Creat  Date Value Ref Range Status  06/22/2017 0.79 0.70 - 1.33 mg/dL Final    Comment:    For patients >87 years of age, the reference limit for Creatinine is approximately 13% higher for people identified as African-American. .    Creatinine, Ser  Date Value Ref Range Status  04/30/2023 1.02 0.76 - 1.27 mg/dL Final         Passed - Completed PHQ-2 or PHQ-9 in the last 360 days      Passed - Valid encounter within last 12 months    Recent Outpatient Visits           1 month ago New onset atrial fibrillation [facility]   [place] [facility], [facility]   4 months ago Primary hypertension   [place] [facility] Mal[facility] L[facility], MD   5 months ago Mixed hyperlipidemia   [place] [facility]   3 years ago Chronic  midline low back pain without sciatica   [place] [facility] Mal[facility] L[facility], MD   3 years ago Chronic bilateral low back pain without sciatica   [place] [facility] Malva Limes, MD       Future Appointments             In 2 months Iran Ouch, MD [place] [facility]             metoprolol succinate (TOPROL-XL) 50 MG 24 hr tablet 30 tablet 0    Sig: Take 1 tablet (50 mg total) by mouth daily. Take with or immediately following a meal.     Cardiovascular:  Beta Blockers Passed - 05/06/2023  4:10 PM      Passed - Last BP in normal range    BP Readings from Last 1 Encounters:  05/05/23 133/84         Passed - Last Heart Rate in normal range    Pulse Readings from Last 1 Encounters:  05/05/23 63         Passed - Valid encounter within last 6 months    Recent Outpatient Visits           1 month ago New onset atrial fibrillation [facility])    [facility] Mal[facility] L[facility], MD   4 months ago Primary hypertension   [facility] Sherrie Mustache, Demetrios Isaacs, MD   5 months ago Mixed hyperlipidemia   [facility], MD   3 years ago Chronic midline low back pain without sciatica   [place] [address] [place] [facility] Malva L[facility], MD   3 years ago Chronic bilateral low back pain without sciatica   [place] [facility] Malva Limes, MD       Future Appointments             In 2 months Kirke Corin, Chelsea Aus, MD [facility]             potassium chloride SA (KLOR-CON M) 20 MEQ tablet 60 tablet 0    Sig: Take 1 tablet (20 mEq total) by mouth 2 (two) times daily.     Endocrinology:  Minerals - Potassium Supplementation Passed - 05/06/2023  4:10 PM      Passed - K in normal range and within 360 days    Potassium  Date Value Ref Range Status  04/30/2023 4.0 3.5 - 5.2 mmol/L Final          Passed - Cr in normal range and within 360 days    Creat  Date Value Ref Range Status  06/22/2017 0.79 0.70 - 1.33 mg/dL Final    Comment:    For patients >64 years of age, the reference limit for Creatinine is approximately 13% higher for people identified as African-American. .    Creatinine, Ser  Date Value Ref Range Status  04/30/2023 1.02 0.76 - 1.27 mg/dL Final         Passed - Valid encounter within last 12 months    Recent Outpatient Visits           1 month ago New onset atrial fibrillation [facility])   [place]   4 months ago Primary hypertension   [place] [facility], MD   5 months ago Mixed hyperlipidemia   [place] [facility] Malva Limes, MD   3 years ago Chronic midline low back pain without sciatica   [place] [facility], MD   3 years ago Chronic bilateral low back pain without sciatica   [place] [facility] Mal[facility], MD       Future Appointments             In 2 months [place] Ouch, MD [place] [facility]             thiamine (VITAMIN B-1) 100 MG tablet 30 tablet     Sig: Take 1 tablet (100 mg total) by mouth daily.     Off-Protocol Failed - 05/06/2023  4:10 PM      Failed - Medication not assigned to a protocol, review manually.      Passed - Valid encounter within last 12 months    Recent Outpatient Visits           1 month ago New onset atrial fibrillation [facility])   [place] [facility] Mal[facility], MD   4 months ago Primary hypertension   [place] [place] [facility] Mal[facility] L[facility], MD   5 months ago Mixed hyperlipidemia   [facility] Mal[facility] Limes, MD   3 years ago Chronic midline low back pain without sciatica   [facility], MD   3 years  ago Chronic bilateral low back pain without sciatica   [place] [facility] Mal[facility], MD       Future Appointments             In 2 months Kirke Corin, Chelsea Aus, MD [facility]             sacubitril-valsartan [facility]) 49-51 MG 60 tablet 0     Off-Protocol Failed - 05/06/2023  4:10 PM      Failed - Medication not assigned to a protocol, review manually.      Passed - Valid encounter within last 12 months    Recent Outpatient Visits           1 month ago New onset atrial fibrillation [facility])   [place] [facility] Mal[facility], MD   4 months ago Primary hypertension   [place] [facility] Mal[facility], MD   5 months ago Mixed hyperlipidemia   [place] [facility] Malva Limes, MD   3 years ago Chronic midline low back pain without sciatica   [place] [facility] Malva Limes, MD   3 years ago Chronic bilateral low back pain without sciatica   [facility], MD       Future Appointments             In 2 months Kirke Corin, Chelsea Aus, MD [facility]

## 2023-05-08 ENCOUNTER — Telehealth: Payer: Self-pay | Admitting: Family Medicine

## 2023-05-08 MED ORDER — CLONAZEPAM 0.5 MG PO TABS: ORAL_TABLET | ORAL | 2 refills | Status: DC

## 2023-05-08 NOTE — Telephone Encounter (Signed)
Already requested yesterday in a separate refill encounter and routed to the office, pending provider approval.

## 2023-05-08 NOTE — Telephone Encounter (Signed)
Requested Prescriptions  Pending Prescriptions Disp Refills   ENTRESTO 49-51 MG [Pharmacy Med Name: ENTRESTO 49-51MG  TABLET] 60 tablet 0    Sig: TAKE ONE TABLET BY MOUTH TWO (TWO) TIMES DAILY.     Off-Protocol Failed - 05/07/2023  9:41 AM      Failed - Medication not assigned to a protocol, review manually.      Passed - Valid encounter within last 12 months    Recent Outpatient Visits           1 month ago New onset atrial fibrillation [facility])   [facility] Malva Limes, MD   4 months ago Primary hypertension   [facility], MD   5 months ago Mixed hyperlipidemia   [place] [facility] Malva Limes, MD   3 years ago Chronic midline low back pain without sciatica   [place] [facility] Malva Limes, MD   3 years ago Chronic bilateral low back pain without sciatica   [facility] Malva Limes, MD       Future Appointments             In 2 months Kirke Corin, Chelsea Aus, MD [facility]             amiodarone (PACERONE) 200 MG tablet [Pharmacy Med Name: AMIODARONE HYDROCHLORIDE 200MG  TABLET] 60 tablet 0    Sig: TAKE ONE TABLET (200 MG TOTAL) BY MOUTH TWO (TWO) TIMES DAILY.     Not Delegated - Cardiovascular: Antiarrhythmic Agents - amiodarone Failed - 05/07/2023  9:41 AM      Failed - This refill cannot be delegated      Failed - Manual Review: Eye exam recommended every 12 months      Failed - Mg Level in normal range and within 360 days    Magnesium  Date Value Ref Range Status  03/27/2023 2.4 (H) 1.6 - 2.3 mg/dL Final         Passed - TSH in normal range and within 360 days    TSH  Date Value Ref Range Status  04/30/2023 2.930 0.450 - 4.500 uIU/mL Final         Passed - K in normal range and within 180 days    Potassium  Date Value Ref Range Status  04/30/2023 4.0 3.5 - 5.2 mmol/L Final          Passed - AST in normal range and within 180 days    AST  Date Value Ref Range Status  04/30/2023 17 0 - 40 IU/L Final         Passed - ALT in normal range and within 180 days    ALT  Date Value Ref Range Status  04/30/2023 25 0 - 44 IU/L Final         Passed - Patient had ECG in the last 180 days      Passed - Patient is not pregnant      Passed - Last BP in normal range    BP Readings from Last 1 Encounters:  05/05/23 133/84         Passed - Last Heart Rate in normal range    Pulse Readings from Last 1 Encounters:  05/05/23 63         Passed - Valid encounter within last 6 months    Recent Outpatient Visits  1 month ago New onset atrial fibrillation [facility])   [place] [facility] Sherrie Mustache, Demetrios Isaacs, MD   4 months ago Primary hypertension   [place] [facility] Malva Limes, MD   5 months ago Mixed hyperlipidemia   [facility] Malva Limes, MD   3 years ago Chronic midline low back pain without sciatica   [place] [facility] Mal[facility], MD   3 years ago Chronic bilateral low back pain without sciatica   [place] [facility] Malva Limes, MD       Future Appointments             In 2 months Kirke Corin, Chelsea Aus, MD [place] [facility] - Patient had chest x-ray within the last 6 months      Refused Prescriptions Disp Refills   potassium chloride SA (KLOR-CON M) 20 MEQ tablet [Pharmacy Med Name: POTASSIUM CHLORIDE ER ER TABLET ER] 60 tablet 0    Sig: TAKE ONE TABLET (20 MEQ TOTAL) BY MOUTH TWO (TWO) TIMES DAILY.     Endocrinology:  Minerals - Potassium Supplementation Passed - 05/07/2023  9:41 AM      Passed - K in normal range and within 360 days    Potassium  Date Value Ref Range Status  04/30/2023 4.0 3.5 - 5.2 mmol/L Final         Passed - Cr in normal range and within 360 days    Creat  Date  Value Ref Range Status  06/22/2017 0.79 0.70 - 1.33 mg/dL Final    Comment:    For patients >38 years of age, the reference limit for Creatinine is approximately 13% higher for people identified as African-American. .    Creatinine, Ser  Date Value Ref Range Status  04/30/2023 1.02 0.76 - 1.27 mg/dL Final         Passed - Valid encounter within last 12 months    Recent Outpatient Visits           1 month ago New onset atrial fibrillation [facility])   [place] [facility], MD   4 months ago Primary hypertension   [place] [facility] Malva [facility], MD   5 months ago Mixed hyperlipidemia   [facility] Malva L[facility], MD   3 years ago Chronic midline low back pain without sciatica   [place] [facility] Malva Limes, MD   3 years ago Chronic bilateral low back pain without sciatica   [place] [facility] Malva [facility], MD       Future Appointments             In 2 months Kirke Corin, Chelsea Aus, MD  [facility]             metoprolol succinate (TOPROL-XL) 50 MG 24 hr tablet [Pharmacy Med Name: METOPROLOL SUCCINATE ER 50MG  ER TABLET ER 24HR] 30 tablet 0    Sig: TAKE ONE TABLET (50 MG TOTAL) BY MOUTH DAILY. TAKE WITH OR IMMEDIATELY FOLLOWING A MEAL.     Cardiovascular:  Beta Blockers Passed - 05/07/2023  9:41 AM      Passed - Last BP in normal range    BP Readings from Last 1 Encounters:  05/05/23 133/84  Passed - Last Heart Rate in normal range    Pulse Readings from Last 1 Encounters:  05/05/23 63         Passed - Valid encounter within last 6 months    Recent Outpatient Visits           1 month ago New onset atrial fibrillation (HCC)   [place] [facility] Malva L[facility], MD   4 months ago Primary hypertension   [place] [facility] Malva L[facility], MD   5 months ago Mixed  hyperlipidemia   [place] [facility] Malva Limes, MD   3 years ago Chronic midline low back pain without sciatica   [place] [facility] Malva Limes, MD   3 years ago Chronic bilateral low back pain without sciatica   [place] [facility] Mal[facility], MD       Future Appointments             In 2 months Kirke Corin, Chelsea Aus, MD [place] [facility]             furosemide (LASIX) 40 MG tablet [Pharmacy Med Name: FUROSEMIDE 40MG  TABLET] 60 tablet 0    Sig: TAKE 1 TABLET BY MOUTH TWICE A DAY     Cardiovascular:  Diuretics - Loop Failed - 05/07/2023  9:41 AM      Failed - Mg Level in normal range and within 180 days    Magnesium  Date Value Ref Range Status  03/27/2023 2.4 (H) 1.6 - 2.3 mg/dL Final         Passed - K in normal range and within 180 days    Potassium  Date Value Ref Range Status  04/30/2023 4.0 3.5 - 5.2 mmol/L Final         Passed - Ca in normal range and within 180 days    Calcium  Date Value Ref Range Status  04/30/2023 9.1 8.6 - 10.2 mg/dL Final         Passed - Na in normal range and within 180 days    Sodium  Date Value Ref Range Status  04/30/2023 144 134 - 144 mmol/L Final         Passed - Cr in normal range and within 180 days    Creat  Date Value Ref Range Status  06/22/2017 0.79 0.70 - 1.33 mg/dL Final    Comment:    For patients >9 years of age, the reference limit for Creatinine is approximately 13% higher for people identified as African-American. .    Creatinine, Ser  Date Value Ref Range Status  04/30/2023 1.02 0.76 - 1.27 mg/dL Final         Passed - Cl in normal range and within 180 days    Chloride  Date Value Ref Range Status  04/30/2023 106 96 - 106 mmol/L Final         Passed - Last BP in normal range    BP Readings from Last 1 Encounters:  05/05/23 133/84         Passed - Valid encounter within last 6 months    Recent Outpatient  Visits           1 month ago New onset atrial fibrillation [facility])   [place] [facility] Mal[facility], MD   4 months ago Primary hypertension   [place] [facility] Malva Limes, MD  5 months ago Mixed hyperlipidemia   [place] [facility] Malva Limes, MD   3 years ago Chronic midline low back pain without sciatica   [place] [facility] Mal[facility] L[facility], MD   3 years ago Chronic bilateral low back pain without sciatica   [facility] Malva Limes, MD       Future Appointments             In 2 months Kirke Corin, Chelsea Aus, MD [place] [facility]             folic acid (FOLVITE) 1 MG tablet [Pharmacy Med Name: FOLIC ACID 1MG  TABLET] 30 tablet 0    Sig: TAKE ONE TABLET (1 MG TOTAL) BY MOUTH DAILY.     Endocrinology:  Vitamins Passed - 05/07/2023  9:41 AM      Passed - Valid encounter within last 12 months    Recent Outpatient Visits           1 month ago New onset atrial fibrillation (HCC)   [place] Mal[facility] L[facility], MD   4 months ago Primary hypertension   [place] [facility], MD   5 months ago Mixed hyperlipidemia   [place] [facility] Malva Limes, MD   3 years ago Chronic midline low back pain without sciatica   [place] [facility] Malva Limes, MD   3 years ago Chronic bilateral low back pain without sciatica   [place] [facility] Mal[facility], MD       Future Appointments             In 2 months Kirke Corin, Chelsea Aus, MD [place] [facility]             atorvastatin (LIPITOR) 40 MG tablet [Pharmacy Med Name: ATORVASTATIN CALCIUM 40MG  TABLET] 30 tablet 0    Sig: TAKE ONE TABLET (40 MG TOTAL) BY MOUTH AT BEDTIME.     Cardiovascular:  Antilipid - Statins Failed - 05/07/2023  9:41 AM       Failed - Lipid Panel in normal range within the last 12 months    Cholesterol, Total  Date Value Ref Range Status  11/19/2022 236 (H) 100 - 199 mg/dL Final   LDL Cholesterol (Calc)  Date Value Ref Range Status  06/22/2017 118 (H) mg/dL (calc) Final    Comment:    Reference range: <100 . Desirable range <100 mg/dL for primary prevention;   <70 mg/dL for patients with CHD or diabetic patients  with > or = 2 CHD risk factors. Marland Kitchen LDL-C is now calculated using the Martin-Hopkins  calculation, which is a validated novel method providing  better accuracy than the Friedewald equation in the  estimation of LDL-C.  Horald Pollen et al. Lenox Ahr. 1610;960(45): 2061-2068  (http://education.[employer].com/faq/FAQ164)    LDL Chol Calc (NIH)  Date Value Ref Range Status  11/19/2022 147 (H) 0 - 99 mg/dL Final   LDL Direct  Date Value Ref Range Status  03/27/2023 84 0 - 99 mg/dL Final   HDL  Date Value Ref Range Status  11/19/2022 49 >39 mg/dL Final   Triglycerides  Date Value Ref Range Status  11/19/2022 222 (H) 0 - 149 mg/dL Final         Passed - Patient is not pregnant      Passed - Valid encounter within last 12 months  Recent Outpatient Visits           1 month ago New onset atrial fibrillation [facility])   [place] [facility] Mal[facility] Limes, MD   4 months ago Primary hypertension   [facility], MD   5 months ago Mixed hyperlipidemia   [facility] Mal[facility], MD   3 years ago Chronic midline low back pain without sciatica   [place] [facility] Malva Limes, MD   3 years ago Chronic bilateral low back pain without sciatica   [facility] Malva Limes, MD       Future Appointments             In 2 months Kirke Corin, Chelsea Aus, MD [facility]

## 2023-05-08 NOTE — Telephone Encounter (Signed)
Copied from CRM [id number]. Topic: General - Other >> May 08, 2023  5:03 PM Everette C wrote: Reason for CRM: Medication Refill - Medication: amiodarone (PACERONE) 200 MG tablet [[id number]]  thiamine (VITAMIN B-1) 100 MG tablet [[id number]]  ENTRESTO 49-51 MG [[id number]]    Has the patient contacted their pharmacy? Yes.   (Agent: If no, request that the patient contact the pharmacy for the refill. If patient does not wish to contact the pharmacy document the reason why and proceed with request.) (Agent: If yes, when and what did the pharmacy advise?)  Preferred Pharmacy (with phone number or street name): [facility] - [place], [place] - [address] [place] [address] Phone: [phone] Fax: [phone] Hours: Not open 24 hours   Has the patient been seen for an appointment in the last year OR does the patient have an upcoming appointment? Yes.    Agent: Please be advised that RX refills may take up to 3 business days. We ask that you follow-up with your pharmacy.

## 2023-05-08 NOTE — Telephone Encounter (Signed)
Requested medication (s) are due for refill today: Yes  Requested medication (s) are on the active medication list: Yes  Last refill:    Future visit scheduled:   Notes to clinic:  Manual review.    Requested Prescriptions  Pending Prescriptions Disp Refills   ENTRESTO 49-51 MG [Pharmacy Med Name: ENTRESTO 49-51MG  TABLET] 60 tablet 0    Sig: TAKE ONE TABLET BY MOUTH TWO (TWO) TIMES DAILY.     Off-Protocol Failed - 05/07/2023  9:41 AM      Failed - Medication not assigned to a protocol, review manually.      Passed - Valid encounter within last 12 months    Recent Outpatient Visits           1 month ago New onset atrial fibrillation [facility])   [place] [facility] Sherrie Mustache, Demetrios Isaacs, MD   4 months ago Primary hypertension   [place] [facility] Malva Limes, MD   5 months ago Mixed hyperlipidemia   [place] [facility] Malva Limes, MD   3 years ago Chronic midline low back pain without sciatica   [place] [facility] Malva Limes, MD   3 years ago Chronic bilateral low back pain without sciatica   [facility], MD       Future Appointments             In 2 months Kirke Corin, Chelsea Aus, MD [place] [facility]             amiodarone (PACERONE) 200 MG tablet [Pharmacy Med Name: AMIODARONE HYDROCHLORIDE 200MG  TABLET] 60 tablet 0    Sig: TAKE ONE TABLET (200 MG TOTAL) BY MOUTH TWO (TWO) TIMES DAILY.     Not Delegated - Cardiovascular: Antiarrhythmic Agents - amiodarone Failed - 05/07/2023  9:41 AM      Failed - This refill cannot be delegated      Failed - Manual Review: Eye exam recommended every 12 months      Failed - Mg Level in normal range and within 360 days    Magnesium  Date Value Ref Range Status  03/27/2023 2.4 (H) 1.6 - 2.3 mg/dL Final         Passed - TSH in normal range and within 360 days    TSH  Date Value Ref  Range Status  04/30/2023 2.930 0.450 - 4.500 uIU/mL Final         Passed - K in normal range and within 180 days    Potassium  Date Value Ref Range Status  04/30/2023 4.0 3.5 - 5.2 mmol/L Final         Passed - AST in normal range and within 180 days    AST  Date Value Ref Range Status  04/30/2023 17 0 - 40 IU/L Final         Passed - ALT in normal range and within 180 days    ALT  Date Value Ref Range Status  04/30/2023 25 0 - 44 IU/L Final         Passed - Patient had ECG in the last 180 days      Passed - Patient is not pregnant      Passed - Last BP in normal range    BP Readings from Last 1 Encounters:  05/05/23 133/84         Passed - Last Heart Rate in normal range  Pulse Readings from Last 1 Encounters:  05/05/23 63         Passed - Valid encounter within last 6 months    Recent Outpatient Visits           1 month ago New onset atrial fibrillation [facility])   [place] [facility] Malva Limes, MD   4 months ago Primary hypertension   [place] [facility] Malva Limes, MD   5 months ago Mixed hyperlipidemia   [place] [facility] Malva Limes, MD   3 years ago Chronic midline low back pain without sciatica   [place] [facility] Malva Limes, MD   3 years ago Chronic bilateral low back pain without sciatica   [facility] Malva [facility]imes, MD       Future Appointments             In 2 months Kirke Corin, Chelsea Aus, MD [place] [facility] - Patient had chest x-ray within the last 6 months      Refused Prescriptions Disp Refills   potassium chloride SA (KLOR-CON M) 20 MEQ tablet [Pharmacy Med Name: POTASSIUM CHLORIDE ER ER TABLET ER] 60 tablet 0    Sig: TAKE ONE TABLET (20 MEQ TOTAL) BY MOUTH TWO (TWO) TIMES DAILY.     Endocrinology:  Minerals - Potassium Supplementation Passed - 05/07/2023  9:41 AM       Passed - K in normal range and within 360 days    Potassium  Date Value Ref Range Status  04/30/2023 4.0 3.5 - 5.2 mmol/L Final         Passed - Cr in normal range and within 360 days    Creat  Date Value Ref Range Status  06/22/2017 0.79 0.70 - 1.33 mg/dL Final    Comment:    For patients >63 years of age, the reference limit for Creatinine is approximately 13% higher for people identified as African-American. .    Creatinine, Ser  Date Value Ref Range Status  04/30/2023 1.02 0.76 - 1.27 mg/dL Final         Passed - Valid encounter within last 12 months    Recent Outpatient Visits           1 month ago New onset atrial fibrillation [facility])   [facility] Mal[facility], [facility]   4 months ago Primary hypertension   [facility] Malva [facility], MD   5 months ago Mixed hyperlipidemia   [place] [facility] Malva Limes, MD   3 years ago Chronic midline low back pain without sciatica   [place] [facility] Malva Limes, MD   3 years ago Chronic bilateral low back pain without sciatica   [place] [facility] Malva Limes, MD       Future Appointments             In 2 months Kirke Corin, Chelsea Aus, MD [place] [facility]             metoprolol succinate (TOPROL-XL) 50 MG 24 hr tablet [Pharmacy Med Name: METOPROLOL SUCCINATE ER 50MG  ER TABLET ER 24HR] 30 tablet 0    Sig: TAKE ONE TABLET (50 MG TOTAL) BY MOUTH DAILY. TAKE WITH OR IMMEDIATELY FOLLOWING A MEAL.  Cardiovascular:  Beta Blockers Passed - 05/07/2023  9:41 AM      Passed - Last BP in normal range    BP Readings from Last 1 Encounters:  05/05/23 133/84         Passed - Last Heart Rate in normal range    Pulse Readings from Last 1 Encounters:  05/05/23 63         Passed - Valid encounter within last 6 months    Recent Outpatient Visits           1 month ago New  onset atrial fibrillation (HCC)   [place] [facility] Malva Limes, MD   4 months ago Primary hypertension   [place] [facility] Malva Limes, MD   5 months ago Mixed hyperlipidemia   [place] [facility] Malva Limes, MD   3 years ago Chronic midline low back pain without sciatica   [place] [facility] Malva Limes, MD   3 years ago Chronic bilateral low back pain without sciatica   [place] Malva Limes, MD       Future Appointments             In 2 months Kirke Corin, Chelsea Aus, MD [place] [facility]             furosemide (LASIX) 40 MG tablet [Pharmacy Med Name: FUROSEMIDE 40MG  TABLET] 60 tablet 0    Sig: TAKE 1 TABLET BY MOUTH TWICE A DAY     Cardiovascular:  Diuretics - Loop Failed - 05/07/2023  9:41 AM      Failed - Mg Level in normal range and within 180 days    Magnesium  Date Value Ref Range Status  03/27/2023 2.4 (H) 1.6 - 2.3 mg/dL Final         Passed - K in normal range and within 180 days    Potassium  Date Value Ref Range Status  04/30/2023 4.0 3.5 - 5.2 mmol/L Final         Passed - Ca in normal range and within 180 days    Calcium  Date Value Ref Range Status  04/30/2023 9.1 8.6 - 10.2 mg/dL Final         Passed - Na in normal range and within 180 days    Sodium  Date Value Ref Range Status  04/30/2023 144 134 - 144 mmol/L Final         Passed - Cr in normal range and within 180 days    Creat  Date Value Ref Range Status  06/22/2017 0.79 0.70 - 1.33 mg/dL Final    Comment:    For patients >16 years of age, the reference limit for Creatinine is approximately 13% higher for people identified as African-American. .    Creatinine, Ser  Date Value Ref Range Status  04/30/2023 1.02 0.76 - 1.27 mg/dL Final         Passed - Cl in normal range and within 180 days    Chloride  Date Value Ref Range Status   04/30/2023 106 96 - 106 mmol/L Final         Passed - Last BP in normal range    BP Readings from Last 1 Encounters:  05/05/23 133/84         Passed - Valid encounter within last 6 months    Recent Outpatient Visits  1 month ago New onset atrial fibrillation [facility])   [place] [facility] Malva [facility]imes, MD   4 months ago Primary hypertension   [place] [facility] Malva Limes, MD   5 months ago Mixed hyperlipidemia   [place] Malva Limes, MD   3 years ago Chronic midline low back pain without sciatica   [place] [facility] Malva Limes, MD   3 years ago Chronic bilateral low back pain without sciatica   [place] [facility] Malva Limes, MD       Future Appointments             In 2 months Kirke Corin, Chelsea Aus, MD [place] [facility]             folic acid (FOLVITE) 1 MG tablet [Pharmacy Med Name: FOLIC ACID 1MG  TABLET] 30 tablet 0    Sig: TAKE ONE TABLET (1 MG TOTAL) BY MOUTH DAILY.     Endocrinology:  Vitamins Passed - 05/07/2023  9:41 AM      Passed - Valid encounter within last 12 months    Recent Outpatient Visits           1 month ago New onset atrial fibrillation (HCC)   [place] [facility] Mal[facility], MD   4 months ago Primary hypertension   [place] [facility] Malva [facility], MD   5 months ago Mixed hyperlipidemia   [place] [facility] Malva Limes, MD   3 years ago Chronic midline low back pain without sciatica   [facility] Malva L[facility], MD   3 years ago Chronic bilateral low back pain without sciatica   [place] [facility] Malva Limes, MD       Future Appointments             In 2 months Kirke Corin, Chelsea Aus, MD [place] [facility]             atorvastatin  (LIPITOR) 40 MG tablet [Pharmacy Med Name: ATORVASTATIN CALCIUM 40MG  TABLET] 30 tablet 0    Sig: TAKE ONE TABLET (40 MG TOTAL) BY MOUTH AT BEDTIME.     Cardiovascular:  Antilipid - Statins Failed - 05/07/2023  9:41 AM      Failed - Lipid Panel in normal range within the last 12 months    Cholesterol, Total  Date Value Ref Range Status  11/19/2022 236 (H) 100 - 199 mg/dL Final   LDL Cholesterol (Calc)  Date Value Ref Range Status  06/22/2017 118 (H) mg/dL (calc) Final    Comment:    Reference range: <100 . Desirable range <100 mg/dL for primary prevention;   <70 mg/dL for patients with CHD or diabetic patients  with > or = 2 CHD risk factors. Marland Kitchen LDL-C is now calculated using the Martin-Hopkins  calculation, which is a validated novel method providing  better accuracy than the Friedewald equation in the  estimation of LDL-C.  Horald Pollen et al. Lenox Ahr. 1308;657(84): 2061-2068  (http://education.[employer].com/faq/FAQ164)    LDL Chol Calc (NIH)  Date Value Ref Range Status  11/19/2022 147 (H) 0 - 99 mg/dL Final   LDL Direct  Date Value Ref Range Status  03/27/2023 84 0 - 99 mg/dL Final   HDL  Date Value Ref Range Status  11/19/2022 49 >39 mg/dL Final   Triglycerides  Date Value  Ref Range Status  11/19/2022 222 (H) 0 - 149 mg/dL Final         Passed - Patient is not pregnant      Passed - Valid encounter within last 12 months    Recent Outpatient Visits           1 month ago New onset atrial fibrillation [facility])   [place] [facility] Malva Limes, MD   4 months ago Primary hypertension   [place] [facility] Mal[facility] L[facility], MD   5 months ago Mixed hyperlipidemia   [place] [facility] Mal[facility], MD   3 years ago Chronic midline low back pain without sciatica   [place] Malva Limes, MD   3 years ago Chronic bilateral low back pain without sciatica   [facility] Malva Limes, MD       Future Appointments             In 2 months Kirke Corin, Chelsea Aus, MD [facility]

## 2023-05-10 ENCOUNTER — Encounter: Payer: Self-pay | Admitting: Cardiovascular Disease

## 2023-05-10 NOTE — H&P (Signed)
H&P Addendum, pre-cardioversion ° °Patient was seen and evaluated prior to -cardioversion procedure °Symptoms, prior testing details again confirmed with the patient °Patient examined, no significant change from prior exam °Lab work reviewed in detail personally by myself °Patient understands risk and benefit of the procedure,  °The risks (stroke, cardiac arrhythmias rarely resulting in the need for a temporary or permanent pacemaker, skin irritation or burns and complications associated with conscious sedation including aspiration, arrhythmia, respiratory failure and death), benefits (restoration of normal sinus rhythm) and alternatives of a direct current cardioversion were explained in detail °Patient willing to proceed. ° °Signed, °Tim Gollan, MD, Ph.D °[facility]  °

## 2023-05-10 NOTE — Interval H&P Note (Signed)
History and Physical Interval Note:  05/10/2023 4:25 PM  Stephen [name]  has presented today for surgery, with the diagnosis of Cardioversion   Afib.  The various methods of treatment have been discussed with the patient and family. After consideration of risks, benefits and other options for treatment, the patient has consented to  Procedure(s): CARDIOVERSION (N/A) as a surgical intervention.  The patient's history has been reviewed, patient examined, no change in status, stable for surgery.  I have reviewed the patient's chart and labs.  Questions were answered to the patient's satisfaction.     [name]

## 2023-05-11 NOTE — Telephone Encounter (Signed)
Stephen [name],  He had recent successful cardioversion. I recommend stopping digoxin but continuing all other cardiac medications.  Please provide him with enough refills on all cardiac meds.

## 2023-05-12 NOTE — Telephone Encounter (Signed)
Left a message for the patient to call back.  

## 2023-05-13 MED ORDER — AMIODARONE HCL 200 MG PO TABS: 200.0000 mg | ORAL_TABLET | Freq: Two times a day (BID) | ORAL | 3 refills | Status: DC

## 2023-05-13 MED ORDER — ENTRESTO 49-51 MG PO TABS: 1.0000 | ORAL_TABLET | Freq: Two times a day (BID) | ORAL | 3 refills | Status: DC

## 2023-05-13 MED ORDER — METOPROLOL SUCCINATE ER 50 MG PO TB24: 50.0000 mg | ORAL_TABLET | Freq: Every day | ORAL | 3 refills | Status: DC

## 2023-05-13 NOTE — Telephone Encounter (Signed)
Please see patient's MyChart message.

## 2023-05-14 MED ORDER — AMIODARONE HCL 200 MG PO TABS: 200.0000 mg | ORAL_TABLET | Freq: Every day | ORAL | 3 refills | Status: DC

## 2023-05-14 NOTE — Addendum Note (Signed)
Addended by: Sandi Mariscal on: 05/14/2023 12:36 PM   Modules accepted: Orders

## 2023-06-02 ENCOUNTER — Other Ambulatory Visit: Payer: Self-pay | Admitting: Family Medicine

## 2023-06-02 DIAGNOSIS — Z419 Encounter for procedure for purposes other than remedying health state, unspecified: Secondary | ICD-10-CM | POA: Diagnosis not present

## 2023-06-15 ENCOUNTER — Telehealth: Payer: Self-pay | Admitting: Family Medicine

## 2023-06-15 ENCOUNTER — Other Ambulatory Visit: Payer: Self-pay | Admitting: Family Medicine

## 2023-06-15 DIAGNOSIS — Z23 Encounter for immunization: Secondary | ICD-10-CM

## 2023-06-15 DIAGNOSIS — I10 Essential (primary) hypertension: Secondary | ICD-10-CM

## 2023-06-15 NOTE — Telephone Encounter (Signed)
Requested medication (s) are due for refill today: Yes  Requested medication (s) are on the active medication list: Yes  Last refill:  04/03/15  Future visit scheduled: No  Notes to clinic:  Unable to refill per protocol, last refill by another provider. Also see the below message regarding amlodipine and the pharmacist wanting a call back.      Requested Prescriptions  Pending Prescriptions Disp Refills   niacin (VITAMIN B3) 500 MG tablet      Sig: Take 1 tablet (500 mg total) by mouth daily.     Cardiovascular:  Antilipid - niacin Failed - 06/15/2023 10:53 AM      Failed - Lipid Panel in normal range within the last 12 months    Cholesterol, Total  Date Value Ref Range Status  11/19/2022 236 (H) 100 - 199 mg/dL Final   LDL Cholesterol (Calc)  Date Value Ref Range Status  06/22/2017 118 (H) mg/dL (calc) Final    Comment:    Reference range: <100 . Desirable range <100 mg/dL for primary prevention;   <70 mg/dL for patients with CHD or diabetic patients  with > or = 2 CHD risk factors. Marland Kitchen LDL-C is now calculated using the Martin-Hopkins  calculation, which is a validated novel method providing  better accuracy than the Friedewald equation in the  estimation of LDL-C.  Horald Pollen et al. Lenox Ahr. 1610;960(45): 2061-2068  (http://education.[employer].com/faq/FAQ164)    LDL Chol Calc (NIH)  Date Value Ref Range Status  11/19/2022 147 (H) 0 - 99 mg/dL Final   LDL Direct  Date Value Ref Range Status  03/27/2023 84 0 - 99 mg/dL Final   HDL  Date Value Ref Range Status  11/19/2022 49 >39 mg/dL Final   Triglycerides  Date Value Ref Range Status  11/19/2022 222 (H) 0 - 149 mg/dL Final         Passed - AST in normal range and within 180 days    AST  Date Value Ref Range Status  04/30/2023 17 0 - 40 IU/L Final         Passed - ALT in normal range and within 180 days    ALT  Date Value Ref Range Status  04/30/2023 25 0 - 44 IU/L Final         Passed - Valid  encounter within last 12 months    Recent Outpatient Visits           2 months ago New onset atrial [facility])   [place] [facility] Malva Limes, MD   5 months ago Primary hypertension   [place] [facility] Malva L[facility], MD   6 months ago Mixed hyperlipidemia   [place] [facility] Malva Limes, MD   3 years ago Chronic midline low back pain without sciatica   [place] [facility] Malva Limes, MD   3 years ago Chronic bilateral low back pain without sciatica   [facility] Malva Limes, MD       Future Appointments             In 1 month Kirke Corin, Chelsea Aus, MD [facility]

## 2023-06-15 NOTE — Telephone Encounter (Signed)
Medication Refill - Medication: niacin 500 MG tablet   Has the patient contacted their pharmacy? Yes.     Preferred Pharmacy (with phone number or street name):  [facility] - [place], [place] - [address] Phone: [phone]  Fax: [phone]      Has the patient been seen for an appointment in the last year OR does the patient have an upcoming appointment? Yes.    The patient also told the pharmacist he is not sure why his Amlodipine has not been refilled. The pharmacist said she would check on it for him. It is possible he was taken off that medication in July when he had surgery. It does say in July for the patient to stop taking at discharge so that explains why it was discontinued but the patient for some reason doesn't realize this. The pharmacist Olegario Shearer also needs to know this from the provider. Please assist patient further

## 2023-06-16 NOTE — Telephone Encounter (Signed)
Requested medication (s) are due for refill today: yes  Requested medication (s) are on the active medication list: yes  Last refill:  03/07/23 #60 3 RF  Future visit scheduled: no  Notes to clinic:  Prescriber not at this practice -was last ordered at hospital discharge by Dr. Meriam Sprague   Requested Prescriptions  Pending Prescriptions Disp Refills   ELIQUIS 5 MG TABS tablet [Pharmacy Med Name: ELIQUIS 5MG  TABLET] 60 tablet 3    Sig: TAKE ONE TABLET (5 MG TOTAL) BY MOUTH TWO (TWO) TIMES DAILY.     Hematology:  Anticoagulants - apixaban Passed - 06/15/2023  9:51 AM      Passed - PLT in normal range and within 360 days    Platelets  Date Value Ref Range Status  04/30/2023 178 150 - 450 x10E3/uL Final         Passed - HGB in normal range and within 360 days    Hemoglobin  Date Value Ref Range Status  04/30/2023 15.4 13.0 - 17.7 g/dL Final         Passed - HCT in normal range and within 360 days    Hematocrit  Date Value Ref Range Status  04/30/2023 45.1 37.5 - 51.0 % Final         Passed - Cr in normal range and within 360 days    Creat  Date Value Ref Range Status  06/22/2017 0.79 0.70 - 1.33 mg/dL Final    Comment:    For patients >43 years of age, the reference limit for Creatinine is approximately 13% higher for people identified as African-American. .    Creatinine, Ser  Date Value Ref Range Status  04/30/2023 1.02 0.76 - 1.27 mg/dL Final         Passed - AST in normal range and within 360 days    AST  Date Value Ref Range Status  04/30/2023 17 0 - 40 IU/L Final         Passed - ALT in normal range and within 360 days    ALT  Date Value Ref Range Status  04/30/2023 25 0 - 44 IU/L Final         Passed - Valid encounter within last 12 months    Recent Outpatient Visits           2 months ago New onset atrial fibrillation [facility])   [place] [facility] Malva Limes, MD   5 months ago Primary hypertension   [place] [facility] Malva Limes, MD   6 months ago Mixed hyperlipidemia   [facility] Malva Limes, MD   3 years ago Chronic midline low back pain without sciatica   [place] [facility] Malva Limes, MD   3 years ago Chronic bilateral low back pain without sciatica   [facility] Malva Limes, MD       Future Appointments             In 1 month Kirke Corin, Chelsea Aus, MD [facility]

## 2023-06-16 NOTE — Telephone Encounter (Signed)
No longer listed on current medication list

## 2023-06-18 NOTE — Telephone Encounter (Signed)
Amlodipine was supposed to be discontinued when he was discharged from the hospital because he was started on metoprolol. However, his blood pressure was perfect when he was at the cardiologist in August. Was he taking both medications at that time? If so then I would stay on both and we can sent a refill of amlodipine.

## 2023-06-18 NOTE — Telephone Encounter (Signed)
LVMTCB. CRM created. Ok for [facility] to advise/clarify.

## 2023-06-22 DIAGNOSIS — H5213 Myopia, bilateral: Secondary | ICD-10-CM | POA: Diagnosis not present

## 2023-06-29 ENCOUNTER — Other Ambulatory Visit: Payer: Self-pay | Admitting: Cardiovascular Disease

## 2023-06-29 ENCOUNTER — Other Ambulatory Visit: Payer: Self-pay | Admitting: Family Medicine

## 2023-06-30 NOTE — Telephone Encounter (Signed)
Requested Prescriptions  Refused Prescriptions Disp Refills   folic acid (FOLVITE) 1 MG tablet [Pharmacy Med Name: FOLIC ACID 1MG  TABLET] 90 tablet 0    Sig: TAKE ONE TABLET (1 MG TOTAL) BY MOUTH DAILY.     Endocrinology:  Vitamins Passed - 06/29/2023  4:25 PM      Passed - Valid encounter within last 12 months    Recent Outpatient Visits           3 months ago New onset atrial fibrillation [facility])   [place] [facility] Malva Limes, MD   6 months ago Primary hypertension   [place] [facility] Malva Limes, MD   7 months ago Mixed hyperlipidemia   [place] [facility] Malva Limes, MD   3 years ago Chronic midline low back pain without sciatica   [place] [facility] Malva Limes, MD   3 years ago Chronic bilateral low back pain without sciatica   [facility] Malva Limes, MD       Future Appointments             In 1 month Kirke Corin, Chelsea Aus, MD [facility]

## 2023-07-03 DIAGNOSIS — Z419 Encounter for procedure for purposes other than remedying health state, unspecified: Secondary | ICD-10-CM | POA: Diagnosis not present

## 2023-07-19 ENCOUNTER — Encounter: Payer: Self-pay | Admitting: Cardiology

## 2023-07-21 NOTE — Progress Notes (Unsigned)
Electrophysiology Office Note:    Date:  07/22/2023   ID:  Stephen [name], DOB 1961-07-18, MRN [id number]  [facility] Cardiologist:  Lorine Bears, MD  [facility] Electrophysiologist:  Lanier Prude, MD   Referring MD: Iran Ouch, MD   Chief Complaint: Atrial fibrillation  History of Present Illness:      Discussed the use of AI scribe software for clinical note transcription with the patient, who gave verbal consent to proceed.  The patient is a 62 year old man who I am seeing today for an evaluation of atrial fibrillation.  The patient follows with Dr. Kirke Corin in clinic.  He has a history of chronic systolic heart failure and persistent atrial fibrillation.  He also has a history of hypertension, hyperlipidemia, prior alcohol use, chronic back pain, anxiety.  He has been hospitalized in July of this year with decompensated heart failure in the setting of atrial fibrillation.  His EF was reduced and was thought to be secondary to tachycardia mediated cardiomyopathy.  He was started on amiodarone and underwent cardioversion.  At the appoint with Dr. Kirke Corin he was in atrial flutter.  Repeat cardioversion was arranged.  He takes Eliquis for stroke prophylaxis.  He underwent cardioversion on May 05, 2023.  He is with his wife today in clinic.  He is fairly active.  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  He takes his medications as prescribed.  He did not feel the palpitations or irregular heartbeat when he was in atrial fibrillation but he did have symptoms of decompensated heart failure including shortness of breath, edema, fatigue.  He is felt better now that he is maintaining normal rhythm and has his heart failure better controlled.  He is interested in avoiding long-term exposure to amiodarone given the risk of off target effects.         Their past medical, social and family history was  reveiwed.   ROS:   Please see the history of present illness.    All other systems reviewed and are negative.  EKGs/Labs/Other Studies Reviewed:    The following studies were reviewed today:  April 30, 2023 EKG shows typical appearing atrial flutter with variable AV conduction  May 05, 2023 EKG shows sinus bradycardia  March 03, 2023 EKG shows atrial fibrillation with rapid ventricular rates  Echo on March 03, 2023 shows EF 20 to 25% RV function mildly reduced Mildly dilated left atrium Severely dilated right atrium Mild to moderate MR  EKG Interpretation Date/Time:  Wednesday July 22 2023 09:42:58 EST Ventricular Rate:  59 PR Interval:  162 QRS Duration:  106 QT Interval:  438 QTC Calculation: 433 R Axis:   -4  Text Interpretation: Sinus bradycardia with sinus arrhythmia Confirmed by Steffanie Dunn [id number] on 07/22/2023 10:15:14 AM    Physical Exam:    VS:  BP 124/70   Pulse (!) 59   Ht 6\' 2"  (1.88 m)   Wt 231 lb (104.8 kg)   SpO2 95%   BMI 29.66 kg/m     Wt Readings from Last 3 Encounters:  07/22/23 231 lb (104.8 kg)  05/05/23 225 lb (102.1 kg)  04/30/23 228 lb (103.4 kg)    General: Well-appearing male no distress Cardiac: Regular rate and rhythm, no murmurs Respiratory: Clear lungs bilaterally.  No increased work of breathing.      ASSESSMENT AND PLAN:    1. Persistent atrial fibrillation (HCC)   2. Chronic  systolic heart failure (HCC)   3. Primary hypertension   4. Typical atrial flutter (HCC)       Persistent atrial fibrillation flutter High risk med monitoring-amiodarone   Rhythm control indicated.  Continue Eliquis for stroke prophylaxis.  Check CMP TSH and free T4 today given amiodarone use  Discussed treatment options today for AF including antiarrhythmic drug therapy and ablation. Discussed risks, recovery and likelihood of success with each treatment strategy. Risk, benefits, and alternatives to EP study and ablation for  afib were discussed. These risks include but are not limited to stroke, bleeding, vascular damage, tamponade, perforation, damage to the esophagus, lungs, phrenic nerve and other structures, pulmonary vein stenosis, worsening renal function, coronary vasospasm and death.  Discussed potential need for repeat ablation procedures and antiarrhythmic drugs after an initial ablation. The patient understands these risk and wishes to proceed.  We will therefore proceed with catheter ablation at the next available time.  Carto, ICE, anesthesia are requested for the procedure.  Will also obtain CT PV protocol prior to the procedure to exclude LAA thrombus and further evaluate atrial anatomy.  #Chronic systolic heart failure NYHA class II today.  Thought to be tachycardia mediated.  Rhythm control indicated as above. Continue Entresto, Toprol, Lasix     Signed, Sheria Lang T. Lalla Brothers, MD, [facility], [facility] 07/22/2023 10:29 AM    Electrophysiology [facility]

## 2023-07-22 ENCOUNTER — Encounter: Payer: Self-pay | Admitting: Cardiology

## 2023-07-22 ENCOUNTER — Other Ambulatory Visit: Payer: Self-pay | Admitting: Family Medicine

## 2023-07-22 ENCOUNTER — Ambulatory Visit: Payer: Medicaid Other | Attending: Cardiology | Admitting: Cardiology

## 2023-07-22 VITALS — BP 124/70 | HR 59 | Ht 74.0 in | Wt 231.0 lb

## 2023-07-22 DIAGNOSIS — I4819 Other persistent atrial fibrillation: Secondary | ICD-10-CM | POA: Diagnosis not present

## 2023-07-22 DIAGNOSIS — I10 Essential (primary) hypertension: Secondary | ICD-10-CM

## 2023-07-22 DIAGNOSIS — I483 Typical atrial flutter: Secondary | ICD-10-CM

## 2023-07-22 DIAGNOSIS — F439 Reaction to severe stress, unspecified: Secondary | ICD-10-CM

## 2023-07-22 DIAGNOSIS — I5022 Chronic systolic (congestive) heart failure: Secondary | ICD-10-CM

## 2023-07-22 NOTE — Patient Instructions (Addendum)
Medication Instructions:  Your physician recommends that you continue on your current medications as directed. Please refer to the Current Medication list given to you today.  *If you need a refill on your cardiac medications before your next appointment, please call your pharmacy*   Lab Work: Your provider would like for you to return around February 17th to have the following labs drawn: CMET, TSH, T4, CBC.   Please go to [facility] [address] ([facility] [address] [facility], [place] [address] You do not need an appointment.  They are open from 7:30 am-4 pm.  Lunch from 1:00 pm- 2:00 pm You do NOT need to be fasting.  Testing/Procedures: Your physician has requested that you have cardiac CT. Cardiac computed tomography (CT) is a painless test that uses an x-ray machine to take clear, detailed pictures of your heart. For further information please visit [email or web address] We will call you to schedule your CT scan. It will be done about 2-3 weeks prior to your ablation.  Your physician has recommended that you have an ablation. Catheter ablation is a medical procedure used to treat some cardiac arrhythmias (irregular heartbeats). During catheter ablation, a long, thin, flexible tube is put into a blood vessel in your groin (upper thigh), or neck. This tube is called an ablation catheter. It is then guided to your heart through the blood vessel. Radio frequency waves destroy small areas of heart tissue where abnormal heartbeats may cause an arrhythmia to start.  You are scheduled for Atrial Fibrillation Ablation on Friday, March 7 with Dr. Steffanie Dunn.Please arrive at the Main Entrance A at [facility]: [address] [place], [place] [address] at 5:30 AM    Follow-Up: At [facility], you and your health needs are our priority.  As part of our continuing mission to provide you with exceptional heart care, we have created designated Provider Care Teams.   These Care Teams include your primary Cardiologist (physician) and Advanced Practice Providers (APPs -  Physician Assistants and Nurse Practitioners) who all work together to provide you with the care you need, when you need it.  Your next appointment:   We will call you to arrange your follow up appointments.

## 2023-07-23 NOTE — Telephone Encounter (Signed)
Requested medication (s) are due for refill today: Yes  Requested medication (s) are on the active medication list: Yes  Last refill:  05/08/23  Future visit scheduled:   Notes to clinic:  Not delegated.    Requested Prescriptions  Pending Prescriptions Disp Refills   clonazePAM (KLONOPIN) 0.5 MG tablet [Pharmacy Med Name: CLONAZEPAM 0.5MG  TABLET] 30 tablet 2    Sig: TAKE 0.5-1 TABLETS (0.25-0.5 MG TOTAL) BY MOUTH TWO (TWO) TIMES DAILY AS NEEDED FOR ANXIETY.     Not Delegated - Psychiatry: Anxiolytics/Hypnotics 2 Failed - 07/22/2023 10:30 AM      Failed - This refill cannot be delegated      Failed - Urine Drug Screen completed in last 360 days      Passed - Patient is not pregnant      Passed - Valid encounter within last 6 months    Recent Outpatient Visits           3 months ago New onset atrial fibrillation [facility])   [facility] Malva Limes, MD   6 months ago Primary hypertension   [place] [facility] Malva Limes, MD   8 months ago Mixed hyperlipidemia   [place] [facility] Malva Limes, MD   3 years ago Chronic midline low back pain without sciatica   [place] [facility] Malva Limes, MD   3 years ago Chronic bilateral low back pain without sciatica   [facility] Malva Limes, MD       Future Appointments             In 1 week Kirke Corin, Chelsea Aus, MD [facility] at [facility]

## 2023-07-27 ENCOUNTER — Telehealth: Payer: Self-pay | Admitting: Family Medicine

## 2023-07-27 ENCOUNTER — Other Ambulatory Visit: Payer: Self-pay | Admitting: Family Medicine

## 2023-07-27 DIAGNOSIS — F439 Reaction to severe stress, unspecified: Secondary | ICD-10-CM

## 2023-07-27 MED ORDER — CLONAZEPAM 0.5 MG PO TABS: 0.5000 mg | ORAL_TABLET | Freq: Two times a day (BID) | ORAL | 2 refills | Status: DC | PRN

## 2023-07-27 NOTE — Telephone Encounter (Signed)
Landon the Pharmacist at [facility] states the provider says his refill on his clonazePAM (KLONOPIN) 0.5 MG tablet is too early but she states it is not as he is taking 2 daily or 30 for a 15 day supply so he is due for his refill. Please assist patient further.

## 2023-08-02 DIAGNOSIS — Z419 Encounter for procedure for purposes other than remedying health state, unspecified: Secondary | ICD-10-CM | POA: Diagnosis not present

## 2023-08-04 ENCOUNTER — Ambulatory Visit: Payer: Medicaid Other | Attending: Cardiovascular Disease | Admitting: Cardiovascular Disease

## 2023-08-19 ENCOUNTER — Other Ambulatory Visit: Payer: Self-pay

## 2023-08-19 DIAGNOSIS — I483 Typical atrial flutter: Secondary | ICD-10-CM | POA: Diagnosis not present

## 2023-08-19 DIAGNOSIS — I5022 Chronic systolic (congestive) heart failure: Secondary | ICD-10-CM

## 2023-08-19 DIAGNOSIS — I4819 Other persistent atrial fibrillation: Secondary | ICD-10-CM | POA: Diagnosis not present

## 2023-08-19 DIAGNOSIS — I10 Essential (primary) hypertension: Secondary | ICD-10-CM | POA: Diagnosis not present

## 2023-08-20 ENCOUNTER — Ambulatory Visit
Admission: RE | Admit: 2023-08-20 | Discharge: 2023-08-20 | Disposition: A | Discharge status: Home / Self Care | Payer: Medicaid Other | Source: Ambulatory Visit | Attending: Cardiology | Admitting: Cardiology

## 2023-08-20 DIAGNOSIS — I483 Typical atrial flutter: Secondary | ICD-10-CM | POA: Insufficient documentation

## 2023-08-20 DIAGNOSIS — I10 Essential (primary) hypertension: Secondary | ICD-10-CM | POA: Diagnosis not present

## 2023-08-20 DIAGNOSIS — I4819 Other persistent atrial fibrillation: Secondary | ICD-10-CM | POA: Diagnosis not present

## 2023-08-20 DIAGNOSIS — I2510 Atherosclerotic heart disease of native coronary artery without angina pectoris: Secondary | ICD-10-CM | POA: Insufficient documentation

## 2023-08-20 DIAGNOSIS — I5022 Chronic systolic (congestive) heart failure: Secondary | ICD-10-CM | POA: Diagnosis not present

## 2023-08-20 LAB — COMPREHENSIVE METABOLIC PANEL
ALT: 24 [IU]/L (ref 0–44)
AST: 19 [IU]/L (ref 0–40)
Albumin: 4 g/dL (ref 3.9–4.9)
Alkaline Phosphatase: 73 [IU]/L (ref 44–121)
BUN/Creatinine Ratio: 13 (ref 10–24)
BUN: 12 mg/dL (ref 8–27)
Bilirubin Total: 0.6 mg/dL (ref 0.0–1.2)
CO2: 28 mmol/L (ref 20–29)
Calcium: 9 mg/dL (ref 8.6–10.2)
Chloride: 101 mmol/L (ref 96–106)
Creatinine, Ser: 0.89 mg/dL (ref 0.76–1.27)
Globulin, Total: 2.7 g/dL (ref 1.5–4.5)
Glucose: 96 mg/dL (ref 70–99)
Potassium: 4 mmol/L (ref 3.5–5.2)
Sodium: 141 mmol/L (ref 134–144)
Total Protein: 6.7 g/dL (ref 6.0–8.5)
eGFR: 97 mL/min/{1.73_m2} (ref >59)

## 2023-08-20 LAB — CBC
Hematocrit: 45.1 % (ref 37.5–51.0)
Hemoglobin: 14.9 g/dL (ref 13.0–17.7)
MCH: 29.8 pg (ref 26.6–33.0)
MCHC: 33 g/dL (ref 31.5–35.7)
MCV: 90 fL (ref 79–97)
Platelets: 147 10*3/uL — ABNORMAL LOW (ref 150–450)
RBC: 5 x10E6/uL (ref 4.14–5.80)
RDW: 12.2 % (ref 11.6–15.4)
WBC: 5 10*3/uL (ref 3.4–10.8)

## 2023-08-20 LAB — TSH: TSH: 2.01 u[IU]/mL (ref 0.450–4.500)

## 2023-08-20 LAB — T4, FREE: Free T4: 1.4 ng/dL (ref 0.82–1.77)

## 2023-08-20 MED ORDER — SODIUM CHLORIDE 0.9 % IV BOLUS
150.0000 mL | Freq: Once | INTRAVENOUS | Status: AC
  Administered 2023-08-20: 150 mL via INTRAVENOUS

## 2023-08-20 MED ORDER — IOHEXOL 350 MG/ML SOLN
75.0000 mL | Freq: Once | INTRAVENOUS | Status: AC | PRN
  Administered 2023-08-20: 75 mL via INTRAVENOUS

## 2023-08-22 ENCOUNTER — Other Ambulatory Visit: Payer: Self-pay | Admitting: Cardiovascular Disease

## 2023-08-22 ENCOUNTER — Other Ambulatory Visit: Payer: Self-pay | Admitting: Family Medicine

## 2023-08-22 DIAGNOSIS — M545 Low back pain, unspecified: Secondary | ICD-10-CM

## 2023-08-24 ENCOUNTER — Other Ambulatory Visit: Payer: Self-pay | Admitting: Family Medicine

## 2023-08-24 ENCOUNTER — Other Ambulatory Visit: Payer: Self-pay | Admitting: Cardiovascular Disease

## 2023-08-25 NOTE — Telephone Encounter (Signed)
Requested Prescriptions  Pending Prescriptions Disp Refills   folic acid (FOLVITE) 1 MG tablet [Pharmacy Med Name: FOLIC ACID 1MG  TABLET] 90 tablet 1    Sig: TAKE ONE TABLET (1 MG TOTAL) BY MOUTH DAILY.     Endocrinology:  Vitamins Passed - 08/25/2023  2:54 PM      Passed - Valid encounter within last 12 months    Recent Outpatient Visits           5 months ago New onset atrial fibrillation [facility])   [place] [facility] Malva Limes, MD   8 months ago Primary hypertension   [place] Malva Limes, MD   9 months ago Mixed hyperlipidemia   [facility] Malva Limes, MD   3 years ago Chronic midline low back pain without sciatica   [facility] Malva Limes, MD   3 years ago Chronic bilateral low back pain without sciatica   [facility] Malva Limes, MD

## 2023-08-31 ENCOUNTER — Encounter: Payer: Self-pay | Admitting: General Practice

## 2023-09-02 DIAGNOSIS — Z419 Encounter for procedure for purposes other than remedying health state, unspecified: Secondary | ICD-10-CM | POA: Diagnosis not present

## 2023-09-03 ENCOUNTER — Encounter: Payer: Self-pay | Admitting: Cardiology

## 2023-09-03 NOTE — Pre-Procedure Instructions (Signed)
 Attempted to call patient regarding procedure instructions.  Left voice mail on the following.  Instructed patient on the following items: Arrival time 0800 Nothing to eat or drink after midnight No meds AM of procedure Responsible person to drive you home and stay with you for 24 hrs  Have you missed any doses of anti-coagulant Eliquis - should be taken twice a day, If you have missed any doses please let us  know.  Don't take dose in the morning.

## 2023-09-04 ENCOUNTER — Other Ambulatory Visit: Payer: Self-pay

## 2023-09-04 ENCOUNTER — Ambulatory Visit (HOSPITAL_COMMUNITY): Payer: Medicaid Other | Admitting: Certified Registered"

## 2023-09-04 ENCOUNTER — Encounter (HOSPITAL_COMMUNITY): Payer: Self-pay | Admitting: Cardiology

## 2023-09-04 ENCOUNTER — Ambulatory Visit (HOSPITAL_COMMUNITY)
Admission: RE | Disposition: A | Discharge status: Home / Self Care | Payer: Self-pay | Source: Home / Self Care | Attending: Cardiology

## 2023-09-04 ENCOUNTER — Ambulatory Visit (HOSPITAL_BASED_OUTPATIENT_CLINIC_OR_DEPARTMENT_OTHER): Payer: Medicaid Other | Admitting: Certified Registered"

## 2023-09-04 ENCOUNTER — Observation Stay (HOSPITAL_COMMUNITY)
Admission: RE | Admit: 2023-09-04 | Discharge: 2023-09-05 | Disposition: A | Discharge status: Home / Self Care | Payer: Medicaid Other | Attending: Cardiology | Admitting: Cardiology

## 2023-09-04 DIAGNOSIS — I4819 Other persistent atrial fibrillation: Principal | ICD-10-CM | POA: Diagnosis present

## 2023-09-04 DIAGNOSIS — E785 Hyperlipidemia, unspecified: Secondary | ICD-10-CM | POA: Diagnosis not present

## 2023-09-04 DIAGNOSIS — I110 Hypertensive heart disease with heart failure: Secondary | ICD-10-CM | POA: Insufficient documentation

## 2023-09-04 DIAGNOSIS — R21 Rash and other nonspecific skin eruption: Secondary | ICD-10-CM | POA: Insufficient documentation

## 2023-09-04 DIAGNOSIS — I4891 Unspecified atrial fibrillation: Secondary | ICD-10-CM | POA: Diagnosis not present

## 2023-09-04 DIAGNOSIS — I483 Typical atrial flutter: Secondary | ICD-10-CM | POA: Diagnosis not present

## 2023-09-04 DIAGNOSIS — I5022 Chronic systolic (congestive) heart failure: Secondary | ICD-10-CM | POA: Diagnosis not present

## 2023-09-04 DIAGNOSIS — I5020 Unspecified systolic (congestive) heart failure: Secondary | ICD-10-CM

## 2023-09-04 DIAGNOSIS — I509 Heart failure, unspecified: Secondary | ICD-10-CM | POA: Diagnosis not present

## 2023-09-04 DIAGNOSIS — I10 Essential (primary) hypertension: Secondary | ICD-10-CM | POA: Diagnosis present

## 2023-09-04 HISTORY — PX: ATRIAL FIBRILLATION ABLATION: EP1191

## 2023-09-04 LAB — POCT ACTIVATED CLOTTING TIME: Activated Clotting Time: 216 s

## 2023-09-04 SURGERY — ATRIAL FIBRILLATION ABLATION
Anesthesia: General

## 2023-09-04 MED ORDER — ONDANSETRON HCL 4 MG/2ML IJ SOLN: 4.0000 mg | Freq: Four times a day (QID) | INTRAMUSCULAR | Status: DC | PRN

## 2023-09-04 MED ORDER — SUGAMMADEX SODIUM 200 MG/2ML IV SOLN
INTRAVENOUS | Status: DC | PRN
  Administered 2023-09-04: 250 mg via INTRAVENOUS

## 2023-09-04 MED ORDER — PANTOPRAZOLE SODIUM 40 MG PO TBEC
40.0000 mg | DELAYED_RELEASE_TABLET | Freq: Every day | ORAL | Status: DC
Start: 2023-09-04 — End: 2023-09-04
  Filled 2023-09-04: qty 1

## 2023-09-04 MED ORDER — HEPARIN SODIUM (PORCINE) 1000 UNIT/ML IJ SOLN
INTRAMUSCULAR | Status: AC
  Filled 2023-09-04: qty 10

## 2023-09-04 MED ORDER — HEPARIN (PORCINE) IN NACL 1000-0.9 UT/500ML-% IV SOLN
INTRAVENOUS | Status: DC | PRN
  Administered 2023-09-04 (×3): 500 mL

## 2023-09-04 MED ORDER — DEXAMETHASONE SODIUM PHOSPHATE 10 MG/ML IJ SOLN
INTRAMUSCULAR | Status: DC | PRN
  Administered 2023-09-04: 5 mg via INTRAVENOUS

## 2023-09-04 MED ORDER — ONDANSETRON HCL 4 MG/2ML IJ SOLN
INTRAMUSCULAR | Status: DC | PRN
  Administered 2023-09-04: 4 mg via INTRAVENOUS

## 2023-09-04 MED ORDER — LIDOCAINE 2% (20 MG/ML) 5 ML SYRINGE
INTRAMUSCULAR | Status: DC | PRN
  Administered 2023-09-04: 100 mg via INTRAVENOUS

## 2023-09-04 MED ORDER — DIPHENHYDRAMINE HCL 50 MG/ML IJ SOLN
INTRAMUSCULAR | Status: AC
  Filled 2023-09-04: qty 1

## 2023-09-04 MED ORDER — SODIUM CHLORIDE 0.9 % IV SOLN: 250.0000 mL | INTRAVENOUS | Status: DC | PRN

## 2023-09-04 MED ORDER — FENTANYL CITRATE (PF) 250 MCG/5ML IJ SOLN
INTRAMUSCULAR | Status: DC | PRN
  Administered 2023-09-04: 100 ug via INTRAVENOUS

## 2023-09-04 MED ORDER — SODIUM CHLORIDE 0.9% FLUSH: 3.0000 mL | INTRAVENOUS | Status: DC | PRN

## 2023-09-04 MED ORDER — FENTANYL CITRATE (PF) 100 MCG/2ML IJ SOLN
INTRAMUSCULAR | Status: AC
  Filled 2023-09-04: qty 2

## 2023-09-04 MED ORDER — HEPARIN SODIUM (PORCINE) 1000 UNIT/ML IJ SOLN
INTRAMUSCULAR | Status: DC | PRN
  Administered 2023-09-04: 1000 [IU] via INTRAVENOUS

## 2023-09-04 MED ORDER — EPHEDRINE SULFATE-NACL 50-0.9 MG/10ML-% IV SOSY
PREFILLED_SYRINGE | INTRAVENOUS | Status: DC | PRN
  Administered 2023-09-04: 10 mg via INTRAVENOUS

## 2023-09-04 MED ORDER — ACETAMINOPHEN 325 MG PO TABS
650.0000 mg | ORAL_TABLET | ORAL | Status: DC | PRN
  Administered 2023-09-04: 650 mg via ORAL

## 2023-09-04 MED ORDER — PROTAMINE SULFATE 10 MG/ML IV SOLN
INTRAVENOUS | Status: DC | PRN
  Administered 2023-09-04: 30 mg via INTRAVENOUS

## 2023-09-04 MED ORDER — APIXABAN 5 MG PO TABS
5.0000 mg | ORAL_TABLET | Freq: Two times a day (BID) | ORAL | Status: DC
  Administered 2023-09-04 – 2023-09-05 (×3): 5 mg via ORAL
  Filled 2023-09-04 (×3): qty 1

## 2023-09-04 MED ORDER — DEXAMETHASONE SODIUM PHOSPHATE 4 MG/ML IJ SOLN
4.0000 mg | Freq: Once | INTRAMUSCULAR | Status: AC
  Administered 2023-09-04: 4 mg via INTRAVENOUS
  Filled 2023-09-04: qty 1

## 2023-09-04 MED ORDER — FAMOTIDINE IN NACL 20-0.9 MG/50ML-% IV SOLN
INTRAVENOUS | Status: AC
  Filled 2023-09-04: qty 50

## 2023-09-04 MED ORDER — ATROPINE SULFATE 0.4 MG/ML IV SOLN
INTRAVENOUS | Status: DC | PRN
  Administered 2023-09-04: 1 mg via INTRAVENOUS

## 2023-09-04 MED ORDER — HEPARIN SODIUM (PORCINE) 1000 UNIT/ML IJ SOLN
INTRAMUSCULAR | Status: DC | PRN
  Administered 2023-09-04: 8000 [IU] via INTRAVENOUS
  Administered 2023-09-04: 16000 [IU] via INTRAVENOUS

## 2023-09-04 MED ORDER — VASOPRESSIN 20 UNIT/ML IV SOLN
INTRAVENOUS | Status: DC | PRN
  Administered 2023-09-04 (×2): 1 [IU] via INTRAVENOUS

## 2023-09-04 MED ORDER — SODIUM CHLORIDE 0.9 % IV SOLN: INTRAVENOUS | Status: DC

## 2023-09-04 MED ORDER — PREDNISONE 20 MG PO TABS
40.0000 mg | ORAL_TABLET | Freq: Every day | ORAL | Status: DC
  Administered 2023-09-05: 40 mg via ORAL
  Filled 2023-09-04 (×2): qty 2

## 2023-09-04 MED ORDER — ROCURONIUM BROMIDE 10 MG/ML (PF) SYRINGE
PREFILLED_SYRINGE | INTRAVENOUS | Status: DC | PRN
  Administered 2023-09-04: 50 mg via INTRAVENOUS
  Administered 2023-09-04 (×2): 20 mg via INTRAVENOUS

## 2023-09-04 MED ORDER — PHENYLEPHRINE 80 MCG/ML (10ML) SYRINGE FOR IV PUSH (FOR BLOOD PRESSURE SUPPORT)
PREFILLED_SYRINGE | INTRAVENOUS | Status: DC | PRN
  Administered 2023-09-04 (×3): 160 ug via INTRAVENOUS

## 2023-09-04 MED ORDER — COLCHICINE 0.6 MG PO TABS
0.6000 mg | ORAL_TABLET | Freq: Two times a day (BID) | ORAL | Status: DC
Start: 2023-09-04 — End: 2023-09-04
  Filled 2023-09-04: qty 1

## 2023-09-04 MED ORDER — SODIUM CHLORIDE 0.9% FLUSH
3.0000 mL | Freq: Two times a day (BID) | INTRAVENOUS | Status: DC
  Administered 2023-09-05: 3 mL via INTRAVENOUS

## 2023-09-04 MED ORDER — PROPOFOL 10 MG/ML IV BOLUS
INTRAVENOUS | Status: DC | PRN
  Administered 2023-09-04: 130 mg via INTRAVENOUS

## 2023-09-04 MED ORDER — PHENYLEPHRINE HCL-NACL 20-0.9 MG/250ML-% IV SOLN
INTRAVENOUS | Status: DC | PRN
  Administered 2023-09-04: 50 ug/min via INTRAVENOUS

## 2023-09-04 MED ORDER — SUCCINYLCHOLINE CHLORIDE 200 MG/10ML IV SOSY
PREFILLED_SYRINGE | INTRAVENOUS | Status: DC | PRN
  Administered 2023-09-04: 140 mg via INTRAVENOUS

## 2023-09-04 MED ORDER — DIPHENHYDRAMINE HCL 50 MG/ML IJ SOLN
25.0000 mg | Freq: Once | INTRAMUSCULAR | Status: AC
  Administered 2023-09-04: 25 mg via INTRAVENOUS

## 2023-09-04 MED ORDER — ALBUMIN HUMAN 5 % IV SOLN: INTRAVENOUS | Status: DC | PRN

## 2023-09-04 MED ORDER — ACETAMINOPHEN 325 MG PO TABS
ORAL_TABLET | ORAL | Status: AC
  Filled 2023-09-04: qty 2

## 2023-09-04 MED ORDER — DIPHENHYDRAMINE HCL 50 MG/ML IJ SOLN
INTRAMUSCULAR | Status: DC | PRN
  Administered 2023-09-04: 25 mg via INTRAVENOUS

## 2023-09-04 MED ORDER — FAMOTIDINE IN NACL 20-0.9 MG/50ML-% IV SOLN
20.0000 mg | INTRAVENOUS | Status: DC
  Administered 2023-09-04: 20 mg via INTRAVENOUS
  Filled 2023-09-04: qty 50

## 2023-09-04 SURGICAL SUPPLY — 23 items
BAG SNAP BAND KOVER 36X36 (MISCELLANEOUS) IMPLANT
BLANKET WARM UNDERBOD FULL ACC (MISCELLANEOUS) ×1 IMPLANT
CABLE PFA RX CATH CONN (CABLE) IMPLANT
CATH ABLAT QDOT MICRO BI TC FJ (CATHETERS) IMPLANT
CATH FARAWAVE ABLATION 31 (CATHETERS) IMPLANT
CATH OCTARAY 2.0 F 3-3-3-3-3 (CATHETERS) IMPLANT
CATH SOUNDSTAR ECO 8FR (CATHETERS) IMPLANT
CATH WEBSTER BI DIR CS D-F CRV (CATHETERS) IMPLANT
CLOSURE PERCLOSE PROSTYLE (VASCULAR PRODUCTS) IMPLANT
COVER SWIFTLINK CONNECTOR (BAG) ×1 IMPLANT
DILATOR VESSEL 38 20CM 16FR (INTRODUCER) IMPLANT
GUIDEWIRE INQWIRE 1.5J.035X260 (WIRE) IMPLANT
INQWIRE 1.5J .035X260CM (WIRE) ×1
MAT PREVALON FULL STRYKER (MISCELLANEOUS) IMPLANT
PACK EP LF (CUSTOM PROCEDURE TRAY) ×1 IMPLANT
PAD DEFIB RADIO PHYSIO CONN (PAD) ×1 IMPLANT
PATCH CARTO3 (PAD) IMPLANT
SHEATH FARADRIVE STEERABLE (SHEATH) IMPLANT
SHEATH PINNACLE 8F 10CM (SHEATH) IMPLANT
SHEATH PINNACLE 9F 10CM (SHEATH) IMPLANT
SHEATH PROBE COVER 6X72 (BAG) IMPLANT
SHEATH WIRE KIT BAYLIS SL1 (KITS) IMPLANT
TUBING SMART ABLATE COOLFLOW (TUBING) IMPLANT

## 2023-09-04 NOTE — H&P (Signed)
 Electrophysiology Office Note:     Date:  09/04/2023    ID:  Stephen [name], DOB [birth year], MRN [id number]   [facility] Cardiologist:  Deatrice Cage, MD  [facility] Electrophysiologist:  OLE ONEIDA HOLTS, MD    Referring MD: Cage Deatrice LABOR, MD    Chief Complaint: Atrial fibrillation   History of Present Illness:         Discussed the use of AI scribe software for clinical note transcription with the patient, who gave verbal consent to proceed.   The patient is a 63 year old man who I am seeing today for an evaluation of atrial fibrillation.  The patient follows with Dr. Cage in clinic.  He has a history of chronic systolic heart failure and persistent atrial fibrillation.  He also has a history of hypertension, hyperlipidemia, prior alcohol use, chronic back pain, anxiety.  He has been hospitalized in July of this year with decompensated heart failure in the setting of atrial fibrillation.  His EF was reduced and was thought to be secondary to tachycardia mediated cardiomyopathy.  He was started on amiodarone  and underwent cardioversion.  At the appoint with Dr. Cage he was in atrial flutter.  Repeat cardioversion was arranged.  He takes Eliquis  for stroke prophylaxis.  He underwent cardioversion on May 05, 2023.   He is with his wife today in clinic.  He is fairly active.  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  He takes his medications as prescribed.  He did not feel the palpitations or irregular heartbeat when he was in atrial fibrillation but he did have symptoms of decompensated heart failure including shortness of breath, edema, fatigue.  He is felt better now that he is maintaining normal rhythm and has his heart failure better controlled.  He is interested in avoiding long-term exposure to amiodarone  given the risk of off target effects.    Presents for AF/AFL ablation.       Objective Their past  medical, social and family history was reveiwed.     ROS:   Please see the history of present illness.    All other systems reviewed and are negative.   EKGs/Labs/Other Studies Reviewed:     The following studies were reviewed today:   April 30, 2023 EKG shows typical appearing atrial flutter with variable AV conduction   May 05, 2023 EKG shows sinus bradycardia   March 03, 2023 EKG shows atrial fibrillation with rapid ventricular rates   Echo on March 03, 2023 shows EF 20 to 25% RV function mildly reduced Mildly dilated left atrium Severely dilated right atrium Mild to moderate MR   EKG Interpretation Date/Time:                  Wednesday July 22 2023 09:42:58 EST Ventricular Rate:         59 PR Interval:                 162 QRS Duration:             106 QT Interval:                 438 QTC Calculation:433 R Axis:                         -4   Text Interpretation:Sinus bradycardia with sinus arrhythmia Confirmed by Holts Ole [phone]) on 07/22/2023 10:15:14 AM  Physical Exam:     VS:  BP 150/84   Pulse 54   Ht 6' 2 (1.88 m)   Wt 231 lb (104.8 kg)   SpO2 95%   BMI 29.66 kg/m         Wt Readings from Last 3 Encounters:  07/22/23 231 lb (104.8 kg)  05/05/23 225 lb (102.1 kg)  04/30/23 228 lb (103.4 kg)    General: Well-appearing male no distress Cardiac: Regular rate and rhythm, no murmurs Respiratory: Clear lungs bilaterally.  No increased work of breathing.     Assessment ASSESSMENT AND PLAN:     1. Persistent atrial fibrillation (HCC)   2. Chronic systolic heart failure (HCC)   3. Primary hypertension   4. Typical atrial flutter (HCC)           Persistent atrial fibrillation flutter High risk med monitoring-amiodarone      Rhythm control indicated.  Continue Eliquis  for stroke prophylaxis.   Check CMP TSH and free T4 today given amiodarone  use   Discussed treatment options today for AF including antiarrhythmic drug therapy and  ablation. Discussed risks, recovery and likelihood of success with each treatment strategy. Risk, benefits, and alternatives to EP study and ablation for afib were discussed. These risks include but are not limited to stroke, bleeding, vascular damage, tamponade, perforation, damage to the esophagus, lungs, phrenic nerve and other structures, pulmonary vein stenosis, worsening renal function, coronary vasospasm and death.  Discussed potential need for repeat ablation procedures and antiarrhythmic drugs after an initial ablation. The patient understands these risk and wishes to proceed.  We will therefore proceed with catheter ablation at the next available time.  Carto, ICE, anesthesia are requested for the procedure.  Will also obtain CT PV protocol prior to the procedure to exclude LAA thrombus and further evaluate atrial anatomy.   #Chronic systolic heart failure NYHA class II today.  Thought to be tachycardia mediated.  Rhythm control indicated as above. Continue Entresto , Toprol , Lasix     Presents for AF/AFL ablation.     Signed, Ole DASEN. Cindie, MD, [facility], [facility] 09/04/2023 Electrophysiology  [facility]

## 2023-09-04 NOTE — Transfer of Care (Signed)
 Immediate Anesthesia Transfer of Care Note  Patient: Stephen [name]  Procedure(s) Performed: ATRIAL FIBRILLATION ABLATION  Patient Location: PACU and Cath Lab  Anesthesia Type:General  Level of Consciousness: awake, drowsy, patient cooperative, and responds to stimulation  Airway & Oxygen  Therapy: Patient Spontanous Breathing and Patient connected to nasal cannula oxygen   Post-op Assessment: Report given to RN and Post -op Vital signs reviewed and stable.  Pt with rash on upper chest and under arms.    Post vital signs: Reviewed and stable  Last Vitals:  Vitals Value Taken Time  BP    Temp    Pulse 72 09/04/23 1218  Resp    SpO2 100 % 09/04/23 1218  Vitals shown include unfiled device data.  Last Pain:  Vitals:   09/04/23 0815  TempSrc: Oral         Complications:  Encounter Notable Events  Notable Event Outcome Phase Comment  Difficult to intubate - expected  Intraprocedure Filed from anesthesia note documentation.

## 2023-09-04 NOTE — Progress Notes (Signed)
  Pt arrived from EP via Bed to HA20. Report received from RN and CRNA. Pt vitals are stable, performed q30min see vitals flowsheet. Anesthesia recovery has been complicated by rash throughout pt body. Dr. Epifanio came by and ordered another 25mg  benadryl  IV. Given to pt. Rash does not seem to be improving with medication. Pt report handed off to [place], CHARITY FUNDRAISER. Care transitioned to her.

## 2023-09-04 NOTE — Anesthesia Procedure Notes (Signed)
 Procedure Name: Intubation Date/Time: 09/04/2023 10:28 AM  Performed by: Delores Dus, CRNAPre-anesthesia Checklist: Patient identified, Emergency Drugs available, Suction available and Patient being monitored Patient Re-evaluated:Patient Re-evaluated prior to induction Oxygen  Delivery Method: Circle system utilized Preoxygenation: Pre-oxygenation with 100% oxygen  Induction Type: IV induction Ventilation: Mask ventilation without difficulty Laryngoscope Size: Miller, 2, Glidescope and 4 Tube type: Oral Tube size: 7.5 mm Number of attempts: 2 Airway Equipment and Method: Oral airway and Rigid stylet Placement Confirmation: ETT inserted through vocal cords under direct vision, positive ETCO2 and breath sounds checked- equal and bilateral Secured at: 23 cm Tube secured with: Tape Dental Injury: Teeth and Oropharynx as per pre-operative assessment  Difficulty Due To: Difficulty was anticipated and Difficult Airway- due to limited oral opening Comments: Short TMD, small mouth opening, limited neck motion

## 2023-09-04 NOTE — Progress Notes (Addendum)
 Pt with puritic rash, torso, b/l UE/LE c/w drug reaction No SOB, tongue, throat swelling or difficulty breathing 93/48 (61), SR 80's, 90-95% RA Dr. Cindie bedside Will admit for observation Plan to continue steroid  He received decadron  by report 5mg  with his procedure at 10:41 In d/w pharmacy Decadron  4mg  again now Pepcid  20mg  IV now/daily while here Prednisone  40mg  tomorrow morning, pending his response to treatment from today If improving would plan PO taper   Solei Wubben, PA-C

## 2023-09-04 NOTE — Progress Notes (Signed)
 Dr. Lalla Brothers and Luster Landsberg, PA at bedside looking at body rash and talking w/patient. No difficulty breathing; no swelling of lips nor tongue. Upper eye lids puffy. Denies itching. Medicated for HA.

## 2023-09-04 NOTE — Anesthesia Postprocedure Evaluation (Signed)
 Anesthesia Post Note  Patient: Stephen [name]  Procedure(s) Performed: ATRIAL FIBRILLATION ABLATION     Patient location during evaluation: Cath Lab Anesthesia Type: General Level of consciousness: awake and alert Pain management: pain level controlled Vital Signs Assessment: post-procedure vital signs reviewed and stable Respiratory status: spontaneous breathing, nonlabored ventilation and respiratory function stable Cardiovascular status: blood pressure returned to baseline and stable Postop Assessment: no apparent nausea or vomiting Anesthetic complications: yes  Encounter Notable Events  Notable Event Outcome Phase Comment  Difficult to intubate - expected  Intraprocedure Filed from anesthesia note documentation.    Last Vitals:  Vitals:   09/04/23 1245 09/04/23 1510  BP: (!) 99/57 118/72  Pulse: 77 78  Resp: (!) 22 18  Temp:  36.9 C  SpO2: 96%     Last Pain:  Vitals:   09/04/23 1510  TempSrc: Oral  PainSc: 0-No pain                 Kambria Grima,W. EDMOND

## 2023-09-04 NOTE — Anesthesia Preprocedure Evaluation (Addendum)
 Anesthesia Evaluation  Patient identified by MRN, date of birth, ID band Patient awake    Reviewed: Allergy & Precautions, H&P , NPO status , Patient's Chart, lab work & pertinent test results  Airway Mallampati: III  TM Distance: >3 FB Neck ROM: Full    Dental no notable dental hx. (+) Teeth Intact, Dental Advisory Given   Pulmonary neg pulmonary ROS   Pulmonary exam normal breath sounds clear to auscultation       Cardiovascular hypertension, Pt. on medications and Pt. on home beta blockers +CHF  + dysrhythmias Atrial Fibrillation  Rhythm:Irregular Rate:Normal     Neuro/Psych negative neurological ROS  negative psych ROS   GI/Hepatic negative GI ROS, Neg liver ROS,,,  Endo/Other  negative endocrine ROS    Renal/GU negative Renal ROS  negative genitourinary   Musculoskeletal   Abdominal   Peds  Hematology negative hematology ROS (+)   Anesthesia Other Findings   Reproductive/Obstetrics negative OB ROS                             Anesthesia Physical Anesthesia Plan  ASA: 4  Anesthesia Plan: General   Post-op Pain Management: Tylenol  PO (pre-op)*   Induction: Intravenous  PONV Risk Score and Plan: 3 and Ondansetron  and Dexamethasone   Airway Management Planned: Oral ETT  Additional Equipment: ClearSight  Intra-op Plan:   Post-operative Plan: Extubation in OR  Informed Consent: I have reviewed the patients History and Physical, chart, labs and discussed the procedure including the risks, benefits and alternatives for the proposed anesthesia with the patient or authorized representative who has indicated his/her understanding and acceptance.     Dental advisory given  Plan Discussed with: CRNA  Anesthesia Plan Comments:        Anesthesia Quick Evaluation

## 2023-09-05 ENCOUNTER — Other Ambulatory Visit (HOSPITAL_COMMUNITY): Payer: Self-pay

## 2023-09-05 DIAGNOSIS — I4891 Unspecified atrial fibrillation: Secondary | ICD-10-CM | POA: Diagnosis not present

## 2023-09-05 DIAGNOSIS — I483 Typical atrial flutter: Secondary | ICD-10-CM | POA: Diagnosis not present

## 2023-09-05 DIAGNOSIS — R21 Rash and other nonspecific skin eruption: Secondary | ICD-10-CM | POA: Insufficient documentation

## 2023-09-05 DIAGNOSIS — I4819 Other persistent atrial fibrillation: Secondary | ICD-10-CM | POA: Diagnosis not present

## 2023-09-05 DIAGNOSIS — I5022 Chronic systolic (congestive) heart failure: Secondary | ICD-10-CM | POA: Diagnosis not present

## 2023-09-05 DIAGNOSIS — I110 Hypertensive heart disease with heart failure: Secondary | ICD-10-CM | POA: Diagnosis not present

## 2023-09-05 MED ORDER — PREDNISONE 10 MG PO TABS
ORAL_TABLET | ORAL | 0 refills | Status: AC
  Filled 2023-09-05: qty 16, 7d supply, fill #0

## 2023-09-05 MED ORDER — PANTOPRAZOLE SODIUM 40 MG PO TBEC
40.0000 mg | DELAYED_RELEASE_TABLET | Freq: Every day | ORAL | 0 refills | Status: DC
  Filled 2023-09-05: qty 45, 45d supply, fill #0

## 2023-09-05 MED ORDER — COLCHICINE 0.6 MG PO TABS
0.6000 mg | ORAL_TABLET | Freq: Two times a day (BID) | ORAL | 0 refills | Status: DC
  Filled 2023-09-05: qty 10, 5d supply, fill #0

## 2023-09-05 NOTE — Discharge Instructions (Signed)
 Medication Changes: - START Colchicine  0.6mg  twice daily for 5 days. - START Protonix  40mg  daily for 45 days. - START Prednisone  taper. You received the first dose in the hospital prior to discharge. Starting on 09/06/2023, you should 4 tablets (40mg ) for 1 day >> 3 tablets (30mg ) for 2 days >> 2 tablets (20mg ) for 2 days >> 1 tablet (10mg ) for 2 days. - OKAY to stop Aspirin  but CONTINUE Eliquis  5mg  twice daily.

## 2023-09-05 NOTE — Progress Notes (Signed)
   Progress Note  Patient Name: Stephen [name] Date of Encounter: 09/05/2023  Primary Cardiologist: Deatrice Cage, MD     Patient Profile     63 y.o. male admitted for catheter ablation for atrial fibrillation/CTI and was found postprocedure to have diffuse hives.  Was admitted for observation following steroids and Pepcid   Subjective   Feels better rash resolved  Inpatient Medications    Scheduled Meds:  apixaban   5 mg Oral BID   predniSONE   40 mg Oral Q breakfast   sodium chloride  flush  3 mL Intravenous Q12H   Continuous Infusions:  sodium chloride      famotidine  (PEPCID ) IV Stopped (09/04/23 1514)   PRN Meds: sodium chloride , acetaminophen , ondansetron  (ZOFRAN ) IV, sodium chloride  flush   Vital Signs    Vitals:   09/04/23 1614 09/04/23 2016 09/05/23 0413 09/05/23 0730  BP: (!) 140/66 116/70 127/66 139/80  Pulse:  69 66   Resp:  18 16 18   Temp:  98.6 F (37 C) 97.6 F (36.4 C) 98.5 F (36.9 C)  TempSrc:  Oral Oral Oral  SpO2:  97% 96% 98%  Weight:      Height:        Intake/Output Summary (Last 24 hours) at 09/05/2023 0942 Last data filed at 09/04/2023 2130 Gross per 24 hour  Intake 1780 ml  Output --  Net 1780 ml   Filed Weights   09/04/23 0815 09/04/23 1510  Weight: 107 kg 110.9 kg    Telemetry    Sinus  - Personally Reviewed  ECG       Physical Exam    GEN: No acute distress.   Neck: No JVD Cardiac: RRR, no murmurs, rubs, or gallops.  Respiratory: Clear to auscultation bilaterally. GI: Soft, nontender, non-distended  MS: No edema; No deformity. Neuro:  Nonfocal  Psych: Normal affect   Labs       Radiology    EP STUDY Result Date: 09/04/2023 CONCLUSIONS: 1. Successful PVI 2. Successful ablation/isolation of the cavotricuspid isthmus for typical atrial flutter 3. Intracardiac echo reveals reduced LV function, trivial pericardial effusion 4. No early apparent complications. 5. Colchicine  0.6mg  PO BID x 5 days 6. Protonix  40mg  PO  daily x 45 days     Assessment & Plan    Atrial fibrillation status post PVI and CTI ablation  Cardiomyopathy-severe mechanism unclear.  Presumed tachycardia mediated, no follow-up.  Known coronary atherosclerosis with a calcium  score of 247  Obesity  Sleep disordered breathing   Resume GDMT at discharge.  Continue prednisone  on a taper over 8 days, 40 x 2 days, 30 x 2 days, 20 x 2 days and 10 x 2 days  Will check echo as an outpatient.  Need clarification as to the mechanism of his cardiomyopathy  Colchicine  and Protonix  as per protocol   For questions or updates, please contact [facility] Please consult [email or web address] for contact info under Cardiology/STEMI.      Signed, Elspeth Sage, MD  09/05/2023, 9:42 AM

## 2023-09-05 NOTE — Discharge Summary (Signed)
 Discharge Summary    Patient ID: Stephen [name] MRN: [id number]; DOB: 06-12-1961  Admit date: 09/04/2023 Discharge date: 09/05/2023  PCP:  [name], MD   [facility] Providers Cardiologist:  Deatrice Cage, MD  Electrophysiologist:  OLE ONEIDA HOLTS, MD       Discharge Diagnoses    Principal Problem:   Atrial fibrillation/ flutter Active Problems:   Rash   Chronic HFrEF (heart failure with reduced ejection fraction) [facility])   Primary hypertension    Diagnostic Studies/Procedures    Atrial Fibrillation Ablation 09/04/2023: Conclusions: 1. Successful PVI 2. Successful ablation/isolation of the cavotricuspid isthmus for typical atrial flutter 3. Intracardiac echo reveals reduced LV function, trivial pericardial effusion 4. No early apparent complications. 5. Colchicine  0.6mg  PO BID x 5 days 6. Protonix  40mg  PO daily x 45 days  _____________   History of Present Illness     Stephen [name] is a 63 y.o. male with a history of chronic systolic CHF with EF of 20-25% on Echo in 03/2023, persistent atrial fibrillation/ flutter on Eliquis , hypertension, hyperlipidemia, chronic back pain, anxiety, and prior alcohol abuse who is followed by Dr. Cage and Dr. Holts. He was admitted in 03/2023 for acute CHF and atrial fibrillation with RVR. Echo showed LVEF of 20-25% with akinesis of the mid and distal anterior wall, anterior septum, apical inferior segment, and apex and hypokinesis of the mid and distal lateral wall, inferior septum, anterolateral segment, basal anterior segment, and mid inferior segment as well as mildly reduced RV function, severe right atrial enlargement, mild left atrial enlargement, mild to moderate MR, and mild TR. He was diuresed and then underwent successful TEE/ DCCV. He was started on GDMT and Amiodarone . Unfortunately, he was noted to be back in atrial flutter at follow-up visit in 04/2023 and was referred to Dr. Holts for consideration of  ablation.He was seen by Dr. Holts on 07/22/2023 who recommended an ablation which was arranged for 09/04/2023.  Hospital Course     Consultants: None   Patient presented to [facility] on 09/04/2023 for planned atrial flutter ablation. He underwent successful PVI and successful ablation/ isolation of the cavotricuspid isthmus for typical atrial flutter. There was no early apparent complications and plan was for same day discharge. However, he then developed a pruritic rash on his torso and bilateral upper and lower extremities consistent with a drug reaction. He had no shortness of breath, difficulty breathing, or tongue/ throat swelling suggestive of anaphylaxis. He had received a dose of Decadron  5mg  during his procedure. He was given another dose of Decadron  4mg  as well as IV Pepcid  20mg  and was admitted for observation. Rash had resolved by the next day. He will be discharged on a Prednisone  taper over 8 days (40mg  x 2 days, 30mg  x 2 days, 20mg  x 2 days and 10mg  x 2 days). Will also be discharged on Colchicine  0.6mg  twice daily for 5 days and Protonix  40mg  daily for 45 days after ablation. Will continue home medications (although will stop Aspirin  given he is on Eliquis ).   He needs follow-up of his cardiomyopathy. Etiology unclear. Presumed to be tachycardia mediated. Will need repeat Echo. If no improvement in EF, would recommend ischemic evaluation. He had a follow-up with Dr. Cage scheduled last month but looks like he missed this appointment. Will arrange follow-up with General Cardiology for follow-up of this.  Of note, Lipitor was discontinued because patient states he stopped taking this due to myalgias. Can readdress this at  outpatient follow-up.  Patient was seen and examined by Dr. Fernande today and determined to be stable for discharge. Outpatient follow-up with General Cardiology and EP/ A.Fib Clinic arranged. Medications as below. _____________  Discharge Vitals Blood pressure  139/80, pulse 66, temperature 98.5 F (36.9 C), temperature source Oral, resp. rate 18, height 6' 2 (1.88 m), weight 110.9 kg, SpO2 98%.  Filed Weights   09/04/23 0815 09/04/23 1510  Weight: 107 kg 110.9 kg    Labs & Radiologic Studies    CBC No results for input(s): WBC, NEUTROABS, HGB, HCT, MCV, PLT in the last 72 hours. Basic Metabolic Panel No results for input(s): NA, K, CL, CO2, GLUCOSE, BUN, CREATININE, CALCIUM , MG, PHOS in the last 72 hours. Liver Function Tests No results for input(s): AST, ALT, ALKPHOS, BILITOT, PROT, ALBUMIN  in the last 72 hours. No results for input(s): LIPASE, AMYLASE in the last 72 hours. High Sensitivity Troponin:   No results for input(s): TROPONINIHS in the last 720 hours.  BNP Invalid input(s): POCBNP D-Dimer No results for input(s): DDIMER in the last 72 hours. Hemoglobin A1C No results for input(s): HGBA1C in the last 72 hours. Fasting Lipid Panel No results for input(s): CHOL, HDL, LDLCALC, TRIG, CHOLHDL, LDLDIRECT in the last 72 hours. Thyroid  Function Tests No results for input(s): TSH, T4TOTAL, T3FREE, THYROIDAB in the last 72 hours.  Invalid input(s): FREET3 _____________  EP STUDY Result Date: 09/04/2023 CONCLUSIONS: 1. Successful PVI 2. Successful ablation/isolation of the cavotricuspid isthmus for typical atrial flutter 3. Intracardiac echo reveals reduced LV function, trivial pericardial effusion 4. No early apparent complications. 5. Colchicine  0.6mg  PO BID x 5 days 6. Protonix  40mg  PO daily x 45 days   CT CARDIAC MORPH/PULM VEIN W/CM&W/O CA SCORE Result Date: 08/20/2023 CLINICAL DATA:  Atrial fibrillation scheduled for an ablation. EXAM: Cardiac CT/CTA TECHNIQUE: The patient was scanned on a Siemens Somatom scanner. FINDINGS: A 120 kV prospective scan was triggered in the descending thoracic aorta at 111 HU's. Gantry rotation speed was 280 msecs and  collimation was .9 mm. No beta blockade and no NTG was given. The 3D data set was reconstructed in 5% intervals of the 60-80 % of the R-R cycle. Diastolic phases were analyzed on a dedicated work station using MPR, MIP and VRT modes. The patient received 75 cc of contrast. There is normal pulmonary vein drainage into the left atrium (2 on the right and 2 on the left) with ostial measurements as follows: RUPV: 28 x 25 mm, Area 51 mm2 RLPV: 28 x 22 mm, Area 47 mm2 LUPV: 19 x 14 mm, Area 20 mm2 LLPV: 19 x 11 mm, Area 15 mm2 The left atrial appendage is a chicken wing-cactus type with ostial size 29 x 20 mm and length 38 mm, Area 43 mm2. There is no thrombus in the left atrial appendage. The esophagus runs in the left atrial midline and is not in the proximity to any of the pulmonary veins. Aorta:  Normal caliber.  No dissection or calcifications. Aortic Valve:  Trileaflet.  No calcifications. Coronary Arteries: Normal coronary origin. Right dominance. The study was performed without use of NTG and insufficient for plaque evaluation. IMPRESSION: 1. There is normal pulmonary vein drainage into the left atrium. (2 on the right and 2 on the left) with ostial measurements as above. 2. The left atrial appendage is a chicken wing-cactus type with ostial size 29 x 20 mm and length 38 mm, Area 43 mm2. There is no thrombus in the left atrial  appendage. 3. The esophagus runs in the left atrial midline and is not in the proximity to any of the pulmonary veins. 4. Coronary calcium  score 247. This is 80th percentile for age/gender. Electronically Signed   By: Redell Cave M.D.   On: 08/20/2023 16:21   Disposition   Patient is being discharged home today in good condition.  Follow-up Plans & Appointments     Follow-up Information     Fenton, Clint R, PA Follow up.   Specialty: Cardiology Why: Hospital follow-up in the Atrial Fibrillation Clinic scheduled for 10/02/2023 at 11:30am. Please arrive 15 minutes early for  check-in. If this date/ time does not work for you, please call our office to reschedule. Contact information: [address] [place] [address] [phone]         Vivienne Lonni Ingle, NP Follow up.   Specialties: Cardiology, Radiology Why: Follow-up with General Cardiology scheduled for 09/24/2023 at 3:10pm. Please arrive 15 minutes early for check-in. If this date/ time does not work for you, please call our office to reschedule. Contact information: [address] [place] [address] [phone]                Discharge Instructions     Diet - low sodium heart healthy   Complete by: As directed    Increase activity slowly   Complete by: As directed         Discharge Medications   Allergies as of 09/05/2023       Reactions   Atorvastatin  Other (See Comments)   Muscle pain        Medication List     STOP taking these medications    aspirin  81 MG tablet   atorvastatin  40 MG tablet Commonly known as: LIPITOR       TAKE these medications    amiodarone  200 MG tablet Commonly known as: PACERONE  TAKE ONE TABLET (200 MG TOTAL) BY MOUTH DAILY. (NOTE DOSAGE CHANGE)   clonazePAM  0.5 MG tablet Commonly known as: KLONOPIN  Take 1 tablet (0.5 mg total) by mouth 2 (two) times daily as needed for anxiety. May dispense today, November 25th, 2024   colchicine  0.6 MG tablet Take 1 tablet (0.6 mg total) by mouth 2 (two) times daily for 5 days.   Eliquis  5 MG Tabs tablet Generic drug: apixaban  TAKE ONE TABLET (5 MG TOTAL) BY MOUTH TWO (TWO) TIMES DAILY.   Entresto  49-51 MG Generic drug: sacubitril -valsartan  TAKE ONE TABLET BY MOUTH TWO  TIMES DAILY.   folic acid  1 MG tablet Commonly known as: FOLVITE  TAKE ONE TABLET (1 MG TOTAL) BY MOUTH DAILY.   furosemide  40 MG tablet Commonly known as: LASIX  TAKE 1 TABLET BY MOUTH TWICE A DAY   gabapentin  600 MG tablet Commonly known as: NEURONTIN  Take 1 tablet (600 mg total) by mouth 3  (three) times daily as needed.   metoprolol  succinate 50 MG 24 hr tablet Commonly known as: TOPROL -XL Take 1 tablet (50 mg total) by mouth daily. Take with or immediately following a meal.   niacin 500 MG tablet Commonly known as: VITAMIN B3 Take 500 mg by mouth daily.   OMEGA-3 FATTY ACIDS PO Take 1,000 mg by mouth daily.   pantoprazole  40 MG tablet Commonly known as: Protonix  Take 1 tablet (40 mg total) by mouth daily.   polyethylene glycol 17 g packet Commonly known as: MIRALAX  / GLYCOLAX  Take 17 g by mouth daily as needed for mild constipation.   potassium chloride  SA  20 MEQ tablet Commonly known as: KLOR-CON  M TAKE 1 TABLET BY MOUTH TWICE A DAY   predniSONE  10 MG tablet Commonly known as: DELTASONE  Take 4 tablets (40 mg total) by mouth daily with breakfast for 1 day, THEN 3 tablets (30 mg total) daily with breakfast for 2 days, THEN 2 tablets (20 mg total) daily with breakfast for 2 days, THEN 1 tablet (10 mg total) daily with breakfast for 2 days. Start taking on: September 06, 2023   thiamine  100 MG tablet Commonly known as: Vitamin B-1 Take 1 tablet (100 mg total) by mouth daily.           Outstanding Labs/Studies   N/A.  Duration of Discharge Encounter   Greater than 30 minutes including physician time.  Signed, Eddy Liszewski E Tylan Briguglio, PA-C 09/05/2023, 10:33 AM

## 2023-09-07 ENCOUNTER — Encounter (HOSPITAL_COMMUNITY): Payer: Self-pay | Admitting: Cardiology

## 2023-09-08 MED FILL — Fentanyl Citrate Preservative Free (PF) Inj 100 MCG/2ML: INTRAMUSCULAR | Qty: 2 | Status: AC

## 2023-09-24 ENCOUNTER — Encounter: Payer: Self-pay | Admitting: Nurse Practitioner

## 2023-09-24 ENCOUNTER — Ambulatory Visit: Payer: Medicaid Other | Attending: Nurse Practitioner | Admitting: Nurse Practitioner

## 2023-09-24 VITALS — BP 136/88 | HR 51 | Ht 74.0 in | Wt 242.6 lb

## 2023-09-24 DIAGNOSIS — I4819 Other persistent atrial fibrillation: Secondary | ICD-10-CM

## 2023-09-24 DIAGNOSIS — I420 Dilated cardiomyopathy: Secondary | ICD-10-CM | POA: Diagnosis not present

## 2023-09-24 DIAGNOSIS — I5022 Chronic systolic (congestive) heart failure: Secondary | ICD-10-CM

## 2023-09-24 DIAGNOSIS — I483 Typical atrial flutter: Secondary | ICD-10-CM

## 2023-09-24 DIAGNOSIS — I10 Essential (primary) hypertension: Secondary | ICD-10-CM

## 2023-09-24 DIAGNOSIS — E785 Hyperlipidemia, unspecified: Secondary | ICD-10-CM

## 2023-09-24 DIAGNOSIS — I340 Nonrheumatic mitral (valve) insufficiency: Secondary | ICD-10-CM | POA: Diagnosis not present

## 2023-09-24 DIAGNOSIS — F1010 Alcohol abuse, uncomplicated: Secondary | ICD-10-CM

## 2023-09-24 MED ORDER — ROSUVASTATIN CALCIUM 5 MG PO TABS: 5.0000 mg | ORAL_TABLET | Freq: Every day | ORAL | 3 refills | Status: DC

## 2023-09-24 NOTE — Progress Notes (Signed)
Office Visit    Patient Name: Stephen [name] Date of Encounter: 09/24/2023  Primary Care Provider:  Malva Limes, MD Primary Cardiologist:  Lorine Bears, MD  Chief Complaint    63 y.o. male with a history of persistent atrial fibrillation, typical atrial flutter, cardiomyopathy (EF 20 to 25% July 2024), hypertension, hyperlipidemia, moderate mitral regurgitation, chronic back pain, anxiety, and alcohol use, presents for follow-up related to atrial fibrillation and recent PVI.  Past Medical History  Subjective   Past Medical History:  Diagnosis Date   Alcohol abuse    a. Significant reduction following Afib/cardiomyopathy diagnosis in July 2024.   Aortic valve sclerosis    Atrial flutter (HCC)    a. 09/2023 s/p RFCA.   Cardiomyopathy (HCC)    a. 03/2023 Echo: EF 20-25%, sev dil LV, mod LVH, mildly reduced RV fxn, mildly dil LA, sev dil RA, mild-mod MR. Ao sclerosis; b. 03/2023 TEE: EF 20-25%, glob HK, no LAA thrombus, mod MR, mod TR, Ao sclerosis.   Coronary artery calcification seen on CT scan    a. 08/2023 Cardiac CT: Ca2+ = 247 (80th percentile).   Hyperlipidemia    Hypertension    Moderate mitral regurgitation    a. 03/2023 TEE: Mod MR.   Persistent atrial fibrillation (HCC)    a. 03/2023 s/p TEE/DCCV; b. 05/2023 s/p DCCV; c. 09/2023 s/p PVI.   Past Surgical History:  Procedure Laterality Date   ATRIAL FIBRILLATION ABLATION N/A 09/04/2023   Procedure: ATRIAL FIBRILLATION ABLATION;  Surgeon: Lanier Prude, MD;  Location: [facility];  Service: Cardiovascular;  Laterality: N/A;   CARDIOVERSION N/A 03/06/2023   Procedure: CARDIOVERSION;  Surgeon: Antonieta Iba, MD;  Location: [facility] ORS;  Service: Cardiovascular;  Laterality: N/A;   CARDIOVERSION N/A 05/05/2023   Procedure: CARDIOVERSION;  Surgeon: Antonieta Iba, MD;  Location: [facility] ORS;  Service: Cardiovascular;  Laterality: N/A;   COLONOSCOPY  10/31/2013   TEE WITHOUT CARDIOVERSION N/A 03/06/2023   Procedure:  TRANSESOPHAGEAL ECHOCARDIOGRAM;  Surgeon: Antonieta Iba, MD;  Location: [facility] ORS;  Service: Cardiovascular;  Laterality: N/A;   TONSILLECTOMY AND ADENOIDECTOMY      Allergies  Allergies  Allergen Reactions   Atorvastatin Other (See Comments)    Muscle pain      History of Present Illness      63 y.o. y/o male with a history of persistent atrial fibrillation, typical atrial flutter, cardiomyopathy (EF 20 to 25% July 2024), hypertension, hyperlipidemia, moderate mitral regurgitation, chronic back pain, anxiety, and alcohol use.  He was admitted to the [facility] in July 2024 in the setting of heart failure and rapid A-fib.  Echo showed an EF of 20 to 25% with global hypokinesis and moderate MR.  He underwent TEE and cardioversion, and subsequently placed on amiodarone.  At return office visit in August 2024, he was noted to be in atrial flutter and underwent redo cardioversion in September 2024.  He established care with Dr. Lalla Brothers in November 2024 at which time he was maintaining sinus rhythm.  He subsequently underwent A-fib ablation/pulmonary vein isolation and flutter ablation in January 2025.    Since his PVI/flutter ablation, he has felt exceptionally well.  He has not noted any palpitations.  Though he previously drank 12 beers a day, he is currently drinking 6-7 beers a week and hopes to completely quit.  He denies chest pain, dyspnea, PND, orthopnea, dizziness, syncope, edema, or early satiety.  He previously experienced myalgias on atorvastatin  but is interested in trying rosuvastatin. Objective  Home Medications    Current Outpatient Medications  Medication Sig Dispense Refill   amiodarone (PACERONE) 200 MG tablet TAKE ONE TABLET (200 MG TOTAL) BY MOUTH DAILY. (NOTE DOSAGE CHANGE) 30 tablet 3   clonazePAM (KLONOPIN) 0.5 MG tablet Take 1 tablet (0.5 mg total) by mouth 2 (two) times daily as needed for anxiety. May dispense today, November 25th, 2024 60  tablet 2   colchicine 0.6 MG tablet Take 1 tablet (0.6 mg total) by mouth 2 (two) times daily for 5 days. 10 tablet 0   ELIQUIS 5 MG TABS tablet TAKE ONE TABLET (5 MG TOTAL) BY MOUTH TWO (TWO) TIMES DAILY. 60 tablet 3   folic acid (FOLVITE) 1 MG tablet TAKE ONE TABLET (1 MG TOTAL) BY MOUTH DAILY. 90 tablet 1   furosemide (LASIX) 40 MG tablet TAKE 1 TABLET BY MOUTH TWICE A DAY 60 tablet 2   gabapentin (NEURONTIN) 600 MG tablet Take 1 tablet (600 mg total) by mouth 3 (three) times daily as needed. 90 tablet 3   metoprolol succinate (TOPROL-XL) 50 MG 24 hr tablet Take 1 tablet (50 mg total) by mouth daily. Take with or immediately following a meal. 90 tablet 3   niacin 500 MG tablet Take 500 mg by mouth daily.     OMEGA-3 FATTY ACIDS PO Take 1,000 mg by mouth daily.     pantoprazole (PROTONIX) 40 MG tablet Take 1 tablet (40 mg total) by mouth daily. 45 tablet 0   polyethylene glycol (MIRALAX / GLYCOLAX) 17 g packet Take 17 g by mouth daily as needed for mild constipation. 14 each 0   potassium chloride SA (KLOR-CON M) 20 MEQ tablet TAKE 1 TABLET BY MOUTH TWICE A DAY 60 tablet 2   rosuvastatin (CRESTOR) 5 MG tablet Take 1 tablet (5 mg total) by mouth daily. 90 tablet 3   sacubitril-valsartan (ENTRESTO) 49-51 MG TAKE ONE TABLET BY MOUTH TWO  TIMES DAILY. 180 tablet 1   thiamine (VITAMIN B-1) 100 MG tablet Take 1 tablet (100 mg total) by mouth daily. 30 tablet 0   No current facility-administered medications for this visit.     Physical Exam    VS:  BP 136/88   Pulse (!) 51   Ht 6\' 2"  (1.88 m)   Wt 242 lb 9.6 oz (110 kg)   SpO2 96%   BMI 31.15 kg/m  , BMI Body mass index is 31.15 kg/m.       GEN: Well nourished, well developed, in no acute distress. HEENT: normal. Neck: Supple, no JVD, carotid bruits, or masses. Cardiac: RRR, no murmurs, rubs, or gallops. No clubbing, cyanosis, edema.  Radials 2+/PT 2+ and equal bilaterally.  Respiratory:  Respirations regular and unlabored, clear to  auscultation bilaterally. GI: Soft, nontender, nondistended, BS + x 4. MS: no deformity or atrophy. Skin: warm and dry, no rash. Neuro:  Strength and sensation are intact. Psych: Normal affect.  Accessory Clinical Findings    ECG personally reviewed by me today - EKG Interpretation Date/Time:  Thursday September 24 2023 15:28:08 EST Ventricular Rate:  51 PR Interval:  172 QRS Duration:  104 QT Interval:  438 QTC Calculation: 403 R Axis:   -28  Text Interpretation: Sinus bradycardia with sinus arrhythmia Minimal voltage criteria for LVH, may be normal variant ( Cornell product ) Confirmed by Nicolasa Ducking [phone]) on 09/24/2023 4:03:40 PM  - no acute changes.  Lab Results  Component Value Date   WBC  5.0 08/19/2023   HGB 14.9 08/19/2023   HCT 45.1 08/19/2023   MCV 90 08/19/2023   PLT 147 (L) 08/19/2023   Lab Results  Component Value Date   CREATININE 0.89 08/19/2023   BUN 12 08/19/2023   NA 141 08/19/2023   K 4.0 08/19/2023   CL 101 08/19/2023   CO2 28 08/19/2023   Lab Results  Component Value Date   ALT 24 08/19/2023   AST 19 08/19/2023   ALKPHOS 73 08/19/2023   BILITOT 0.6 08/19/2023   Lab Results  Component Value Date   CHOL 236 (H) 11/19/2022   HDL 49 11/19/2022   LDLCALC 147 (H) 11/19/2022   LDLDIRECT 84 03/27/2023   TRIG 222 (H) 11/19/2022   CHOLHDL 2.8 01/18/2020    Lab Results  Component Value Date   TSH 2.010 08/19/2023       Assessment & Plan    1.  Persistent atrial fibrillation/typical atrial flutter: Status post PVI earlier this month.  Maintaining sinus rhythm and notes that he feels exceptionally well with exception of chronic low back pain.  He remains on amiodarone 200 mg daily and is anticoagulated with Eliquis 5 mg twice daily in the setting of a CHA2DS2-VASc of 3 (CHF, hypertension, coronary calcium).  2.  Typical atrial flutter: Noted in August 2024 status post cardioversion in September 2024.  Now status post catheter ablation  without known recurrence.  Remains on amiodarone and anticoagulated on Eliquis as outlined above.  3.  Cardiomyopathy/chronic HFrEF: EF of 20 to 25% in July 2024 in the setting of rapid atrial fibrillation.  He has been managed with GDMT including Toprol-XL and Entresto.  He is also on Lasix 40 mg twice daily with normal electrolytes and renal function in December 2024.  He is euvolemic on examination today and asymptomatic at home.  He has not undergone ischemic evaluation up to this point.  I will arrange for a follow-up echocardiogram in approximately 4 to 6 weeks, now that he is in sinus rhythm.  If he has persistent LV dysfunction, he will require additional optimization of medical therapy and ischemic evaluation.  4.  Primary hypertension: Stable on beta-blocker, Entresto, and diuretic.  5.  Coronary calcium: Noted on CT in December 2024 with a calcium score of 247 (80th percentile).  Not currently on statin as he developed myalgias on atorvastatin in the past.  Willing to try low-dose rosuvastatin 5 mg daily ordered today.  Will arrange for follow-up lipids and complete metabolic panel in 4 to 6 weeks, when he returns for echo.  6.  Hyperlipidemia: See #5.  Adding low-dose rosuvastatin with plan for follow-up labs.  Patient knows to contact us if he has recurrent myalgias on statin therapy (did not tolerate atorvastatin).  7.  Alcohol abuse: Previously drank a 12 pack of beer a day.  Down to 1 beer a day.  Says he is working hard and does plan to quit at some point.  I congratulated him on his success thus far.  Cessation advised.  8.  Moderate mitral regurgitation: Noted on TEE in July 2024.  Follow-up echo pending as outlined above.  9.  Disposition: Follow-up echocardiogram and complete metabolic panel/lipids in approximately 4 to 6 weeks.  He has EP follow-up in April and we will plan to see him back in general cardiology clinic in 3 months.  Nicolasa Ducking, NP 09/24/2023, 6:12 PM

## 2023-09-24 NOTE — Patient Instructions (Signed)
Medication Instructions:  START Rosuvastatin 5 mg once daily  *If you need a refill on your cardiac medications before your next appointment, please call your pharmacy*   Lab Work: Your provider would like for you to return when you have your echo to have the following labs drawn: fasting Lipid, liver and CMET.   Please go to [facility] [address] ([facility] [address] [facility]) [address], [place] [address] You do not need an appointment.  They are open from 8 am- 4:30 pm.  Lunch from 1:00 pm- 2:00 pm You will need to be fasting.  If you have labs (blood work) drawn today and your tests are completely normal, you will receive your results only by: MyChart Message (if you have MyChart) OR A paper copy in the mail If you have any lab test that is abnormal or we need to change your treatment, we will call you to review the results.   Testing/Procedures: Your physician has requested that you have an echocardiogram in 6 weeks. Echocardiography is a painless test that uses sound waves to create images of your heart. It provides your doctor with information about the size and shape of your heart and how well your heart's chambers and valves are working.   You may receive an ultrasound enhancing agent through an IV if needed to better visualize your heart during the echo. This procedure takes approximately one hour.  There are no restrictions for this procedure.  This will take place at [address] [facility]) [address], [place] [address]  Please note: We ask at that you not bring children with you during ultrasound (echo/ vascular) testing. Due to room size and safety concerns, children are not allowed in the ultrasound rooms during exams. Our front office staff cannot provide observation of children in our lobby area while testing is being conducted. An adult accompanying a patient to their appointment will only be allowed in the ultrasound room at the discretion of the  ultrasound technician under special circumstances. We apologize for any inconvenience.    Follow-Up: At [facility], you and your health needs are our priority.  As part of our continuing mission to provide you with exceptional heart care, we have created designated Provider Care Teams.  These Care Teams include your primary Cardiologist (physician) and Advanced Practice Providers (APPs -  Physician Assistants and Nurse Practitioners) who all work together to provide you with the care you need, when you need it.  We recommend signing up for the patient portal called "MyChart".  Sign up information is provided on this After Visit Summary.  MyChart is used to connect with patients for Virtual Visits (Telemedicine).  Patients are able to view lab/test results, encounter notes, upcoming appointments, etc.  Non-urgent messages can be sent to your provider as well.   To learn more about what you can do with MyChart, go to [email or web address].    Your next appointment:   3 month(s)  Provider:   You may see Lorine Bears, MD or one of the following Advanced Practice Providers on your designated Care Team:   Nicolasa Ducking, NP

## 2023-10-02 ENCOUNTER — Ambulatory Visit (HOSPITAL_COMMUNITY)
Admission: RE | Admit: 2023-10-02 | Discharge: 2023-10-02 | Disposition: A | Discharge status: Home / Self Care | Payer: Medicaid Other | Source: Ambulatory Visit | Attending: Physician Assistant | Admitting: Physician Assistant

## 2023-10-02 ENCOUNTER — Encounter (HOSPITAL_COMMUNITY): Payer: Self-pay | Admitting: Physician Assistant

## 2023-10-02 VITALS — BP 154/100 | HR 61 | Ht 74.0 in | Wt 246.4 lb

## 2023-10-02 DIAGNOSIS — I5022 Chronic systolic (congestive) heart failure: Secondary | ICD-10-CM | POA: Diagnosis not present

## 2023-10-02 DIAGNOSIS — Z7901 Long term (current) use of anticoagulants: Secondary | ICD-10-CM | POA: Insufficient documentation

## 2023-10-02 DIAGNOSIS — Z5181 Encounter for therapeutic drug level monitoring: Secondary | ICD-10-CM | POA: Diagnosis not present

## 2023-10-02 DIAGNOSIS — Z79899 Other long term (current) drug therapy: Secondary | ICD-10-CM | POA: Diagnosis not present

## 2023-10-02 DIAGNOSIS — I4892 Unspecified atrial flutter: Secondary | ICD-10-CM | POA: Diagnosis not present

## 2023-10-02 DIAGNOSIS — D6869 Other thrombophilia: Secondary | ICD-10-CM | POA: Diagnosis not present

## 2023-10-02 DIAGNOSIS — I110 Hypertensive heart disease with heart failure: Secondary | ICD-10-CM | POA: Diagnosis not present

## 2023-10-02 DIAGNOSIS — I2510 Atherosclerotic heart disease of native coronary artery without angina pectoris: Secondary | ICD-10-CM | POA: Insufficient documentation

## 2023-10-02 DIAGNOSIS — E785 Hyperlipidemia, unspecified: Secondary | ICD-10-CM | POA: Insufficient documentation

## 2023-10-02 DIAGNOSIS — I4819 Other persistent atrial fibrillation: Secondary | ICD-10-CM | POA: Diagnosis not present

## 2023-10-02 NOTE — Progress Notes (Signed)
Primary Care Physician: Malva Limes, MD Primary Cardiologist: Lorine Bears, MD Electrophysiologist: Lanier Prude, MD  Referring Physician: Dr Jerilynn Birkenhead Annas is a 63 y.o. male with a history of HFrEF, HTN, HLD, CAD, VHD, atrial flutter, atrial fibrillation who presents for follow up in the [facility].  Patient was hospitalized July 2024 with decompensated HF in the setting of afib. His EF was reduced and was thought to be secondary to tachycardia mediated cardiomyopathy. He was started on amiodarone and underwent cardioversion. He also had atrial flutter and underwent DCCV on 05/05/23. Patient is on Eliquis for stroke prevention. He was seen by Dr Lalla Brothers and underwent afib and flutter ablation on 09/04/23.  Patient presents today for follow up for atrial fibrillation. He reports that he has done "great" since the procedure. He remains in SR. He denies chest pain or groin issues. No bleeding issues on anticoagulation.   Today, he denies symptoms of palpitations, chest pain, shortness of breath, orthopnea, PND, lower extremity edema, dizziness, presyncope, syncope, snoring, daytime somnolence, bleeding, or neurologic sequela. The patient is tolerating medications without difficulties and is otherwise without complaint today.    Atrial Fibrillation Risk Factors:  he does not have symptoms or diagnosis of sleep apnea. he does not have a history of rheumatic fever. he does have a history of alcohol use.   Atrial Fibrillation Management history:  Previous antiarrhythmic drugs: amiodarone  Previous cardioversions: 03/06/23, 05/05/23 Previous ablations: 09/04/23 Anticoagulation history: Eliquis  ROS- All systems are reviewed and negative except Stephen [name] per the HPI above.  Past Medical History:  Diagnosis Date   Alcohol abuse    a. Significant reduction following Afib/cardiomyopathy diagnosis in July 2024.   Aortic valve sclerosis    Atrial flutter  (HCC)    a. 09/2023 s/p RFCA.   Cardiomyopathy (HCC)    a. 03/2023 Echo: EF 20-25%, sev dil LV, mod LVH, mildly reduced RV fxn, mildly dil LA, sev dil RA, mild-mod MR. Ao sclerosis; b. 03/2023 TEE: EF 20-25%, glob HK, no LAA thrombus, mod MR, mod TR, Ao sclerosis.   Coronary artery calcification seen on CT scan    a. 08/2023 Cardiac CT: Ca2+ = 247 (80th percentile).   Hyperlipidemia    Hypertension    Moderate mitral regurgitation    a. 03/2023 TEE: Mod MR.   Persistent atrial fibrillation (HCC)    a. 03/2023 s/p TEE/DCCV; b. 05/2023 s/p DCCV; c. 09/2023 s/p PVI.    Current Outpatient Medications  Medication Sig Dispense Refill   amiodarone (PACERONE) 200 MG tablet TAKE ONE TABLET (200 MG TOTAL) BY MOUTH DAILY. (NOTE DOSAGE CHANGE) 30 tablet 3   clonazePAM (KLONOPIN) 0.5 MG tablet Take 1 tablet (0.5 mg total) by mouth 2 (two) times daily Stephen [name] needed for anxiety. May dispense today, November 25th, 2024 60 tablet 2   colchicine 0.6 MG tablet Take 1 tablet (0.6 mg total) by mouth 2 (two) times daily for 5 days. 10 tablet 0   ELIQUIS 5 MG TABS tablet TAKE ONE TABLET (5 MG TOTAL) BY MOUTH TWO (TWO) TIMES DAILY. 60 tablet 3   folic acid (FOLVITE) 1 MG tablet TAKE ONE TABLET (1 MG TOTAL) BY MOUTH DAILY. 90 tablet 1   furosemide (LASIX) 40 MG tablet TAKE 1 TABLET BY MOUTH TWICE A DAY 60 tablet 2   gabapentin (NEURONTIN) 600 MG tablet Take 1 tablet (600 mg total) by mouth 3 (three) times daily Stephen [name] needed. 90 tablet 3  metoprolol succinate (TOPROL-XL) 50 MG 24 hr tablet Take 1 tablet (50 mg total) by mouth daily. Take with or immediately following a meal. 90 tablet 3   niacin 500 MG tablet Take 500 mg by mouth daily.     OMEGA-3 FATTY ACIDS PO Take 1,000 mg by mouth daily.     pantoprazole (PROTONIX) 40 MG tablet Take 1 tablet (40 mg total) by mouth daily. 45 tablet 0   polyethylene glycol (MIRALAX / GLYCOLAX) 17 g packet Take 17 g by mouth daily Stephen [name] needed for mild constipation. 14 each 0   potassium  chloride SA (KLOR-CON M) 20 MEQ tablet TAKE 1 TABLET BY MOUTH TWICE A DAY 60 tablet 2   rosuvastatin (CRESTOR) 5 MG tablet Take 1 tablet (5 mg total) by mouth daily. 90 tablet 3   sacubitril-valsartan (ENTRESTO) 49-51 MG TAKE ONE TABLET BY MOUTH TWO  TIMES DAILY. 180 tablet 1   thiamine (VITAMIN B-1) 100 MG tablet Take 1 tablet (100 mg total) by mouth daily. 30 tablet 0   No current facility-administered medications for this encounter.    Physical Exam: BP (!) 154/100   Pulse 61   Ht 6\' 2"  (1.88 m)   Wt 111.8 kg   BMI 31.64 kg/m   GEN: Well nourished, well developed in no acute distress NECK: No JVD; No carotid bruits CARDIAC: Regular rate and rhythm, no murmurs, rubs, gallops RESPIRATORY:  Clear to auscultation without rales, wheezing or rhonchi  ABDOMEN: Soft, non-tender, non-distended EXTREMITIES:  No edema; No deformity   Wt Readings from Last 3 Encounters:  10/02/23 111.8 kg  09/24/23 110 kg  09/04/23 110.9 kg     EKG today demonstrates  SR Vent. rate 61 BPM PR interval 174 ms QRS duration 102 ms QT/QTcB 414/416 ms   Echo 03/03/23 demonstrated   1. Left ventricular ejection fraction, by estimation, is 20 to 25%. The  left ventricle has severely decreased function. The left ventricle  demonstrates regional wall motion abnormalities (see scoring  diagram/findings for description). The left ventricular internal cavity size was moderately to severely dilated. There is moderate left ventricular hypertrophy. Left ventricular diastolic parameters are indeterminate.   2. Right ventricular systolic function is mildly reduced. The right  ventricular size is moderately enlarged.   3. Left atrial size was mildly dilated.   4. Right atrial size was severely dilated.   5. The mitral valve is normal in structure. Mild to moderate mitral valve  regurgitation.   6. The aortic valve has an indeterminant number of cusps. There is mild  thickening of the aortic valve. Aortic valve  regurgitation is not  visualized. Aortic valve sclerosis is present, with no evidence of aortic  valve stenosis.    CHA2DS2-VASc Score = 3  The patient's score is based upon: CHF History: 1 HTN History: 1 Diabetes History: 0 Stroke History: 0 Vascular Disease History: 1 Age Score: 0 Gender Score: 0       ASSESSMENT AND PLAN: Persistent Atrial Fibrillation/atrial flutter The patient's CHA2DS2-VASc score is 3, indicating a 3.2% annual risk of stroke.   S/p afib and flutter ablation 09/04/23 Patient appears to be maintaining SR. Continue Eliquis 5 mg BID with no missed doses for 3 months post ablation. Continue amiodarone 200 mg daily for now Continue Toprol 50 mg daily  Secondary Hypercoagulable State (ICD10:  D68.69) The patient is at significant risk for stroke/thromboembolism based upon his CHA2DS2-VASc Score of 3.  Continue Apixaban (Eliquis).   High Risk Medication Monitoring (ICD  10: Z79.899) Intervals on ECG appropriate for amiodarone monitoring.   Chronic HFrEF EF 20-25% GDMT per primary cardiology team Fluid status appears stable Repeat echo scheduled for 11/05/23. Hopefully his EF has improved now that he is in SR.  HTN Elevated today, better controlled at previous visits. No changes today.   CAD CAC score 247 on CT No anginal symptoms On statin   Follow up with Sherie Don Stephen [name] scheduled.        Stephen Loa PA-C [facility] [address] [place], [place] [address] [phone]

## 2023-10-03 DIAGNOSIS — Z419 Encounter for procedure for purposes other than remedying health state, unspecified: Secondary | ICD-10-CM | POA: Diagnosis not present

## 2023-10-12 ENCOUNTER — Other Ambulatory Visit: Payer: Medicaid Other

## 2023-10-23 ENCOUNTER — Ambulatory Visit (INDEPENDENT_AMBULATORY_CARE_PROVIDER_SITE_OTHER): Payer: Medicaid Other | Admitting: Family Medicine

## 2023-10-23 VITALS — BP 142/82 | HR 50 | Temp 97.9°F | Ht 72.0 in | Wt 247.0 lb

## 2023-10-23 DIAGNOSIS — R7989 Other specified abnormal findings of blood chemistry: Secondary | ICD-10-CM

## 2023-10-23 DIAGNOSIS — E782 Mixed hyperlipidemia: Secondary | ICD-10-CM | POA: Diagnosis not present

## 2023-10-23 DIAGNOSIS — I5022 Chronic systolic (congestive) heart failure: Secondary | ICD-10-CM

## 2023-10-23 DIAGNOSIS — I4819 Other persistent atrial fibrillation: Secondary | ICD-10-CM | POA: Diagnosis not present

## 2023-10-23 DIAGNOSIS — I10 Essential (primary) hypertension: Secondary | ICD-10-CM | POA: Diagnosis not present

## 2023-10-23 DIAGNOSIS — G8929 Other chronic pain: Secondary | ICD-10-CM

## 2023-10-23 DIAGNOSIS — M545 Low back pain, unspecified: Secondary | ICD-10-CM

## 2023-10-23 DIAGNOSIS — F439 Reaction to severe stress, unspecified: Secondary | ICD-10-CM | POA: Diagnosis not present

## 2023-10-23 MED ORDER — APIXABAN 5 MG PO TABS: 5.0000 mg | ORAL_TABLET | Freq: Two times a day (BID) | ORAL | 1 refills | Status: DC

## 2023-10-23 MED ORDER — POTASSIUM CHLORIDE CRYS ER 20 MEQ PO TBCR: 20.0000 meq | EXTENDED_RELEASE_TABLET | Freq: Two times a day (BID) | ORAL | 5 refills | Status: DC

## 2023-10-23 MED ORDER — CLONAZEPAM 0.5 MG PO TABS
0.5000 mg | ORAL_TABLET | Freq: Two times a day (BID) | ORAL | 2 refills | Status: DC | PRN
Start: 2023-10-23 — End: 2024-01-19

## 2023-10-23 MED ORDER — GABAPENTIN 600 MG PO TABS: 600.0000 mg | ORAL_TABLET | Freq: Three times a day (TID) | ORAL | 11 refills | Status: AC | PRN

## 2023-10-23 MED ORDER — FUROSEMIDE 40 MG PO TABS: 40.0000 mg | ORAL_TABLET | Freq: Two times a day (BID) | ORAL | 5 refills | Status: DC

## 2023-10-23 NOTE — Progress Notes (Signed)
 Established patient visit   Patient: Stephen [name]   DOB: 01-Jun-1961   63 y.o. Male  MRN: [id number] Visit Date: 10/23/2023  Today's healthcare provider: Mila Merry, MD   Chief Complaint  Patient presents with   Medication Management    Patient presents for follow up and refill of Clonazepam.  He states he takes 1 at night for sleep   Subjective    HPI HPI     Medication Management    Additional comments: Patient presents for follow up and refill of Clonazepam.  He states he takes 1 at night for sleep      Last edited by Adline Peals, CMA on 10/23/2023  9:15 AM.      Patient presents for follow up clonazepam which he takes every night to help sleep and having no adverse effects. He is also being followed by cardiology for a-fib with recent ablation which he reports went well. He continues rosuvastatin for lipids, and on entresto. Metoprolol and furosemide for CHF. He continues on gabapentin for back pain and sciatica on which he feels he is doing well.   Lab Results  Component Value Date   CHOL 236 (H) 11/19/2022   HDL 49 11/19/2022   LDLCALC 147 (H) 11/19/2022   LDLDIRECT 84 03/27/2023   TRIG 222 (H) 11/19/2022   CHOLHDL 2.8 01/18/2020   Last metabolic panel Lab Results  Component Value Date   GLUCOSE 96 08/19/2023   NA 141 08/19/2023   K 4.0 08/19/2023   CL 101 08/19/2023   CO2 28 08/19/2023   BUN 12 08/19/2023   CREATININE 0.89 08/19/2023   EGFR 97 08/19/2023   CALCIUM 9.0 08/19/2023   PROT 6.7 08/19/2023   ALBUMIN 4.0 08/19/2023   LABGLOB 2.7 08/19/2023   AGRATIO 1.5 11/19/2022   BILITOT 0.6 08/19/2023   ALKPHOS 73 08/19/2023   AST 19 08/19/2023   ALT 24 08/19/2023   ANIONGAP 10 03/07/2023     Medications: Outpatient Medications Prior to Visit  Medication Sig   amiodarone (PACERONE) 200 MG tablet TAKE ONE TABLET (200 MG TOTAL) BY MOUTH DAILY. (NOTE DOSAGE CHANGE)   colchicine 0.6 MG tablet Take 1 tablet (0.6 mg total) by mouth 2  (two) times daily for 5 days.   folic acid (FOLVITE) 1 MG tablet TAKE ONE TABLET (1 MG TOTAL) BY MOUTH DAILY.   metoprolol succinate (TOPROL-XL) 50 MG 24 hr tablet Take 1 tablet (50 mg total) by mouth daily. Take with or immediately following a meal.   niacin 500 MG tablet Take 500 mg by mouth daily.   OMEGA-3 FATTY ACIDS PO Take 1,000 mg by mouth daily.   polyethylene glycol (MIRALAX / GLYCOLAX) 17 g packet Take 17 g by mouth daily as needed for mild constipation.   rosuvastatin (CRESTOR) 5 MG tablet Take 1 tablet (5 mg total) by mouth daily.   sacubitril-valsartan (ENTRESTO) 49-51 MG TAKE ONE TABLET BY MOUTH TWO  TIMES DAILY.   thiamine (VITAMIN B-1) 100 MG tablet Take 1 tablet (100 mg total) by mouth daily.   clonazePAM (KLONOPIN) 0.5 MG tablet Take 1 tablet (0.5 mg total) by mouth 2 (two) times daily as needed for anxiety. May dispense today, November 25th, 2024   ELIQUIS 5 MG TABS tablet TAKE ONE TABLET (5 MG TOTAL) BY MOUTH TWO (TWO) TIMES DAILY.   furosemide (LASIX) 40 MG tablet TAKE 1 TABLET BY MOUTH TWICE A DAY   gabapentin (NEURONTIN) 600 MG tablet Take 1 tablet (  600 mg total) by mouth 3 (three) times daily as needed.   potassium chloride SA (KLOR-CON M) 20 MEQ tablet TAKE 1 TABLET BY MOUTH TWICE A DAY   pantoprazole (PROTONIX) 40 MG tablet Take 1 tablet (40 mg total) by mouth daily.   No facility-administered medications prior to visit.    Review of Systems  Constitutional:  Negative for appetite change, chills and fever.  Respiratory:  Negative for chest tightness, shortness of breath and wheezing.   Cardiovascular:  Negative for chest pain and palpitations.  Gastrointestinal:  Negative for abdominal pain, nausea and vomiting.       Objective    BP (!) 142/82 (BP Location: Left Arm, Patient Position: Sitting, Cuff Size: Large)   Pulse (!) 50   Temp 97.9 F (36.6 C) (Oral)   Ht 6' (1.829 m)   Wt 247 lb (112 kg)   SpO2 99%   BMI 33.50 kg/m    Physical Exam    General: Appearance:    Mildly obese male in no acute distress  Eyes:    PERRL, conjunctiva/corneas clear, EOM's intact       Lungs:     Clear to auscultation bilaterally, respirations unlabored  Heart:    Bradycardic. Regular rhythm. No murmurs, rubs, or gallops.    MS:   All extremities are intact.    Neurologic:   Awake, alert, oriented x 3. No apparent focal neurological defect.         Assessment & Plan     1. Situational stress (Primary) Mainly taking clonazepam to rest at night and working well. refill clonazePAM (KLONOPIN) 0.5 MG tablet; Take 1 tablet (0.5 mg total) by mouth 2 (two) times daily as needed for anxiety.  Dispense: 60 tablet; Refill: 2  2. Mixed hyperlipidemia He is tolerating rosuvastatin well with no adverse effects.   - CBC - Lipid panel  3. Chronic bilateral low back pain without sciatica refill gabapentin (NEURONTIN) 600 MG tablet; Take 1 tablet (600 mg total) by mouth 3 (three) times daily as needed.  Dispense: 90 tablet; Refill: 11  4. Persistent atrial fibrillation (HCC) Recent ablation appears to be successful. Still on apixaban (ELIQUIS) 5 MG TABS tablet; Take 1 tablet (5 mg total) by mouth 2 (two) times daily.  Dispense: 60 tablet; Refill: 1 and to discuss continued anticoagulation at upcoming cardiology follow up.   5. Chronic HFrEF (heart failure with reduced ejection fraction) (HCC) Well compensated. Continue current medications.  Refill" - furosemide (LASIX) 40 MG tablet; Take 1 tablet (40 mg total) by mouth 2 (two) times daily.  Dispense: 60 tablet; Refill: 5 - potassium chloride SA (KLOR-CON M) 20 MEQ tablet; Take 1 tablet (20 mEq total) by mouth 2 (two) times daily.  Dispense: 60 tablet; Refill: 5  6. Primary hypertension Well controlled.  Continue current medications.    7. Elevated LFTs  - Comprehensive metabolic panel         Mila Merry, MD  [facility] [phone] (phone) [phone]  (fax)  [facility]

## 2023-10-24 LAB — CBC
Hematocrit: 44.2 % (ref 37.5–51.0)
Hemoglobin: 15.1 g/dL (ref 13.0–17.7)
MCH: 30 pg (ref 26.6–33.0)
MCHC: 34.2 g/dL (ref 31.5–35.7)
MCV: 88 fL (ref 79–97)
Platelets: 164 10*3/uL (ref 150–450)
RBC: 5.04 x10E6/uL (ref 4.14–5.80)
RDW: 11.7 % (ref 11.6–15.4)
WBC: 5.4 10*3/uL (ref 3.4–10.8)

## 2023-10-24 LAB — LIPID PANEL
Chol/HDL Ratio: 4.7 ratio (ref 0.0–5.0)
Cholesterol, Total: 243 mg/dL — ABNORMAL HIGH (ref 100–199)
HDL: 52 mg/dL (ref >39)
LDL Chol Calc (NIH): 168 mg/dL — ABNORMAL HIGH (ref 0–99)
Triglycerides: 130 mg/dL (ref 0–149)
VLDL Cholesterol Cal: 23 mg/dL (ref 5–40)

## 2023-10-24 LAB — COMPREHENSIVE METABOLIC PANEL
ALT: 21 IU/L (ref 0–44)
AST: 17 IU/L (ref 0–40)
Albumin: 4.1 g/dL (ref 3.9–4.9)
Alkaline Phosphatase: 65 IU/L (ref 44–121)
BUN/Creatinine Ratio: 12 (ref 10–24)
BUN: 12 mg/dL (ref 8–27)
Bilirubin Total: 0.4 mg/dL (ref 0.0–1.2)
CO2: 24 mmol/L (ref 20–29)
Calcium: 9 mg/dL (ref 8.6–10.2)
Chloride: 105 mmol/L (ref 96–106)
Creatinine, Ser: 0.98 mg/dL (ref 0.76–1.27)
Globulin, Total: 2.5 g/dL (ref 1.5–4.5)
Glucose: 92 mg/dL (ref 70–99)
Potassium: 4.1 mmol/L (ref 3.5–5.2)
Sodium: 145 mmol/L — ABNORMAL HIGH (ref 134–144)
Total Protein: 6.6 g/dL (ref 6.0–8.5)
eGFR: 88 mL/min/{1.73_m2} (ref >59)

## 2023-10-25 ENCOUNTER — Other Ambulatory Visit: Payer: Self-pay | Admitting: Family Medicine

## 2023-10-25 ENCOUNTER — Encounter: Payer: Self-pay | Admitting: Family Medicine

## 2023-10-25 DIAGNOSIS — E782 Mixed hyperlipidemia: Secondary | ICD-10-CM

## 2023-10-25 MED ORDER — ROSUVASTATIN CALCIUM 10 MG PO TABS
10.0000 mg | ORAL_TABLET | Freq: Every day | ORAL | 1 refills | Status: DC
Start: 2023-10-25 — End: 2023-12-03

## 2023-10-31 DIAGNOSIS — Z419 Encounter for procedure for purposes other than remedying health state, unspecified: Secondary | ICD-10-CM | POA: Diagnosis not present

## 2023-11-04 ENCOUNTER — Encounter: Payer: Self-pay | Admitting: Family Medicine

## 2023-11-04 DIAGNOSIS — E782 Mixed hyperlipidemia: Secondary | ICD-10-CM

## 2023-11-05 ENCOUNTER — Ambulatory Visit: Payer: Medicaid Other

## 2023-11-12 MED ORDER — BEMPEDOIC ACID 180 MG PO TABS: 180.0000 mg | ORAL_TABLET | Freq: Every day | ORAL | 5 refills | Status: DC

## 2023-11-16 ENCOUNTER — Telehealth: Payer: Self-pay

## 2023-11-16 ENCOUNTER — Other Ambulatory Visit (HOSPITAL_COMMUNITY): Payer: Self-pay

## 2023-11-16 NOTE — Telephone Encounter (Signed)
 Pharmacy Patient Advocate Encounter   Received notification from CoverMyMeds that prior authorization for Nexletol 180MG  tablets is required/requested.   Insurance verification completed.   The patient is insured through [facility] [place] .   Per test claim: PA required; PA submitted to above mentioned insurance via CoverMyMeds Key/confirmation #/EOC (Key: [id number]) Status is pending

## 2023-11-17 ENCOUNTER — Other Ambulatory Visit (HOSPITAL_COMMUNITY): Payer: Self-pay

## 2023-11-17 NOTE — Telephone Encounter (Signed)
 Pharmacy Patient Advocate Encounter  Received notification from [facility] Medicaid that Prior Authorization for  Nexletol 180MG  tablets  has been DENIED.  Full denial letter will be uploaded to the media tab. See denial reason below.    Please note that an Appeal request will be routed to our Pharmd to initiate as the pt has failed to achieve LDL. In fact the LDL has increased. We will update you with  a determination as we receive one.      PA #/Case ID/Reference #: [id number]

## 2023-11-18 ENCOUNTER — Other Ambulatory Visit (HOSPITAL_COMMUNITY): Payer: Self-pay

## 2023-11-18 ENCOUNTER — Telehealth: Payer: Self-pay | Admitting: Pharmacist

## 2023-11-18 NOTE — Telephone Encounter (Signed)
 Appeal has been submitted for Nexletol. Will advise when response is received, please be advised that most companies may take 30 days to make a decision. Appeal letter and all supporting documentation have been faxed to [phone] on 11/18/2023 @9 :58 am.  Thank you, Dellie Burns, PharmD Clinical Pharmacist  [place]  Direct Dial: [phone]

## 2023-11-24 NOTE — Telephone Encounter (Signed)
 Can this PA be renewed so I can redo the PA? It should be covered, he meets all the criteria, but I can't find the PA in CoverMy Meds

## 2023-11-25 NOTE — Telephone Encounter (Signed)
 Please see Dr Sherrie Mustache message below.

## 2023-12-01 ENCOUNTER — Ambulatory Visit: Attending: Nurse Practitioner

## 2023-12-01 DIAGNOSIS — I420 Dilated cardiomyopathy: Secondary | ICD-10-CM

## 2023-12-01 LAB — ECHOCARDIOGRAM COMPLETE
AR max vel: 2.31 cm2
AV Area VTI: 2.6 cm2
AV Area mean vel: 2.35 cm2
AV Mean grad: 5 mmHg
AV Peak grad: 9.1 mmHg
Ao pk vel: 1.51 m/s
Area-P 1/2: 2.99 cm2
Calc EF: 56 %
S' Lateral: 4.2 cm
Single Plane A2C EF: 54.5 %
Single Plane A4C EF: 56.3 %

## 2023-12-01 NOTE — Telephone Encounter (Signed)
 Insurance has approved the appeal for Nexletol:

## 2023-12-03 ENCOUNTER — Ambulatory Visit: Payer: Medicaid Other | Attending: Cardiology | Admitting: Cardiology

## 2023-12-03 ENCOUNTER — Encounter: Payer: Self-pay | Admitting: Cardiology

## 2023-12-03 VITALS — BP 130/70 | HR 67 | Ht 74.0 in | Wt 234.2 lb

## 2023-12-03 DIAGNOSIS — I5032 Chronic diastolic (congestive) heart failure: Secondary | ICD-10-CM | POA: Diagnosis not present

## 2023-12-03 DIAGNOSIS — R519 Headache, unspecified: Secondary | ICD-10-CM | POA: Diagnosis not present

## 2023-12-03 DIAGNOSIS — I4819 Other persistent atrial fibrillation: Secondary | ICD-10-CM | POA: Diagnosis not present

## 2023-12-03 DIAGNOSIS — D6869 Other thrombophilia: Secondary | ICD-10-CM | POA: Diagnosis not present

## 2023-12-03 DIAGNOSIS — G8929 Other chronic pain: Secondary | ICD-10-CM

## 2023-12-03 MED ORDER — BISOPROLOL FUMARATE 10 MG PO TABS
10.0000 mg | ORAL_TABLET | Freq: Every day | ORAL | 2 refills | Status: DC
Start: 2023-12-03 — End: 2023-12-21

## 2023-12-03 NOTE — Progress Notes (Signed)
 Electrophysiology Clinic Note    Date:  12/03/2023  Patient ID:  Stephen [name], Stephen [name] 1961-03-18, MRN [id number] PCP:  Malva Limes, MD  Cardiologist:  Lorine Bears, MD Electrophysiologist: Lanier Prude, MD   Discussed the use of AI scribe software for clinical note transcription with the patient, who gave verbal consent to proceed.   Patient Profile    Chief Complaint: 3mon post-ablation   History of Present Illness: Stephen [name] is a 63 y.o. male with PMH notable for persis AFib, atrial flutter, HFrEF, HTN, HLD, prior ETOH use; seen today for Lanier Prude, MD for routine electrophysiology followup.   He presented to [facility] ER 03/2023 in new AFib, newly diagnosed HFrEF. He was started on amiodarone at that time.   He is s/p AFib ablation with PVI, CTI on 09/04/2023. Procedure was complicated by post-procedure diffuse hives and he was admitted overnight and started on steroids and pepcid. Rash improved, so he was discharged on steroid taper.  He saw PA Fenton for 1 mon post-ablation appt and was feeling great.   On follow-up today, he continues to feel very well from cardiac perspective. He has not had any AFib episodes that he is aware of. Historically, afib was thought to be asymptomatic, but he now recalls that he did have some intermittent fluttering sensation in his chest prior to his hospitalization in July. He denies chest pain, chest pressure, palpitations. No DOE, able to lay flat. He continues amiodarone, and requests to stop it to limit off-target effects to his vision.  Prior to July hospitalization, he was on ziac (bisoprolol - hydrochlorothiazide) that significantly improved his migraines with minimal effect on BP. He is having daily headaches now with migraine aura but no migraines. He requests to restart that medication, if possible.  His wife joins for visit, who helps check BP several times a week. Most readings 130/80s   Arrhythmia/Device History AAD  - amiodarone     ROS:  Please see the history of present illness. All other systems are reviewed and otherwise negative.    Physical Exam    VS:  BP 130/70   Pulse 67   Ht 6\' 2"  (1.88 m)   Wt 234 lb 3.2 oz (106.2 kg)   SpO2 96%   BMI 30.07 kg/m  BMI: Body mass index is 30.07 kg/m.  Wt Readings from Last 3 Encounters:  12/03/23 234 lb 3.2 oz (106.2 kg)  10/23/23 247 lb (112 kg)  10/02/23 246 lb 6.4 oz (111.8 kg)     GEN- The patient is well appearing, alert and oriented x 3 today.   Lungs- Clear to ausculation bilaterally, normal work of breathing.  Heart- Regular rate and rhythm, no murmurs, rubs or gallops Extremities- No peripheral edema, warm, dry    Studies Reviewed   Previous EP, cardiology notes.    EKG is ordered. Personal review of EKG from today shows:    EKG Interpretation Date/Time:  Thursday December 03 2023 13:02:41 EDT Ventricular Rate:  64 PR Interval:  160 QRS Duration:  104 QT Interval:  414 QTC Calculation: 427 R Axis:   -2  Text Interpretation: Normal sinus rhythm Intraventricular conduction delay Confirmed by Sherie Don [phone]) on 12/03/2023 1:06:00 PM    TTE, 12/01/2023  1. Left ventricular ejection fraction, by estimation, is 55 to 60%. Left ventricular ejection fraction by 2D MOD biplane is 56.0 %. The left ventricle has normal function. The left ventricle has no regional wall motion  abnormalities. There is mild left ventricular hypertrophy. Left ventricular diastolic parameters are indeterminate.   2. Right ventricular systolic function is normal. The right ventricular size is normal.   3. The mitral valve is normal in structure. No evidence of mitral valve regurgitation.   4. The aortic valve is tricuspid. Aortic valve regurgitation is not visualized. Aortic valve sclerosis/calcification is present, without any evidence of aortic stenosis.   5. Aortic dilatation noted. There is mild dilatation of the aortic root, measuring 40 mm.   6. The  inferior vena cava is normal in size with greater than 50% respiratory variability, suggesting right atrial pressure of 3 mmHg.   Cardiac CTA, 08/20/2023 1. There is normal pulmonary vein drainage into the left atrium. (2 on the right and 2 on the left) with ostial measurements as above.  2. The left atrial appendage is a chicken wing-cactus type with ostial size 29 x 20 mm and length 38 mm, Area 43 mm2. There is no thrombus in the left atrial appendage.  3. The esophagus runs in the left atrial midline and is not in the proximity to any of the pulmonary veins.  4. Coronary calcium score 247. This is 80th percentile for age/gender.  TTE, 03/03/2023  1. Left ventricular ejection fraction, by estimation, is 20 to 25%. The left ventricle has severely decreased function. The left ventricle demonstrates regional wall motion abnormalities (see scoring diagram/findings for description). The left ventricular internal cavity size was moderately to severely dilated. There is moderate left ventricular hypertrophy. Left ventricular diastolic parameters are indeterminate.   2. Right ventricular systolic function is mildly reduced. The right ventricular size is moderately enlarged.   3. Left atrial size was mildly dilated.   4. Right atrial size was severely dilated.   5. The mitral valve is normal in structure. Mild to moderate mitral valve regurgitation.   6. The aortic valve has an indeterminant number of cusps. There is mild thickening of the aortic valve. Aortic valve regurgitation is not visualized. Aortic valve sclerosis is present, with no evidence of aortic valve stenosis.    Assessment and Plan     #) persis Afib #) atrial flutter S/p AFib, Aflutter ablation 09/2023 Maintaining sinus since procedure as best patient can tell Will stop amiodarone  We discussed monitoring strategies to ensure he remains in sinus rhythm. Specifically discussed KardiaMobile, apple or garmin watches, or implantable loop  recorder.  Will stop metoprolol and start 10mg  bisoprolol per patient request  #) Hypercoag d/t persis afib CHA2DS2-VASc Score = at least 3 [CHF History: 1, HTN History: 1, Diabetes History: 0, Stroke History: 0, Vascular Disease History: 1, Age Score: 0, Gender Score: 0].  Therefore, the patient's annual risk of stroke is 3.2 %.    Stroke ppx - 5mg  eliquis BID, appropriately dosed No bleeding concerns Continue eliquis at this time  #) HFimpEF Recent TTE with normal LVEF Continue 49-51 entresto, adjust BB as above Continue 40mg  lasix daily + potassium BID Do not favor re-starting hydrochlorothiazide at this time  #) headaches Recommend he continue to follow-up with PCP BB adjustment as above Do not favor starting hydrochlorothiazide with previously reduced LVEF, can consider if no other options for headache treatment      Current medicines are reviewed at length with the patient today.   The patient has concerns regarding his medicines.  The following changes were made today:   STOP amiodarone STOP metoprolol START bisprolol 10mg  daily  Labs/ tests ordered today include:  Orders  Placed This Encounter  Procedures   EKG 12-Lead     Disposition: Follow up with Dr. Lalla Brothers or EP APP in 6 months, sooner if needed   Signed, Sherie Don, NP  12/03/23  1:53 PM  Electrophysiology [facility]

## 2023-12-03 NOTE — Patient Instructions (Signed)
 Medication Instructions:  Stop Amiodarone. Stop Metoprolol. Start Bisoprolol 10 mg ( Take 1 Tablet Daily). *If you need a refill on your cardiac medications before your next appointment, please call your pharmacy*  Lab Work: No Labs If you have labs (blood work) drawn today and your tests are completely normal, you will receive your results only by: MyChart Message (if you have MyChart) OR A paper copy in the mail If you have any lab test that is abnormal or we need to change your treatment, we will call you to review the results.  Testing/Procedures: No Testing  Follow-Up: At [facility], you and your health needs are our priority.  As part of our continuing mission to provide you with exceptional heart care, our providers are all part of one team.  This team includes your primary Cardiologist (physician) and Advanced Practice Providers or APPs (Physician Assistants and Nurse Practitioners) who all work together to provide you with the care you need, when you need it.  Your next appointment:   6 month(s)  Provider:   Steffanie Dunn, MD    We recommend signing up for the patient portal called "MyChart".  Sign up information is provided on this After Visit Summary.  MyChart is used to connect with patients for Virtual Visits (Telemedicine).  Patients are able to view lab/test results, encounter notes, upcoming appointments, etc.  Non-urgent messages can be sent to your provider as well.   To learn more about what you can do with MyChart, go to [email or web address].

## 2023-12-07 NOTE — Telephone Encounter (Signed)
 Called and was able to inform the pt, per his insurance he has been approved for [facility], he verbally stated he understood

## 2023-12-10 ENCOUNTER — Telehealth: Payer: Self-pay | Admitting: Pharmacy Technician

## 2023-12-10 NOTE — Telephone Encounter (Signed)
 Pharmacy Patient Advocate Encounter   Received notification from CoverMyMeds that prior authorization for bisoprolol  is required/requested.   Insurance verification completed.   The patient is insured through [facility] .   Per test claim: PA required; PA submitted to above mentioned insurance via CoverMyMeds Key/confirmation #/EOC [id number] Status is pending

## 2023-12-11 MED ORDER — BEMPEDOIC ACID 180 MG PO TABS: 180.0000 mg | ORAL_TABLET | Freq: Every day | ORAL | 5 refills | Status: DC

## 2023-12-11 NOTE — Addendum Note (Signed)
 Addended by: Malva Limes on: 12/11/2023 12:35 PM   Modules accepted: Orders

## 2023-12-11 NOTE — Telephone Encounter (Signed)
 Pt made aware via Casimer Leek, NP  You; Darene Lamer, LPN1 hour ago (9:48 AM)    Please call patient with this information. He had requested to start bisoprolol because it previously helped with his headaches. My recommendation would be to trial a different BB on the approved list (already tried metop), and then if he fails that medication, insurance should approve bisoprolol based on this denial info.   Thanks, [employer]

## 2023-12-11 NOTE — Telephone Encounter (Signed)
 Pharmacy Patient Advocate Encounter  Received notification from [facility] that Prior Authorization for bisoprolol has been DENIED.  Full denial letter will be uploaded to the media tab. See denial reason below.   PA #/Case ID/Reference #:  [id number]

## 2023-12-12 DIAGNOSIS — Z419 Encounter for procedure for purposes other than remedying health state, unspecified: Secondary | ICD-10-CM | POA: Diagnosis not present

## 2023-12-19 ENCOUNTER — Encounter: Payer: Self-pay | Admitting: Family Medicine

## 2023-12-21 MED ORDER — ATENOLOL 50 MG PO TABS: ORAL_TABLET | ORAL | 2 refills | Status: DC

## 2023-12-21 NOTE — Telephone Encounter (Signed)
Please see the message below and advise.

## 2023-12-21 NOTE — Telephone Encounter (Signed)
 RX sent to pharmacy below.   Thanks!

## 2023-12-23 ENCOUNTER — Ambulatory Visit: Payer: Medicaid Other | Attending: Nurse Practitioner | Admitting: Nurse Practitioner

## 2023-12-23 ENCOUNTER — Encounter: Payer: Self-pay | Admitting: Nurse Practitioner

## 2023-12-23 VITALS — BP 120/62 | HR 55 | Ht 74.0 in | Wt 235.1 lb

## 2023-12-23 DIAGNOSIS — E785 Hyperlipidemia, unspecified: Secondary | ICD-10-CM

## 2023-12-23 DIAGNOSIS — I483 Typical atrial flutter: Secondary | ICD-10-CM | POA: Diagnosis not present

## 2023-12-23 DIAGNOSIS — I10 Essential (primary) hypertension: Secondary | ICD-10-CM | POA: Diagnosis not present

## 2023-12-23 DIAGNOSIS — I4819 Other persistent atrial fibrillation: Secondary | ICD-10-CM | POA: Diagnosis not present

## 2023-12-23 DIAGNOSIS — I5032 Chronic diastolic (congestive) heart failure: Secondary | ICD-10-CM

## 2023-12-23 DIAGNOSIS — I420 Dilated cardiomyopathy: Secondary | ICD-10-CM | POA: Diagnosis not present

## 2023-12-23 DIAGNOSIS — F1010 Alcohol abuse, uncomplicated: Secondary | ICD-10-CM

## 2023-12-23 MED ORDER — ATENOLOL 100 MG PO TABS: 100.0000 mg | ORAL_TABLET | Freq: Every day | ORAL | 1 refills | Status: DC

## 2023-12-23 NOTE — Patient Instructions (Signed)
 Medication Instructions:  No changes *If you need a refill on your cardiac medications before your next appointment, please call your pharmacy*  Lab Work: None ordered If you have labs (blood work) drawn today and your tests are completely normal, you will receive your results only by: MyChart Message (if you have MyChart) OR A paper copy in the mail If you have any lab test that is abnormal or we need to change your treatment, we will call you to review the results.  Testing/Procedures: None ordered  Follow-Up: At [facility], you and your health needs are our priority.  As part of our continuing mission to provide you with exceptional heart care, our providers are all part of one team.  This team includes your primary Cardiologist (physician) and Advanced Practice Providers or APPs (Physician Assistants and Nurse Practitioners) who all work together to provide you with the care you need, when you need it.  Your next appointment:   September with Dr. Marven Slimmer

## 2023-12-23 NOTE — Progress Notes (Signed)
 Office Visit    Patient Name: Stephen [name] Date of Encounter: 12/23/2023  Primary Care Provider:  Lamon Pillow, MD Primary Cardiologist:  Antionette Kirks, MD  Chief Complaint    63 y.o. male with a history of persistent atrial fibrillation, typical atrial flutter, cardiomyopathy (EF 20 to 25% July 2024), HFimpEF, hypertension, hyperlipidemia, moderate mitral regurgitation, chronic back pain, anxiety, and alcohol use who presents for follow-up related to afib.  Past Medical History  Subjective   Past Medical History:  Diagnosis Date   Alcohol abuse    a. Significant reduction following Afib/cardiomyopathy diagnosis in July 2024.   Aortic root dilatation (HCC)    a. 12/2023 Echo: Ao root 40mm.   Aortic valve sclerosis    Atrial flutter (HCC)    a. 09/2023 s/p RFCA.   Cardiomyopathy (HCC)    a. 03/2023 Echo: EF 20-25%, sev dil LV, mod LVH, mildly reduced RV fxn, mildly dil LA, sev dil RA, mild-mod MR. Ao sclerosis; b. 03/2023 TEE: EF 20-25%, glob HK, no LAA thrombus, mod MR, mod TR, Ao sclerosis; b. 12/2023 Echo: EF 55-60%, no rwma, mild LVH, nl RV fxn, no MR, Ao root 40mm.   Coronary artery calcification seen on CT scan    a. 08/2023 Cardiac CT: Ca2+ = 247 (80th percentile).   Heart failure with improved ejection fraction (HFimpEF) (HCC)    03/2023 Echo: EF 20-25%, mildly reduced RV fxn, mild-mod MR; b. 12/2023 Echo: EF 55-60%, no rwma.   Hyperlipidemia    Hypertension    Moderate mitral regurgitation    a. 03/2023 TEE: Mod MR.   Persistent atrial fibrillation (HCC)    a. 03/2023 s/p TEE/DCCV; b. 05/2023 s/p DCCV; c. 09/2023 s/p PVI.   Statin intolerance    a. myalgias - atorva/rosuvastatin .   Past Surgical History:  Procedure Laterality Date   ATRIAL FIBRILLATION ABLATION N/A 09/04/2023   Procedure: ATRIAL FIBRILLATION ABLATION;  Surgeon: Boyce Byes, MD;  Location: [facility] INVASIVE CV LAB;  Service: Cardiovascular;  Laterality: N/A;   CARDIOVERSION N/A 03/06/2023    Procedure: CARDIOVERSION;  Surgeon: Devorah Fonder, MD;  Location: [facility] ORS;  Service: Cardiovascular;  Laterality: N/A;   CARDIOVERSION N/A 05/05/2023   Procedure: CARDIOVERSION;  Surgeon: Devorah Fonder, MD;  Location: [facility] ORS;  Service: Cardiovascular;  Laterality: N/A;   COLONOSCOPY  10/31/2013   TEE WITHOUT CARDIOVERSION N/A 03/06/2023   Procedure: TRANSESOPHAGEAL ECHOCARDIOGRAM;  Surgeon: Devorah Fonder, MD;  Location: [facility] ORS;  Service: Cardiovascular;  Laterality: N/A;   TONSILLECTOMY AND ADENOIDECTOMY      Allergies  Allergies  Allergen Reactions   Atorvastatin  Other (See Comments)    Muscle pain      History of Present Illness      63 y.o. y/o male with a history of persistent atrial fibrillation, typical atrial flutter, cardiomyopathy (EF 20 to 25% July 2024), HFimpEF, hypertension, hyperlipidemia, moderate mitral regurgitation, chronic back pain, anxiety, and alcohol use.  He was admitted to the [facility] in July 2024 in the setting of heart failure and rapid A-fib.  Echo showed an EF of 20 to 25% with global hypokinesis and moderate MR.  He underwent TEE and cardioversion, and subsequently placed on amiodarone .  At return office visit in August 2024, he was noted to be in atrial flutter and underwent repeat cardioversion in September 2024.  He established care with Dr. Marven Slimmer in November 2024 at which time he was maintaining sinus rhythm.  He subsequently underwent  A-fib ablation/pulmonary vein isolation and flutter ablation in January 2025.  he   [name] was last seen in general cardiology clinic in late January 2025, at which time he was feeling well and maintaining sinus rhythm.  In the setting of an elevated coronary calcium  on CT in December 2024 (247-80th percentile), low-dose rosuvastatin  was added.  He noted prior history of myalgias on atorvastatin .  Follow-up echo in early April 2025 showed improvement in EF to 55-60% without regional wall  motion abnormalities, mild LVH, resolution of mitral regurgitation, and mildly dilated aortic root at 40 mm.  At electrophysiology follow-up on April 3, he reported frequent headaches which he previously managed with bisoprolol .  Amiodarone  and metoprolol  were discontinued and bisoprolol  resumed however, he has had trouble getting this due to insurance issues and he was instead placed on atenolol .  Mr. Haroon notes that he has been feeling well.  Since coming off of statin therapy, myalgias have resolved.  His primary care provider ordered bempedoic acid  and has received prior authorization.  He just needs to go pick it up at his pharmacy.  He thinks atenolol  is helping his headaches though they have not completely resolved.  He is willing to try it for a little bit longer.  He is not sure why there is a hold-up regarding bisoprolol  as he was on this previously.  He denies chest pain, dyspnea, palpitations, PND, orthopnea, dizziness, syncope, edema, or early satiety. Objective  Home Medications    Current Outpatient Medications  Medication Sig Dispense Refill   apixaban  (ELIQUIS ) 5 MG TABS tablet Take 1 tablet (5 mg total) by mouth 2 (two) times daily. 60 tablet 1   Bempedoic Acid  180 MG TABS Take 1 tablet (180 mg total) by mouth daily. 30 tablet 5   clonazePAM  (KLONOPIN ) 0.5 MG tablet Take 1 tablet (0.5 mg total) by mouth 2 (two) times daily as needed for anxiety. 60 tablet 2   folic acid  (FOLVITE ) 1 MG tablet TAKE ONE TABLET (1 MG TOTAL) BY MOUTH DAILY. 90 tablet 1   furosemide  (LASIX ) 40 MG tablet Take 1 tablet (40 mg total) by mouth 2 (two) times daily. 60 tablet 5   gabapentin  (NEURONTIN ) 600 MG tablet Take 1 tablet (600 mg total) by mouth 3 (three) times daily as needed. 90 tablet 11   niacin 500 MG tablet Take 500 mg by mouth daily.     OMEGA-3 FATTY ACIDS PO Take 1,000 mg by mouth daily.     polyethylene glycol (MIRALAX  / GLYCOLAX ) 17 g packet Take 17 g by mouth daily as needed for mild  constipation. 14 each 0   potassium chloride  SA (KLOR-CON  M) 20 MEQ tablet Take 1 tablet (20 mEq total) by mouth 2 (two) times daily. 60 tablet 5   sacubitril -valsartan  (ENTRESTO ) 49-51 MG TAKE ONE TABLET BY MOUTH TWO  TIMES DAILY. 180 tablet 1   thiamine  (VITAMIN B-1) 100 MG tablet Take 1 tablet (100 mg total) by mouth daily. 30 tablet 0   atenolol  (TENORMIN ) 100 MG tablet Take 1 tablet (100 mg total) by mouth daily. 90 tablet 1   No current facility-administered medications for this visit.     Physical Exam    VS:  BP 120/62 (BP Location: Left Arm, Patient Position: Sitting, Cuff Size: Normal)   Pulse (!) 55   Ht 6\' 2"  (1.88 m)   Wt 235 lb 2 oz (106.7 kg)   SpO2 97%   BMI 30.19 kg/m  , BMI Body mass index  is 30.19 kg/m.       GEN: Well nourished, well developed, in no acute distress. HEENT: normal. Neck: Supple, no JVD, carotid bruits, or masses. Cardiac: RRR, no murmurs, rubs, or gallops. No clubbing, cyanosis, edema.  Radials 2+/PT 2+ and equal bilaterally.  Respiratory:  Respirations regular and unlabored, clear to auscultation bilaterally. GI: Soft, nontender, nondistended, BS + x 4. MS: no deformity or atrophy. Skin: warm and dry, no rash. Neuro:  Strength and sensation are intact. Psych: Normal affect.  Accessory Clinical Findings    ECG personally reviewed by me today - EKG Interpretation Date/Time:  Wednesday December 23 2023 08:16:18 EDT Ventricular Rate:  55 PR Interval:  178 QRS Duration:  110 QT Interval:  442 QTC Calculation: 422 R Axis:   -30  Text Interpretation: Sinus bradycardia Left axis deviation Nonspecific T wave abnormality Confirmed by Laneta Pintos [phone] on 12/23/2023 8:22:40 AM  - no acute changes.  Lab Results  Component Value Date   WBC 5.4 10/23/2023   HGB 15.1 10/23/2023   HCT 44.2 10/23/2023   MCV 88 10/23/2023   PLT 164 10/23/2023   Lab Results  Component Value Date   CREATININE 0.98 10/23/2023   BUN 12 10/23/2023   NA 145  (H) 10/23/2023   K 4.1 10/23/2023   CL 105 10/23/2023   CO2 24 10/23/2023   Lab Results  Component Value Date   ALT 21 10/23/2023   AST 17 10/23/2023   ALKPHOS 65 10/23/2023   BILITOT 0.4 10/23/2023   Lab Results  Component Value Date   CHOL 243 (H) 10/23/2023   HDL 52 10/23/2023   LDLCALC 168 (H) 10/23/2023   LDLDIRECT 84 03/27/2023   TRIG 130 10/23/2023   CHOLHDL 4.7 10/23/2023    Lab Results  Component Value Date   TSH 2.010 08/19/2023       Assessment & Plan    1.  Persistent atrial fibrillation: Status post PVI in January 2025.  Doing well without palpitations or known recurrent fibrillation.  Remains in sinus rhythm today.  Amiodarone  previously discontinued.  Currently on atenolol  as he is unable to get bisoprolol  through his insurance.  He previously used bisoprolol  for headaches.  Still having some headaches though wants to continue to try atenolol  as he is otherwise tolerating.  He remains anticoagulated with Eliquis  with normal H&H in February.  2.  Typical atrial flutter: Noted in August 2024 status post cardioversion in September 2024 followed by catheter ablation in January 2025.  Maintaining sinus rhythm.  Anticoagulate with Eliquis  as outlined above.  3.  Cardiomyopathy/chronic heart failure with improved ejection fraction: EF 20 to 25% in the setting of atrial fibrillation in July 2024.  More recent echo in the setting of sinus rhythm in early April shows normal LV function with resolution of mitral regurgitation as well.  He continues to tolerate beta-blocker and Entresto  and is euvolemic on examination.  As outlined above, beta-blocker choice based on utility of reducing headaches-currently on atenolol .  4.  Primary hypertension: Stable on beta-blocker, Entresto , and diuretic therapy.  5.  Coronary calcium /hyperlipidemia: Noted on CT in December 2024 with a calcium  score of 247 (80th percentile).  We added low-dose rosuvastatin  but this ended up causing  myalgias.  He previously had similar symptoms with atorvastatin .  He is now prescribed bempedoic acid  through his primary care provider and he is going to his pharmacy to pick it up.  I suspect he will require addition of Zetia at some point in  the future and or PCSK9 inhibitor given LDL of 168 in February.  6.  Alcohol abuse: Cut back from a 12 pack of beer to 1 a day.  Complete cessation advised.  He is aware that this may contribute to additional atrial arrhythmias.  7.  Moderate mitral regurgitation: Previously noted on echocardiogram in the setting of rapid A-fib.  On follow-up echo in early April, no MR was noted.  8.  Disposition: He will follow-up with Dr. Marven Slimmer in 4 months.  Laneta Pintos, NP 12/23/2023, 1:13 PM

## 2024-01-11 DIAGNOSIS — Z419 Encounter for procedure for purposes other than remedying health state, unspecified: Secondary | ICD-10-CM | POA: Diagnosis not present

## 2024-01-13 ENCOUNTER — Other Ambulatory Visit: Payer: Self-pay | Admitting: Family Medicine

## 2024-01-13 DIAGNOSIS — I4819 Other persistent atrial fibrillation: Secondary | ICD-10-CM

## 2024-01-19 ENCOUNTER — Other Ambulatory Visit: Payer: Self-pay | Admitting: Family Medicine

## 2024-01-19 DIAGNOSIS — F439 Reaction to severe stress, unspecified: Secondary | ICD-10-CM

## 2024-02-11 DIAGNOSIS — Z419 Encounter for procedure for purposes other than remedying health state, unspecified: Secondary | ICD-10-CM | POA: Diagnosis not present

## 2024-02-19 ENCOUNTER — Other Ambulatory Visit: Payer: Self-pay | Admitting: Family Medicine

## 2024-03-12 DIAGNOSIS — Z419 Encounter for procedure for purposes other than remedying health state, unspecified: Secondary | ICD-10-CM | POA: Diagnosis not present

## 2024-04-12 DIAGNOSIS — Z419 Encounter for procedure for purposes other than remedying health state, unspecified: Secondary | ICD-10-CM | POA: Diagnosis not present

## 2024-04-14 ENCOUNTER — Other Ambulatory Visit: Payer: Self-pay | Admitting: Family Medicine

## 2024-04-14 DIAGNOSIS — I5022 Chronic systolic (congestive) heart failure: Secondary | ICD-10-CM

## 2024-05-13 DIAGNOSIS — Z419 Encounter for procedure for purposes other than remedying health state, unspecified: Secondary | ICD-10-CM | POA: Diagnosis not present

## 2024-05-17 NOTE — Progress Notes (Unsigned)
  Electrophysiology Office Follow up Visit Note:    Date:  05/18/2024   ID:  Stephen [name], DOB Jul 17, 1961, MRN [id number]  PCP:  [name], MD  [facility] Cardiologist:  Deatrice Cage, MD  [facility] Electrophysiologist:  OLE ONEIDA HOLTS, MD    Interval History:     Stephen [name] is a 63 y.o. male who presents for a follow up visit.   The patient was last seen by [facility] December 03, 2023.  Has a history of persistent atrial fibrillation flutter, chronic systolic heart failure, hypertension, hyperlipidemia, prior alcohol use.  The patient had an A-fib and flutter ablation on September 04, 2023.  The patient had hives following the procedure and was admitted for steroids and Pepcid .  At the appointment with [facility] April 3, the patient was maintaining normal rhythm.  Amiodarone  was stopped.  He is with his wife today in clinic.  He has not had any episodes of atrial fibrillation.  He reports intermittent bright sensations in his vision.  He is unsure what is causing them.  They have gotten less frequent since stopping the amiodarone  so he wonders whether or not the amiodarone  could be the culprit.  He continues to have frequent headaches but not a full-blown migraine.  He also describes intermittent lightheadedness when he stands up has been more prominent since starting the atenolol .       Past medical, surgical, social and family history were reviewed.  ROS:   Please see the history of present illness.    All other systems reviewed and are negative.  EKGs/Labs/Other Studies Reviewed:    The following studies were reviewed today:     EKG Interpretation Date/Time:  Wednesday May 18 2024 09:36:47 EDT Ventricular Rate:  62 PR Interval:  156 QRS Duration:  94 QT Interval:  398 QTC Calculation: 403 R Axis:   -42  Text Interpretation: Normal sinus rhythm with sinus arrhythmia Confirmed by HOLTS OLE [phone]) on 05/18/2024 9:38:57 AM    Physical Exam:     VS:  BP (!) 154/94 (BP Location: Left Arm, Patient Position: Sitting, Cuff Size: Large)   Pulse 62   Ht 6' 2 (1.88 m)   Wt 227 lb 4 oz (103.1 kg)   SpO2 99%   BMI 29.18 kg/m     Wt Readings from Last 3 Encounters:  05/18/24 227 lb 4 oz (103.1 kg)  12/23/23 235 lb 2 oz (106.7 kg)  12/03/23 234 lb 3.2 oz (106.2 kg)     GEN: no distress CARD: RRR, No MRG RESP: No IWOB. CTAB.      ASSESSMENT:    1. Persistent atrial fibrillation (HCC)   2. Chronic HFrEF (heart failure with reduced ejection fraction) (HCC)    PLAN:    In order of problems listed above:  #Persistent atrial fibrillation Maintaining sinus rhythm after prior catheter ablation.  On Eliquis  for stroke prophylaxis.  Off amiodarone .  #Chronic systolic heart failure NYHA class II.  Warm and dry on exam.  Continue Entresto , Lasix , atenolol . Reduce dose of atenolol  to 75 mg by mouth once daily  Follow-up 6 months with APP     Signed, OLE HOLTS, MD, [facility] 05/18/2024 9:39 AM    Electrophysiology [facility]

## 2024-05-18 ENCOUNTER — Ambulatory Visit: Admitting: Cardiology

## 2024-05-18 ENCOUNTER — Encounter: Payer: Self-pay | Admitting: Cardiology

## 2024-05-18 ENCOUNTER — Other Ambulatory Visit: Payer: Self-pay | Admitting: Cardiovascular Disease

## 2024-05-18 ENCOUNTER — Ambulatory Visit: Attending: Cardiology | Admitting: Cardiology

## 2024-05-18 VITALS — BP 154/94 | HR 62 | Ht 74.0 in | Wt 227.2 lb

## 2024-05-18 DIAGNOSIS — I5022 Chronic systolic (congestive) heart failure: Secondary | ICD-10-CM | POA: Diagnosis not present

## 2024-05-18 DIAGNOSIS — I4819 Other persistent atrial fibrillation: Secondary | ICD-10-CM | POA: Insufficient documentation

## 2024-05-18 MED ORDER — ATENOLOL 50 MG PO TABS: 75.0000 mg | ORAL_TABLET | Freq: Every day | ORAL | 3 refills | Status: AC

## 2024-05-18 NOTE — Patient Instructions (Signed)
 Medication Instructions:  Your physician has recommended you make the following change in your medication:  1) DECREASE atenolol  to 75 mg once daily *If you need a refill on your cardiac medications before your next appointment, please call your pharmacy*  Follow-Up: At [facility], you and your health needs are our priority.  As part of our continuing mission to provide you with exceptional heart care, our providers are all part of one team.  This team includes your primary Cardiologist (physician) and Advanced Practice Providers or APPs (Physician Assistants and Nurse Practitioners) who all work together to provide you with the care you need, when you need it.  Your next appointment:   6 months  Provider:   Suzann Riddle, NP

## 2024-06-12 DIAGNOSIS — Z419 Encounter for procedure for purposes other than remedying health state, unspecified: Secondary | ICD-10-CM | POA: Diagnosis not present

## 2024-06-16 ENCOUNTER — Encounter: Payer: Self-pay | Admitting: Family Medicine

## 2024-06-16 NOTE — Telephone Encounter (Signed)
 Can try OTC nasal steroid such as Flonase or Nasacort. If not effective then needs appointment.

## 2024-06-20 ENCOUNTER — Other Ambulatory Visit (HOSPITAL_COMMUNITY): Payer: Self-pay

## 2024-06-20 ENCOUNTER — Telehealth: Payer: Self-pay

## 2024-06-20 NOTE — Telephone Encounter (Signed)
 Pharmacy Patient Advocate Encounter  Received notification from [employer] that Prior Authorization for Nexletol  180MG  tablets  has been DENIED.  See denial reason below. No denial letter attached in CMM. Will attach denial letter to Media tab once received.   PA #/Case ID/Reference #: [id number]

## 2024-06-20 NOTE — Telephone Encounter (Signed)
 Pharmacy Patient Advocate Encounter   Received notification from Onbase that prior authorization for Nexletol  180MG  tablets  is required/requested.   Insurance verification completed.   The patient is insured through [facility].   Per test claim: PA required; PA submitted to above mentioned insurance via Latent Key/confirmation #/EOC [id number] Status is pending

## 2024-06-23 NOTE — Telephone Encounter (Signed)
 This should be covered for coronary artery disease/atherosclerotic cardiovascular disease. Was originally approved for that indication in April. I am unable to find the case in CoverMyMeds

## 2024-06-29 ENCOUNTER — Telehealth: Payer: Self-pay | Admitting: Pharmacist

## 2024-06-29 NOTE — Telephone Encounter (Signed)
 Appeal has been submitted. Will advise when response is received, please be advised that most companies may take 30 days to make a decision. Appeal letter and supporting clinical documentation have been faxed to [phone] on 10/29/205 @4 :07pm.  Thank you, Devere Pandy, PharmD Clinical Pharmacist  [place]  Direct Dial: [phone]

## 2024-07-12 ENCOUNTER — Other Ambulatory Visit (HOSPITAL_COMMUNITY): Payer: Self-pay

## 2024-07-12 NOTE — Telephone Encounter (Signed)
 Appeal for Nexletol  has been approved by the insurance through 06/30/2025.    Thank you, Devere Pandy, PharmD Clinical Pharmacist  [place]  Direct Dial: [phone]

## 2024-07-13 DIAGNOSIS — Z419 Encounter for procedure for purposes other than remedying health state, unspecified: Secondary | ICD-10-CM | POA: Diagnosis not present

## 2024-07-14 ENCOUNTER — Other Ambulatory Visit: Payer: Self-pay | Admitting: Family Medicine

## 2024-07-14 DIAGNOSIS — F439 Reaction to severe stress, unspecified: Secondary | ICD-10-CM

## 2024-08-18 ENCOUNTER — Other Ambulatory Visit: Payer: Self-pay | Admitting: Family Medicine

## 2024-08-18 DIAGNOSIS — F439 Reaction to severe stress, unspecified: Secondary | ICD-10-CM

## 2024-09-05 ENCOUNTER — Other Ambulatory Visit: Payer: Self-pay | Admitting: Family Medicine

## 2024-09-05 DIAGNOSIS — F439 Reaction to severe stress, unspecified: Secondary | ICD-10-CM

## 2024-09-14 ENCOUNTER — Other Ambulatory Visit: Payer: Self-pay | Admitting: Family Medicine

## 2024-09-15 ENCOUNTER — Other Ambulatory Visit: Payer: Self-pay | Admitting: Family Medicine

## 2024-09-15 DIAGNOSIS — F439 Reaction to severe stress, unspecified: Secondary | ICD-10-CM

## 2024-09-23 ENCOUNTER — Ambulatory Visit (INDEPENDENT_AMBULATORY_CARE_PROVIDER_SITE_OTHER): Admitting: Family Medicine

## 2024-09-23 VITALS — BP 131/75 | HR 55 | Ht 74.0 in | Wt 225.0 lb

## 2024-09-23 DIAGNOSIS — I2510 Atherosclerotic heart disease of native coronary artery without angina pectoris: Secondary | ICD-10-CM | POA: Diagnosis not present

## 2024-09-23 DIAGNOSIS — R0981 Nasal congestion: Secondary | ICD-10-CM

## 2024-09-23 DIAGNOSIS — E782 Mixed hyperlipidemia: Secondary | ICD-10-CM | POA: Diagnosis not present

## 2024-09-23 DIAGNOSIS — Z860101 Personal history of adenomatous and serrated colon polyps: Secondary | ICD-10-CM | POA: Diagnosis not present

## 2024-09-23 DIAGNOSIS — I4819 Other persistent atrial fibrillation: Secondary | ICD-10-CM | POA: Diagnosis not present

## 2024-09-23 DIAGNOSIS — I483 Typical atrial flutter: Secondary | ICD-10-CM

## 2024-09-23 DIAGNOSIS — Z125 Encounter for screening for malignant neoplasm of prostate: Secondary | ICD-10-CM | POA: Diagnosis not present

## 2024-09-23 DIAGNOSIS — D6869 Other thrombophilia: Secondary | ICD-10-CM | POA: Diagnosis not present

## 2024-09-23 DIAGNOSIS — I10 Essential (primary) hypertension: Secondary | ICD-10-CM | POA: Diagnosis not present

## 2024-09-23 DIAGNOSIS — G720 Drug-induced myopathy: Secondary | ICD-10-CM | POA: Diagnosis not present

## 2024-09-23 DIAGNOSIS — I5022 Chronic systolic (congestive) heart failure: Secondary | ICD-10-CM | POA: Diagnosis not present

## 2024-09-23 MED ORDER — LEVOCETIRIZINE DIHYDROCHLORIDE 5 MG PO TABS: 5.0000 mg | ORAL_TABLET | Freq: Every evening | ORAL | 3 refills | Status: AC

## 2024-09-23 MED ORDER — MONTELUKAST SODIUM 10 MG PO TABS: 10.0000 mg | ORAL_TABLET | Freq: Every day | ORAL | 3 refills | Status: AC

## 2024-09-23 NOTE — Patient Instructions (Signed)
 [name]  Please review the attached list of medications and notify my office if there are any errors.   . Please bring all of your medications to every appointment so we can make sure that our medication list is the same as yours.
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
## 2024-09-24 LAB — LIPID PANEL
Chol/HDL Ratio: 4.6 ratio (ref 0.0–5.0)
Cholesterol, Total: 223 mg/dL — ABNORMAL HIGH (ref 100–199)
HDL: 48 mg/dL
LDL Chol Calc (NIH): 157 mg/dL — ABNORMAL HIGH (ref 0–99)
Triglycerides: 99 mg/dL (ref 0–149)
VLDL Cholesterol Cal: 18 mg/dL (ref 5–40)

## 2024-09-24 LAB — CBC
Hematocrit: 42.1 % (ref 37.5–51.0)
Hemoglobin: 14.1 g/dL (ref 13.0–17.7)
MCH: 28.5 pg (ref 26.6–33.0)
MCHC: 33.5 g/dL (ref 31.5–35.7)
MCV: 85 fL (ref 79–97)
Platelets: 182 10*3/uL (ref 150–450)
RBC: 4.94 x10E6/uL (ref 4.14–5.80)
RDW: 11.8 % (ref 11.6–15.4)
WBC: 6.5 10*3/uL (ref 3.4–10.8)

## 2024-09-24 LAB — COMPREHENSIVE METABOLIC PANEL WITH GFR
ALT: 18 [IU]/L (ref 0–44)
AST: 24 [IU]/L (ref 0–40)
Albumin: 4.5 g/dL (ref 3.9–4.9)
Alkaline Phosphatase: 72 [IU]/L (ref 47–123)
BUN/Creatinine Ratio: 17 (ref 10–24)
BUN: 15 mg/dL (ref 8–27)
Bilirubin Total: 0.3 mg/dL (ref 0.0–1.2)
CO2: 23 mmol/L (ref 20–29)
Calcium: 10 mg/dL (ref 8.6–10.2)
Chloride: 108 mmol/L — ABNORMAL HIGH (ref 96–106)
Creatinine, Ser: 0.88 mg/dL (ref 0.76–1.27)
Globulin, Total: 2.5 g/dL (ref 1.5–4.5)
Glucose: 101 mg/dL — ABNORMAL HIGH (ref 70–99)
Potassium: 4.5 mmol/L (ref 3.5–5.2)
Sodium: 150 mmol/L — ABNORMAL HIGH (ref 134–144)
Total Protein: 7 g/dL (ref 6.0–8.5)
eGFR: 97 mL/min/{1.73_m2}

## 2024-09-24 LAB — PSA TOTAL (REFLEX TO FREE): Prostate Specific Ag, Serum: 1.3 ng/mL (ref 0.0–4.0)

## 2024-09-27 ENCOUNTER — Other Ambulatory Visit: Payer: Self-pay | Admitting: Family Medicine

## 2024-09-27 ENCOUNTER — Ambulatory Visit: Payer: Self-pay | Admitting: Family Medicine

## 2024-09-27 DIAGNOSIS — E782 Mixed hyperlipidemia: Secondary | ICD-10-CM

## 2024-09-27 DIAGNOSIS — I5022 Chronic systolic (congestive) heart failure: Secondary | ICD-10-CM

## 2024-09-27 MED ORDER — BEMPEDOIC ACID 180 MG PO TABS: 180.0000 mg | ORAL_TABLET | Freq: Every day | ORAL | 5 refills | Status: AC

## 2024-09-28 NOTE — Progress Notes (Signed)
 Called [facility] and spoke with staff, addition test has been ordered, test # [id number]. For Osmotality (plasma)

## 2024-09-29 LAB — OSMOLALITY: Osmolality Meas: 287 mosm/kg (ref 280–301)

## 2024-09-29 LAB — SPECIMEN STATUS REPORT

## 2024-11-15 ENCOUNTER — Ambulatory Visit: Admitting: Cardiology
